# Patient Record
Sex: Female | Born: 1970 | Race: White | Hispanic: No | Smoking: Never smoker
Health system: Southern US, Community
[De-identification: ages and names within clinical notes are randomized; demographics above are authoritative.]

## PROBLEM LIST (undated history)

## (undated) DIAGNOSIS — G4733 Obstructive sleep apnea (adult) (pediatric): Principal | ICD-10-CM

## (undated) DIAGNOSIS — D219 Benign neoplasm of connective and other soft tissue, unspecified: Secondary | ICD-10-CM

## (undated) DIAGNOSIS — Z8489 Family history of other specified conditions: Secondary | ICD-10-CM

## (undated) DIAGNOSIS — Z87442 Personal history of urinary calculi: Secondary | ICD-10-CM

## (undated) DIAGNOSIS — J189 Pneumonia, unspecified organism: Secondary | ICD-10-CM

## (undated) DIAGNOSIS — K219 Gastro-esophageal reflux disease without esophagitis: Secondary | ICD-10-CM

## (undated) DIAGNOSIS — M069 Rheumatoid arthritis, unspecified: Secondary | ICD-10-CM

## (undated) DIAGNOSIS — I499 Cardiac arrhythmia, unspecified: Secondary | ICD-10-CM

## (undated) DIAGNOSIS — J309 Allergic rhinitis, unspecified: Secondary | ICD-10-CM

## (undated) DIAGNOSIS — R002 Palpitations: Secondary | ICD-10-CM

## (undated) DIAGNOSIS — E785 Hyperlipidemia, unspecified: Secondary | ICD-10-CM

## (undated) DIAGNOSIS — G43909 Migraine, unspecified, not intractable, without status migrainosus: Secondary | ICD-10-CM

## (undated) DIAGNOSIS — R55 Syncope and collapse: Secondary | ICD-10-CM

## (undated) DIAGNOSIS — J45909 Unspecified asthma, uncomplicated: Secondary | ICD-10-CM

## (undated) DIAGNOSIS — D649 Anemia, unspecified: Secondary | ICD-10-CM

## (undated) HISTORY — DX: Cardiac arrhythmia, unspecified: I49.9

## (undated) HISTORY — DX: Hyperlipidemia, unspecified: E78.5

## (undated) HISTORY — DX: Palpitations: R00.2

## (undated) HISTORY — DX: Obstructive sleep apnea (adult) (pediatric): G47.33

## (undated) HISTORY — DX: Allergic rhinitis, unspecified: J30.9

## (undated) HISTORY — DX: Gastro-esophageal reflux disease without esophagitis: K21.9

## (undated) HISTORY — DX: Syncope and collapse: R55

## (undated) HISTORY — DX: Unspecified asthma, uncomplicated: J45.909

## (undated) HISTORY — PX: WISDOM TOOTH EXTRACTION: SHX21

## (undated) HISTORY — PX: UTERINE FIBROID EMBOLIZATION: SHX825

## (undated) HISTORY — DX: Migraine, unspecified, not intractable, without status migrainosus: G43.909

---

## 1998-12-24 DIAGNOSIS — R519 Headache, unspecified: Secondary | ICD-10-CM | POA: Insufficient documentation

## 1999-01-18 DIAGNOSIS — G43019 Migraine without aura, intractable, without status migrainosus: Secondary | ICD-10-CM | POA: Insufficient documentation

## 2004-05-16 DIAGNOSIS — E119 Type 2 diabetes mellitus without complications: Secondary | ICD-10-CM | POA: Insufficient documentation

## 2012-08-19 ENCOUNTER — Encounter: Payer: Self-pay | Admitting: Internal Medicine

## 2012-08-19 ENCOUNTER — Ambulatory Visit (INDEPENDENT_AMBULATORY_CARE_PROVIDER_SITE_OTHER): Payer: BC Managed Care – PPO | Admitting: Internal Medicine

## 2012-08-19 VITALS — BP 120/72 | HR 68 | Temp 99.0°F | Ht 62.0 in | Wt 174.6 lb

## 2012-08-19 DIAGNOSIS — K219 Gastro-esophageal reflux disease without esophagitis: Secondary | ICD-10-CM | POA: Insufficient documentation

## 2012-08-19 DIAGNOSIS — E785 Hyperlipidemia, unspecified: Secondary | ICD-10-CM | POA: Insufficient documentation

## 2012-08-19 DIAGNOSIS — Z1239 Encounter for other screening for malignant neoplasm of breast: Secondary | ICD-10-CM

## 2012-08-19 DIAGNOSIS — D649 Anemia, unspecified: Secondary | ICD-10-CM

## 2012-08-19 DIAGNOSIS — Z0000 Encounter for general adult medical examination without abnormal findings: Secondary | ICD-10-CM

## 2012-08-19 DIAGNOSIS — J45909 Unspecified asthma, uncomplicated: Secondary | ICD-10-CM

## 2012-08-19 DIAGNOSIS — R002 Palpitations: Secondary | ICD-10-CM | POA: Insufficient documentation

## 2012-08-19 NOTE — Progress Notes (Signed)
  Subjective:    Patient ID: Katherine Rosario, female    DOB: December 24, 1970, 41 y.o.   MRN: [id number]  HPI  New pt to me and our practice, here to establish care - patient is here today for annual physical. Patient feels well overall.  Past Medical History  Diagnosis Date  . GERD (gastroesophageal reflux disease)   . Allergic rhinitis   . Hyperlipidemia   . Asthma   . Palpitations    Family History  Problem Relation Age of Onset  . Hyperlipidemia Mother   . Hypertension Mother   . Colon cancer Father 1  . Hyperlipidemia Father   . Hypertension Father   . Prostate cancer Father 85   History  Substance Use Topics  . Smoking status: Never Smoker   . Smokeless tobacco: Not on file     Comment: single - in long distance relationship (g-friend in [place]), employed for [employer]  . Alcohol Use: Yes    Review of Systems Constitutional: Negative for fever or weight change.  Respiratory: Negative for cough and shortness of breath.   Cardiovascular: Negative for chest pain or current palpitations.  Gastrointestinal: Negative for abdominal pain, no bowel changes.  Musculoskeletal: Negative for gait problem or joint swelling.  Skin: Negative for rash.  Neurological: Negative for dizziness or headache.  No other specific complaints in a complete review of systems (except as listed in HPI above).     Objective:   Physical Exam BP 120/72  Pulse 68  Temp 99 F (37.2 C) (Oral)  Ht 5\' 2"  (1.575 m)  Wt 174 lb 9.6 oz (79.198 kg)  BMI 31.93 kg/m2  SpO2 98% Wt Readings from Last 3 Encounters:  08/19/12 174 lb 9.6 oz (79.198 kg)   Constitutional: She is overweight, but appears well-developed and well-nourished. No distress.  HENT: Head: Normocephalic and atraumatic. Ears: B TMs ok, no erythema or effusion; Nose: Nose normal. Mouth/Throat: Oropharynx is clear and moist. No oropharyngeal exudate.  Eyes: Conjunctivae and EOM are normal. Pupils are equal, round, and reactive to light.  No scleral icterus.  Neck: Normal range of motion. Neck supple. No JVD present. No thyromegaly present.  Cardiovascular: Normal rate, regular rhythm and normal heart sounds.  No murmur heard. No BLE edema. Pulmonary/Chest: Effort normal and breath sounds normal. No respiratory distress. She has no wheezes.  Abdominal: Soft. Bowel sounds are normal. She exhibits no distension. There is no tenderness. no masses Musculoskeletal: Normal range of motion, no joint effusions. No gross deformities Neurological: She is alert and oriented to person, place, and time. No cranial nerve deficit. Coordination normal.  Skin: Skin is warm and dry. No rash noted. No erythema.  Psychiatric: She has a normal mood and affect. Her behavior is normal. Judgment and thought content normal.   No results found for this basename: WBC,  HGB,  HCT,  PLT,  GLUCOSE,  CHOL,  TRIG,  HDL,  LDLDIRECT,  LDLCALC,  ALT,  AST,  NA,  K,  CL,  CREATININE,  BUN,  CO2,  TSH,  PSA,  INR,  GLUF,  HGBA1C,  MICROALBUR       Assessment & Plan:   CPX/v70.0 - Patient has been counseled on age-appropriate routine health concerns for screening and prevention. These are reviewed and up-to-date. Immunizations are up-to-date or declined. Labs ordered and will be reviewed.

## 2012-08-19 NOTE — Patient Instructions (Addendum)
It was good to see you today. We have reviewed your prior medical history today we will send to your prior provider(s) for "release of records" as discussed today -  Health Maintenance reviewed - all recommended immunizations and age-appropriate screenings are up-to-date. we'll make referral for mammogram . Our office will contact you regarding appointment(s) once made. Medications reviewed, no changes at this time. Please schedule followup in 6 months, call sooner if problems. Work on lifestyle changes as discussed (low fat, low carb, increased protein diet; improved exercise efforts; weight loss) to control sugar, blood pressure and cholesterol levels and/or reduce risk of developing other medical problems. Look into [email or web address] or other type of food journal to assist you in this process.  Health Maintenance, Females A healthy lifestyle and preventative care can promote health and wellness.  Maintain regular health, dental, and eye exams.   Eat a healthy diet. Foods like vegetables, fruits, whole grains, low-fat dairy products, and lean protein foods contain the nutrients you need without too many calories. Decrease your intake of foods high in solid fats, added sugars, and salt. Get information about a proper diet from your caregiver, if necessary.   Regular physical exercise is one of the most important things you can do for your health. Most adults should get at least 150 minutes of moderate-intensity exercise (any activity that increases your heart rate and causes you to sweat) each week. In addition, most adults need muscle-strengthening exercises on 2 or more days a week.     Maintain a healthy weight. The body mass index (BMI) is a screening tool to identify possible weight problems. It provides an estimate of body fat based on height and weight. Your caregiver can help determine your BMI, and can help you achieve or maintain a healthy weight. For adults 20 years and older:   A BMI  below 18.5 is considered underweight.   A BMI of 18.5 to 24.9 is normal.   A BMI of 25 to 29.9 is considered overweight.   A BMI of 30 and above is considered obese.   Maintain normal blood lipids and cholesterol by exercising and minimizing your intake of saturated fat. Eat a balanced diet with plenty of fruits and vegetables. Blood tests for lipids and cholesterol should begin at age 39 and be repeated every 5 years. If your lipid or cholesterol levels are high, you are over 50, or you are a high risk for heart disease, you may need your cholesterol levels checked more frequently. Ongoing high lipid and cholesterol levels should be treated with medicines if diet and exercise are not effective.   If you smoke, find out from your caregiver how to quit. If you do not use tobacco, do not start.   If you are pregnant, do not drink alcohol. If you are breastfeeding, be very cautious about drinking alcohol. If you are not pregnant and choose to drink alcohol, do not exceed 1 drink per day. One drink is considered to be 12 ounces (355 mL) of beer, 5 ounces (148 mL) of wine, or 1.5 ounces (44 mL) of liquor.   Avoid use of street drugs. Do not share needles with anyone. Ask for help if you need support or instructions about stopping the use of drugs.   High blood pressure causes heart disease and increases the risk of stroke. Blood pressure should be checked at least every 1 to 2 years. Ongoing high blood pressure should be treated with medicines, if weight loss and exercise are  not effective.   If you are 66 to 43 years old, ask your caregiver if you should take aspirin to prevent strokes.   Diabetes screening involves taking a blood sample to check your fasting blood sugar level. This should be done once every 3 years, after age 52, if you are within normal weight and without risk factors for diabetes. Testing should be considered at a younger age or be carried out more frequently if you are overweight  and have at least 1 risk factor for diabetes.   Breast cancer screening is essential preventative care for women. You should practice "breast self-awareness." This means understanding the normal appearance and feel of your breasts and may include breast self-examination. Any changes detected, no matter how small, should be reported to a caregiver. Women in their 80s and 30s should have a clinical breast exam (CBE) by a caregiver as part of a regular health exam every 1 to 3 years. After age 28, women should have a CBE every year. Starting at age 57, women should consider having a mammogram (breast X-ray) every year. Women who have a family history of breast cancer should talk to their caregiver about genetic screening. Women at a high risk of breast cancer should talk to their caregiver about having an MRI and a mammogram every year.   The Pap test is a screening test for cervical cancer. Women should have a Pap test starting at age 11. Between ages 82 and 1, Pap tests should be repeated every 2 years. Beginning at age 81, you should have a Pap test every 3 years as long as the past 3 Pap tests have been normal. If you had a hysterectomy for a problem that was not cancer or a condition that could lead to cancer, then you no longer need Pap tests. If you are between ages 5 and 74, and you have had normal Pap tests going back 10 years, you no longer need Pap tests. If you have had past treatment for cervical cancer or a condition that could lead to cancer, you need Pap tests and screening for cancer for at least 20 years after your treatment. If Pap tests have been discontinued, risk factors (such as a new sexual partner) need to be reassessed to determine if screening should be resumed. Some women have medical problems that increase the chance of getting cervical cancer. In these cases, your caregiver may recommend more frequent screening and Pap tests.   The human papillomavirus (HPV) test is an additional  test that may be used for cervical cancer screening. The HPV test looks for the virus that can cause the cell changes on the cervix. The cells collected during the Pap test can be tested for HPV. The HPV test could be used to screen women aged 41 years and older, and should be used in women of any age who have unclear Pap test results. After the age of 52, women should have HPV testing at the same frequency as a Pap test.   Colorectal cancer can be detected and often prevented. Most routine colorectal cancer screening begins at the age of 21 and continues through age 37. However, your caregiver may recommend screening at an earlier age if you have risk factors for colon cancer. On a yearly basis, your caregiver may provide home test kits to check for hidden blood in the stool. Use of a small camera at the end of a tube, to directly examine the colon (sigmoidoscopy or colonoscopy), can detect  the earliest forms of colorectal cancer. Talk to your caregiver about this at age 65, when routine screening begins. Direct examination of the colon should be repeated every 5 to 10 years through age 56, unless early forms of pre-cancerous polyps or small growths are found.   Hepatitis C blood testing is recommended for all people born from [birth year] through 1965 and any individual with known risks for hepatitis C.   Practice safe sex. Use condoms and avoid high-risk sexual practices to reduce the spread of sexually transmitted infections (STIs). Sexually active women aged 40 and younger should be checked for Chlamydia, which is a common sexually transmitted infection. Older women with new or multiple partners should also be tested for Chlamydia. Testing for other STIs is recommended if you are sexually active and at increased risk.   Osteoporosis is a disease in which the bones lose minerals and strength with aging. This can result in serious bone fractures. The risk of osteoporosis can be identified using a bone density  scan. Women ages 57 and over and women at risk for fractures or osteoporosis should discuss screening with their caregivers. Ask your caregiver whether you should be taking a calcium supplement or vitamin D to reduce the rate of osteoporosis.   Menopause can be associated with physical symptoms and risks. Hormone replacement therapy is available to decrease symptoms and risks. You should talk to your caregiver about whether hormone replacement therapy is right for you.   Use sunscreen with a sun protection factor (SPF) of 30 or greater. Apply sunscreen liberally and repeatedly throughout the day. You should seek shade when your shadow is shorter than you. Protect yourself by wearing long sleeves, pants, a wide-brimmed hat, and sunglasses year round, whenever you are outdoors.   Notify your caregiver of new moles or changes in moles, especially if there is a change in shape or color. Also notify your caregiver if a mole is larger than the size of a pencil eraser.   Stay current with your immunizations.  Document Released: 03/06/2011 Document Revised: 11/13/2011 Document Reviewed: 03/06/2011 [facility] Patient Information 2013 [place], [place].   Exercise to Lose Weight Exercise and a healthy diet may help you lose weight. Your doctor may suggest specific exercises. EXERCISE IDEAS AND TIPS  Choose low-cost things you enjoy doing, such as walking, bicycling, or exercising to workout videos.   Take stairs instead of the elevator.   Walk during your lunch break.   Park your car further away from work or school.   Go to a gym or an exercise class.   Start with 5 to 10 minutes of exercise each day. Build up to 30 minutes of exercise 4 to 6 days a week.   Wear shoes with good support and comfortable clothes.   Stretch before and after working out.   Work out until you breathe harder and your heart beats faster.   Drink extra water when you exercise.   Do not do so much that you hurt yourself,  feel dizzy, or get very short of breath.  Exercises that burn about 150 calories:  Running 1  miles in 15 minutes.   Playing volleyball for 45 to 60 minutes.   Washing and waxing a car for 45 to 60 minutes.   Playing touch football for 45 minutes.   Walking 1  miles in 35 minutes.   Pushing a stroller 1  miles in 30 minutes.   Playing basketball for 30 minutes.   Raking leaves for  30 minutes.   Bicycling 5 miles in 30 minutes.   Walking 2 miles in 30 minutes.   Dancing for 30 minutes.   Shoveling snow for 15 minutes.   Swimming laps for 20 minutes.   Walking up stairs for 15 minutes.   Bicycling 4 miles in 15 minutes.   Gardening for 30 to 45 minutes.   Jumping rope for 15 minutes.   Washing windows or floors for 45 to 60 minutes.  Document Released: 09/23/2010 Document Revised: 11/13/2011 Document Reviewed: 09/23/2010 [facility] Patient Information 2013 [place], [place].

## 2012-08-19 NOTE — Assessment & Plan Note (Signed)
Intermittent symptoms >10 years - Reports several prior Holter monitors and echo done for same in [place], none in past 3 years, "nothing ever found" On beta-blocker for same - few episodes at this time Will send for records and consider additional cardiac eval depending on recurrence of symptoms and prior results

## 2012-08-19 NOTE — Assessment & Plan Note (Signed)
Never on statin but strong FH - both parents on meds Would consider statin if needed - check lipids now The patient is asked to make an attempt to improve diet and exercise patterns to aid in medical management of this problem.

## 2012-08-19 NOTE — Assessment & Plan Note (Signed)
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

## 2012-08-20 ENCOUNTER — Encounter: Payer: Self-pay | Admitting: Internal Medicine

## 2012-08-23 ENCOUNTER — Encounter: Payer: Self-pay | Admitting: Internal Medicine

## 2012-08-23 ENCOUNTER — Other Ambulatory Visit (INDEPENDENT_AMBULATORY_CARE_PROVIDER_SITE_OTHER): Payer: BC Managed Care – PPO

## 2012-08-23 DIAGNOSIS — D649 Anemia, unspecified: Secondary | ICD-10-CM | POA: Insufficient documentation

## 2012-08-23 DIAGNOSIS — Z0000 Encounter for general adult medical examination without abnormal findings: Secondary | ICD-10-CM

## 2012-08-23 LAB — CBC WITH DIFFERENTIAL/PLATELET
Basophils Relative: 1.6 % (ref 0.0–3.0)
Eosinophils Absolute: 0.2 10*3/uL (ref 0.0–0.7)
Lymphs Abs: 2 10*3/uL (ref 0.7–4.0)
MCHC: 32.4 g/dL (ref 30.0–36.0)
MCV: 75.1 fl — ABNORMAL LOW (ref 78.0–100.0)
Monocytes Absolute: 0.6 10*3/uL (ref 0.1–1.0)
Neutro Abs: 3.9 10*3/uL (ref 1.4–7.7)
Neutrophils Relative %: 57.1 % (ref 43.0–77.0)
RBC: 4.06 Mil/uL (ref 3.87–5.11)

## 2012-08-23 LAB — URINALYSIS, ROUTINE W REFLEX MICROSCOPIC
Bilirubin Urine: NEGATIVE
Ketones, ur: NEGATIVE
Urine Glucose: NEGATIVE
Urobilinogen, UA: 0.2 (ref 0.0–1.0)

## 2012-08-23 LAB — BASIC METABOLIC PANEL
CO2: 26 mEq/L (ref 19–32)
Chloride: 106 mEq/L (ref 96–112)
Creatinine, Ser: 0.9 mg/dL (ref 0.4–1.2)

## 2012-08-23 LAB — HEPATIC FUNCTION PANEL
Albumin: 3.8 g/dL (ref 3.5–5.2)
Alkaline Phosphatase: 59 U/L (ref 39–117)
Bilirubin, Direct: 0 mg/dL (ref 0.0–0.3)

## 2012-08-23 LAB — LIPID PANEL
LDL Cholesterol: 137 mg/dL — ABNORMAL HIGH (ref 0–99)
Total CHOL/HDL Ratio: 5
Triglycerides: 120 mg/dL (ref 0.0–149.0)

## 2012-08-23 MED ORDER — FERROUS SULFATE 325 (65 FE) MG PO TABS: 325.0000 mg | ORAL_TABLET | Freq: Two times a day (BID) | ORAL | Status: DC

## 2012-08-23 NOTE — Addendum Note (Signed)
Addended by: Rene Paci A on: 08/23/2012 12:23 PM   Modules accepted: Orders

## 2012-08-26 ENCOUNTER — Encounter: Payer: Self-pay | Admitting: Internal Medicine

## 2012-09-10 ENCOUNTER — Other Ambulatory Visit (INDEPENDENT_AMBULATORY_CARE_PROVIDER_SITE_OTHER): Payer: BC Managed Care – PPO

## 2012-09-10 DIAGNOSIS — D649 Anemia, unspecified: Secondary | ICD-10-CM

## 2012-09-10 LAB — CBC WITH DIFFERENTIAL/PLATELET
Basophils Absolute: 0.1 10*3/uL (ref 0.0–0.1)
Basophils Relative: 1.4 % (ref 0.0–3.0)
Eosinophils Absolute: 0.2 10*3/uL (ref 0.0–0.7)
Hemoglobin: 11.4 g/dL — ABNORMAL LOW (ref 12.0–15.0)
Lymphocytes Relative: 34.5 % (ref 12.0–46.0)
MCHC: 32.4 g/dL (ref 30.0–36.0)
Monocytes Relative: 8.8 % (ref 3.0–12.0)
Neutrophils Relative %: 52.3 % (ref 43.0–77.0)
RBC: 4.31 Mil/uL (ref 3.87–5.11)
WBC: 8.1 10*3/uL (ref 4.5–10.5)

## 2012-09-10 LAB — FERRITIN: Ferritin: 20 ng/mL (ref 10.0–291.0)

## 2012-10-07 ENCOUNTER — Ambulatory Visit
Admission: RE | Admit: 2012-10-07 | Discharge: 2012-10-07 | Disposition: A | Discharge status: Home / Self Care | Payer: BC Managed Care – PPO | Source: Ambulatory Visit | Attending: Internal Medicine | Admitting: Internal Medicine

## 2012-10-07 DIAGNOSIS — Z1239 Encounter for other screening for malignant neoplasm of breast: Secondary | ICD-10-CM

## 2012-11-18 ENCOUNTER — Other Ambulatory Visit: Payer: Self-pay | Admitting: Obstetrics and Gynecology

## 2012-11-18 DIAGNOSIS — N920 Excessive and frequent menstruation with regular cycle: Secondary | ICD-10-CM

## 2012-11-18 DIAGNOSIS — D259 Leiomyoma of uterus, unspecified: Secondary | ICD-10-CM

## 2012-11-20 ENCOUNTER — Ambulatory Visit
Admission: RE | Admit: 2012-11-20 | Discharge: 2012-11-20 | Disposition: A | Discharge status: Home / Self Care | Payer: BC Managed Care – PPO | Source: Ambulatory Visit | Attending: Obstetrics and Gynecology | Admitting: Obstetrics and Gynecology

## 2012-11-20 DIAGNOSIS — D259 Leiomyoma of uterus, unspecified: Secondary | ICD-10-CM

## 2012-11-20 DIAGNOSIS — N920 Excessive and frequent menstruation with regular cycle: Secondary | ICD-10-CM

## 2012-11-20 HISTORY — DX: Benign neoplasm of connective and other soft tissue, unspecified: D21.9

## 2012-11-20 HISTORY — DX: Anemia, unspecified: D64.9

## 2012-11-20 NOTE — Progress Notes (Signed)
Patient ID: Katherine Rosario, female   DOB: [birth year], 42 y.o.   MRN: [id number] Patient is a very pleasant 42 year old female with a long history of irregular menstrual cycles.  She says the irregular cycles have been lasting for approximately 8 years.  She has been experiencing heavy menstrual flow with clots for the past three years.  She also complains of abdominal pressure and pelvic pressure, particularly along the right side.  She says that during her heavy menstrual bleeding, she finds it difficult to even leave the house due to the discomfort and frequent bleeding.  She has tried oral contraceptive pills in the past which did not give her symptomatic relief and did not regulate her cycles.  She is on over-the-counter iron for anemia.  She has never had any gynecologic surgery.  The patient has also never been pregnant.  Past Medical History: Significant for asthma and heart palpitations/tachycardia.    Medications:  She takes atenolol 50 mg daily, Zyrtec 10 mg daily, one iron tablet two times a day, omeprazole 20 mg a day, montelukast 10 mg at bed time.    Allergies:  She has no known drug allergies.    Social History:  She lives in [place] and works for [employer].  She denies tobacco or alcohol abuse.  She consumes approximately 20 caffeinated beverages a week.  She does not use recreational drugs.  Family History:  Significant for colon and prostate cancer in her father.  Both her mother and father have high blood pressure.  Review of Systems: She complains of fatigue and hay fever.  Genitourinary system is significant only for the heavy menstrual periods.  Cardiovascular is significant for tachycardia which is now controlled with atenolol.  She denies musculoskeletal problems.  She has had some lightheadedness and dizziness in the past.  Pulmonary is significant for asthma.  Physical Examination:  Vital signs today:  Temperature 98.4, blood pressure 113/72, pulse 68,  respirations 14.  Oxygen saturation is 97% on room air.  She is 5'2" tall and weighs 170 Page 2 Re:  [name] (DOB: 2071/05/19)  pounds.  Calculated BMI is 31.2.  General:  Healthy-appearing but overweight female.  Heart:  Regular rate and rhythm.  Lungs:  Clear to auscultation.  Abdomen:  Soft but the patient is tender to deep palpation in the lower abdomen and upper pelvis.  The uterus is palpated below the umbilicus.  Lower extremities:  Palpable groin pulses bilaterally.  Unable to palpate pedal pulses. The posterior tibial artery is identified with Doppler but unable to be palpated.  She has had both toenails removed.  No discoloration or ulcerations in the feet.   Laboratory:  She had a Pap smear on 10/29/12 which was negative for intraepithelial lesion or malignancies.  Candida species were identified.  Labs from 09/10/12 demonstrated white count of 8.1, hemoglobin 11.4, platelets 346.  Labs from 08/23/13 demonstrated BUN of 12 and creatinine of 0.9.  Cholesterol 200, LDL 137, HDL 38.9.  Ultrasound exam with sonohysterography was performed on 11/13/12 and demonstrated an enlarged uterus with multiple fibroids, simple cysts in the right ovary.  Post saline injection images show no intracavitary mass but there is a posterior fibroid abutting and displacing the endometrium.   Assessment:  42 year old female with uterine fibroids by ultrasound and menorrhagia. The menstrual bleeding is irregular and the bleeding is so heavy at times that it is lifestyle-limiting.  We discussed treatment options for uterine fibroids, including hysterectomy, myomectomy, hormonal therapy and uterine artery embolization.  We discussed the uterine artery embolization procedure in depth.  We discussed the risks, benefits and expected recovery.  I believe the patient has a good understanding of the procedure and risks.  I believe the patient would be a good candidate for uterine artery embolization based on her symptoms and past  medical history.  She is very interested in pursuing this procedure.  We will plan to get an MRI of the pelvis to make sure that she is a candidate for the procedure.  Following the MRI of the pelvis, we will schedule the patient for uterine artery embolization based on the patient's convenience and schedule.

## 2012-11-28 ENCOUNTER — Other Ambulatory Visit: Payer: BC Managed Care – PPO

## 2012-11-29 ENCOUNTER — Ambulatory Visit
Admission: RE | Admit: 2012-11-29 | Discharge: 2012-11-29 | Disposition: A | Discharge status: Home / Self Care | Payer: BC Managed Care – PPO | Source: Ambulatory Visit | Attending: Obstetrics and Gynecology | Admitting: Obstetrics and Gynecology

## 2012-11-29 DIAGNOSIS — N920 Excessive and frequent menstruation with regular cycle: Secondary | ICD-10-CM

## 2012-11-29 DIAGNOSIS — D259 Leiomyoma of uterus, unspecified: Secondary | ICD-10-CM

## 2012-11-29 MED ORDER — GADOBENATE DIMEGLUMINE 529 MG/ML IV SOLN
15.0000 mL | Freq: Once | INTRAVENOUS | Status: AC | PRN
  Administered 2012-11-29: 15 mL via INTRAVENOUS

## 2012-12-02 ENCOUNTER — Telehealth: Payer: Self-pay | Admitting: Emergency Medicine

## 2012-12-03 ENCOUNTER — Telehealth: Payer: Self-pay | Admitting: Emergency Medicine

## 2012-12-03 NOTE — Telephone Encounter (Signed)
DONE

## 2012-12-03 NOTE — Telephone Encounter (Signed)
LM FOR PT TO CALL BACK- ALSO LM ON HOME#  [id number]- PT CALLED BACK AND I MADE HER AWARE THAT DR HENN REVIEWED THE MRI AND LOOKS GOOD FOR THE [place] PROCEDURE.   PT ALSO STATED THAT SHE IS SCHEDULED FOR AN EBX 12-04-12.  SHE IS GOOD TO GO AND I WILL GO AHEAD AND SUBMIT HER INFO TO INS. TODAY AND WILL HAVE TINA AT [facility] TO CALL HER TO SET UP THE DATE/TIME.

## 2012-12-04 ENCOUNTER — Other Ambulatory Visit (HOSPITAL_COMMUNITY): Payer: Self-pay | Admitting: Diagnostic Radiology

## 2012-12-04 DIAGNOSIS — D259 Leiomyoma of uterus, unspecified: Secondary | ICD-10-CM

## 2012-12-04 DIAGNOSIS — N920 Excessive and frequent menstruation with regular cycle: Secondary | ICD-10-CM

## 2012-12-10 ENCOUNTER — Telehealth: Payer: Self-pay | Admitting: Emergency Medicine

## 2012-12-10 NOTE — Telephone Encounter (Signed)
CALLED PT TO MAKE HER AWARE THAT THE EBX WAS GOOD FOR [place] PROCEDURE AND NO PRECERT WAS REQUIRED WITH HER INS.  PT STATED THAT SHE WANTED TO SCHEDULE THIS FOR 02-07-13-  TINA AT [facility] TO CONTACT PT.

## 2012-12-24 LAB — HM COLONOSCOPY: HM Colonoscopy: NORMAL

## 2013-01-17 ENCOUNTER — Encounter: Payer: Self-pay | Admitting: Internal Medicine

## 2013-01-20 ENCOUNTER — Telehealth: Payer: Self-pay | Admitting: Emergency Medicine

## 2013-01-20 NOTE — Telephone Encounter (Signed)
LMOVM FOR PT. TO MAKE HER AWARE THAT FMLA FORMS ARE COMPLETE AND I WILL MAIL HER A COPY AND FAX A COPY TO THE [employer] TODAY.   CALL IF ANY ?'S.

## 2013-01-30 ENCOUNTER — Encounter (HOSPITAL_COMMUNITY): Payer: Self-pay | Admitting: Pharmacy Technician

## 2013-02-03 ENCOUNTER — Other Ambulatory Visit: Payer: Self-pay | Admitting: Radiology

## 2013-02-06 ENCOUNTER — Other Ambulatory Visit (HOSPITAL_COMMUNITY): Payer: Self-pay | Admitting: Diagnostic Radiology

## 2013-02-06 DIAGNOSIS — N920 Excessive and frequent menstruation with regular cycle: Secondary | ICD-10-CM

## 2013-02-06 DIAGNOSIS — D259 Leiomyoma of uterus, unspecified: Secondary | ICD-10-CM

## 2013-02-07 ENCOUNTER — Encounter (HOSPITAL_COMMUNITY): Payer: Self-pay

## 2013-02-07 ENCOUNTER — Observation Stay (HOSPITAL_COMMUNITY)
Admission: RE | Admit: 2013-02-07 | Discharge: 2013-02-08 | Disposition: A | Discharge status: Home / Self Care | Payer: BC Managed Care – PPO | Source: Ambulatory Visit | Attending: Diagnostic Radiology | Admitting: Diagnostic Radiology

## 2013-02-07 ENCOUNTER — Ambulatory Visit (HOSPITAL_COMMUNITY)
Admission: RE | Admit: 2013-02-07 | Discharge: 2013-02-07 | Disposition: A | Discharge status: Home / Self Care | Payer: BC Managed Care – PPO | Source: Ambulatory Visit | Attending: Diagnostic Radiology | Admitting: Diagnostic Radiology

## 2013-02-07 VITALS — BP 114/60 | HR 69 | Temp 98.9°F | Resp 18 | Ht 62.0 in | Wt 170.0 lb

## 2013-02-07 DIAGNOSIS — K219 Gastro-esophageal reflux disease without esophagitis: Secondary | ICD-10-CM

## 2013-02-07 DIAGNOSIS — R002 Palpitations: Secondary | ICD-10-CM

## 2013-02-07 DIAGNOSIS — J45909 Unspecified asthma, uncomplicated: Secondary | ICD-10-CM | POA: Insufficient documentation

## 2013-02-07 DIAGNOSIS — R110 Nausea: Secondary | ICD-10-CM | POA: Insufficient documentation

## 2013-02-07 DIAGNOSIS — D649 Anemia, unspecified: Secondary | ICD-10-CM

## 2013-02-07 DIAGNOSIS — Z79899 Other long term (current) drug therapy: Secondary | ICD-10-CM | POA: Insufficient documentation

## 2013-02-07 DIAGNOSIS — D259 Leiomyoma of uterus, unspecified: Principal | ICD-10-CM

## 2013-02-07 DIAGNOSIS — N949 Unspecified condition associated with female genital organs and menstrual cycle: Secondary | ICD-10-CM | POA: Insufficient documentation

## 2013-02-07 DIAGNOSIS — E785 Hyperlipidemia, unspecified: Secondary | ICD-10-CM

## 2013-02-07 DIAGNOSIS — R109 Unspecified abdominal pain: Secondary | ICD-10-CM | POA: Insufficient documentation

## 2013-02-07 DIAGNOSIS — N920 Excessive and frequent menstruation with regular cycle: Secondary | ICD-10-CM

## 2013-02-07 LAB — BASIC METABOLIC PANEL
BUN: 12 mg/dL (ref 6–23)
CO2: 23 mEq/L (ref 19–32)
Calcium: 8.8 mg/dL (ref 8.4–10.5)
Creatinine, Ser: 0.77 mg/dL (ref 0.50–1.10)
GFR calc non Af Amer: >90 mL/min (ref >90)
Glucose, Bld: 96 mg/dL (ref 70–99)

## 2013-02-07 LAB — CBC
MCH: 31.8 pg (ref 26.0–34.0)
MCHC: 34 g/dL (ref 30.0–36.0)
MCV: 93.3 fL (ref 78.0–100.0)
Platelets: 342 10*3/uL (ref 150–400)

## 2013-02-07 LAB — HCG, SERUM, QUALITATIVE: Preg, Serum: NEGATIVE

## 2013-02-07 MED ORDER — DIPHENHYDRAMINE HCL 12.5 MG/5ML PO ELIX
12.5000 mg | ORAL_SOLUTION | Freq: Four times a day (QID) | ORAL | Status: DC | PRN
  Filled 2013-02-07: qty 5

## 2013-02-07 MED ORDER — SODIUM CHLORIDE 0.9 % IV SOLN
INTRAVENOUS | Status: DC
  Administered 2013-02-07: 08:00:00 via INTRAVENOUS

## 2013-02-07 MED ORDER — SODIUM CHLORIDE 0.9 % IJ SOLN: 3.0000 mL | INTRAMUSCULAR | Status: DC | PRN

## 2013-02-07 MED ORDER — ONDANSETRON HCL 4 MG/2ML IJ SOLN
4.0000 mg | Freq: Four times a day (QID) | INTRAMUSCULAR | Status: DC | PRN
  Administered 2013-02-07: 4 mg via INTRAVENOUS
  Filled 2013-02-07: qty 2

## 2013-02-07 MED ORDER — HYDROMORPHONE 0.3 MG/ML IV SOLN
INTRAVENOUS | Status: DC
  Administered 2013-02-07: 1.8 mg via INTRAVENOUS
  Administered 2013-02-07: 0.9 mg via INTRAVENOUS
  Administered 2013-02-07: 0.6 mg via INTRAVENOUS
  Administered 2013-02-07: 10:00:00 via INTRAVENOUS
  Administered 2013-02-08 (×2): 0.6 mg via INTRAVENOUS
  Filled 2013-02-07: qty 25

## 2013-02-07 MED ORDER — KETOROLAC TROMETHAMINE 30 MG/ML IJ SOLN
30.0000 mg | INTRAMUSCULAR | Status: AC
  Administered 2013-02-07: 30 mg via INTRAVENOUS
  Filled 2013-02-07: qty 1

## 2013-02-07 MED ORDER — KETOROLAC TROMETHAMINE 30 MG/ML IJ SOLN
30.0000 mg | Freq: Four times a day (QID) | INTRAMUSCULAR | Status: DC
  Administered 2013-02-07 – 2013-02-08 (×4): 30 mg via INTRAVENOUS
  Filled 2013-02-07 (×8): qty 1

## 2013-02-07 MED ORDER — LIDOCAINE HCL 1 % IJ SOLN
INTRAMUSCULAR | Status: AC
  Filled 2013-02-07: qty 20

## 2013-02-07 MED ORDER — DIPHENHYDRAMINE HCL 50 MG/ML IJ SOLN: 12.5000 mg | Freq: Four times a day (QID) | INTRAMUSCULAR | Status: DC | PRN

## 2013-02-07 MED ORDER — FENTANYL CITRATE 0.05 MG/ML IJ SOLN
INTRAMUSCULAR | Status: AC | PRN
  Administered 2013-02-07 (×2): 25 ug via INTRAVENOUS
  Administered 2013-02-07 (×2): 50 ug via INTRAVENOUS
  Administered 2013-02-07 (×2): 25 ug via INTRAVENOUS

## 2013-02-07 MED ORDER — CEFAZOLIN SODIUM-DEXTROSE 2-3 GM-% IV SOLR
2.0000 g | INTRAVENOUS | Status: AC
  Administered 2013-02-07: 2 g via INTRAVENOUS
  Filled 2013-02-07: qty 50

## 2013-02-07 MED ORDER — HYDROMORPHONE HCL PF 2 MG/ML IJ SOLN
INTRAMUSCULAR | Status: AC
  Filled 2013-02-07: qty 1

## 2013-02-07 MED ORDER — ATENOLOL 50 MG PO TABS
50.0000 mg | ORAL_TABLET | Freq: Every morning | ORAL | Status: DC
  Filled 2013-02-07 (×2): qty 1

## 2013-02-07 MED ORDER — PANTOPRAZOLE SODIUM 40 MG PO TBEC
40.0000 mg | DELAYED_RELEASE_TABLET | Freq: Every day | ORAL | Status: DC
  Administered 2013-02-07: 40 mg via ORAL
  Filled 2013-02-07 (×2): qty 1

## 2013-02-07 MED ORDER — PROMETHAZINE HCL 25 MG RE SUPP: 25.0000 mg | Freq: Three times a day (TID) | RECTAL | Status: DC | PRN

## 2013-02-07 MED ORDER — SODIUM CHLORIDE 0.9 % IV SOLN: 250.0000 mL | INTRAVENOUS | Status: DC | PRN

## 2013-02-07 MED ORDER — FENTANYL CITRATE 0.05 MG/ML IJ SOLN
INTRAMUSCULAR | Status: AC
  Filled 2013-02-07: qty 6

## 2013-02-07 MED ORDER — DOCUSATE SODIUM 100 MG PO CAPS
100.0000 mg | ORAL_CAPSULE | Freq: Two times a day (BID) | ORAL | Status: DC
  Administered 2013-02-07: 100 mg via ORAL
  Filled 2013-02-07 (×4): qty 1

## 2013-02-07 MED ORDER — ALBUTEROL SULFATE HFA 108 (90 BASE) MCG/ACT IN AERS: 2.0000 | INHALATION_SPRAY | RESPIRATORY_TRACT | Status: DC | PRN

## 2013-02-07 MED ORDER — SODIUM CHLORIDE 0.9 % IJ SOLN: 3.0000 mL | Freq: Two times a day (BID) | INTRAMUSCULAR | Status: DC

## 2013-02-07 MED ORDER — MONTELUKAST SODIUM 10 MG PO TABS
10.0000 mg | ORAL_TABLET | Freq: Every morning | ORAL | Status: DC
  Administered 2013-02-07: 10 mg via ORAL
  Filled 2013-02-07 (×2): qty 1

## 2013-02-07 MED ORDER — IOHEXOL 300 MG/ML  SOLN
125.0000 mL | Freq: Once | INTRAMUSCULAR | Status: AC | PRN
  Administered 2013-02-07: 1 mL via INTRA_ARTERIAL

## 2013-02-07 MED ORDER — SODIUM CHLORIDE 0.9 % IJ SOLN: 9.0000 mL | INTRAMUSCULAR | Status: DC | PRN

## 2013-02-07 MED ORDER — MIDAZOLAM HCL 2 MG/2ML IJ SOLN
INTRAMUSCULAR | Status: AC
  Filled 2013-02-07: qty 6

## 2013-02-07 MED ORDER — PROMETHAZINE HCL 25 MG PO TABS: 25.0000 mg | ORAL_TABLET | Freq: Three times a day (TID) | ORAL | Status: DC | PRN

## 2013-02-07 MED ORDER — MIDAZOLAM HCL 2 MG/2ML IJ SOLN
INTRAMUSCULAR | Status: AC | PRN
  Administered 2013-02-07 (×3): 0.5 mg via INTRAVENOUS
  Administered 2013-02-07: 1 mg via INTRAVENOUS
  Administered 2013-02-07: 0.5 mg via INTRAVENOUS
  Administered 2013-02-07: 1 mg via INTRAVENOUS

## 2013-02-07 MED ORDER — NALOXONE HCL 0.4 MG/ML IJ SOLN: 0.4000 mg | INTRAMUSCULAR | Status: DC | PRN

## 2013-02-07 NOTE — ED Notes (Signed)
Gause/Tegaderm bandage applied to R Fem Artery puncture CDI.

## 2013-02-07 NOTE — Progress Notes (Signed)
  Subjective: Pt drowsy; c/o intermittent pelvic cramping/nausea  Objective: Vital signs in last 24 hours: Temp:  [97 F (36.1 C)-97.9 F (36.6 C)] 97.9 F (36.6 C) (06/06 1404) Pulse Rate:  [45-67] 45 (06/06 1404) Resp:  [8-16] 13 (06/06 1404) BP: (101-169)/(60-95) 142/79 mmHg (06/06 1404) SpO2:  [96 %-100 %] 100 % (06/06 1404) FiO2 (%):  [100 %] 100 % (06/06 1300) Weight:  [170 lb (77.111 kg)] 170 lb (77.111 kg) (06/06 1300)    Intake/Output from previous day:   Intake/Output this shift:    Pt awake but drowsy ; chest- CTA bilat; heart- brady but regular rhythm, abd- soft,+BS, tender pelvic region, fibroid uterus; ext- no edema; puncture site rt groin clean, soft,NT, no hematoma; palpable distal pulses  Lab Results:   Recent Labs  02/07/13 0800  WBC 7.1  HGB 12.8  HCT 37.6  PLT 342   BMET  Recent Labs  02/07/13 0800  NA 136  K 3.8  CL 104  CO2 23  GLUCOSE 96  BUN 12  CREATININE 0.77  CALCIUM 8.8   PT/INR  Recent Labs  02/07/13 0800  LABPROT 12.8  INR 0.97   ABG No results found for this basename: PHART, PCO2, PO2, HCO3,  in the last 72 hours  Studies/Results: No results found.  Anti-infectives: Anti-infectives   Start     Dose/Rate Route Frequency Ordered Stop   02/07/13 0747  ceFAZolin (ANCEF) IVPB 2 g/50 mL premix     2 g 100 mL/hr over 30 Minutes Intravenous On call 02/07/13 0747 02/07/13 1055      Assessment/Plan: s/p bilateral uterine artery embolization 6/6 for symptomatic uterine fibroids: for overnight obs for pain control; zofran for nausea; IV hydration; advance diet as tolerated; d/c foley cath; f/u with Dr. Lowella Dandy in 2 weeks in IR clinic  LOS: 0 days    ALLRED,D [facility] 02/07/2013

## 2013-02-07 NOTE — H&P (Signed)
Katherine Rosario is an 42 y.o. female.   Chief Complaint: fibroids, heavy menstrual bleeding HPI: Patient with history of symptomatic uterine fibroids presents today for bilateral uterine artery embolization.  Past Medical History  Diagnosis Date  . GERD (gastroesophageal reflux disease)   . Allergic rhinitis   . Hyperlipidemia   . Asthma   . Palpitations   . Heart palpitations   . Fibroids   . Anemia     Heavy menstrual flow/OTC FE BID:      Past Surgical History  Procedure Laterality Date  . No past surgeries      Family History  Problem Relation Age of Onset  . Hyperlipidemia Mother   . Hypertension Mother   . Colon cancer Father 75  . Hyperlipidemia Father   . Hypertension Father   . Prostate cancer Father 85   Social History:  reports that she has never smoked. She does not have any smokeless tobacco history on file. She reports that  drinks alcohol. She reports that she does not use illicit drugs.  Allergies: No Known Allergies  Current outpatient prescriptions:atenolol (TENORMIN) 100 MG tablet, Take 50 mg by mouth every morning. , Disp: , Rfl: ;  CALCIUM CITRATE PO, Take 1 tablet by mouth every morning., Disp: , Rfl: ;  cetirizine (ZYRTEC) 10 MG tablet, Take 10 mg by mouth every morning. , Disp: , Rfl: ;  ferrous sulfate 325 (65 FE) MG tablet, Take 1 tablet (325 mg total) by mouth 2 (two) times daily with a meal., Disp: , Rfl: 3 naproxen sodium (ALEVE) 220 MG tablet, Take 440 mg by mouth every 8 (eight) hours as needed (For headache.)., Disp: , Rfl: ;  omeprazole (PRILOSEC) 20 MG capsule, Take 20 mg by mouth every morning. , Disp: , Rfl: ;  albuterol (PROVENTIL HFA;VENTOLIN HFA) 108 (90 BASE) MCG/ACT inhaler, Inhale 2 puffs into the lungs every 4 (four) hours as needed for shortness of breath., Disp: , Rfl:  montelukast (SINGULAIR) 10 MG tablet, Take 10 mg by mouth every morning. , Disp: , Rfl:  Current facility-administered medications:0.9 %  sodium chloride infusion, ,  Intravenous, Continuous, D Jeananne Rama, PA-C, Last Rate: 75 mL/hr at 02/07/13 0802;  ceFAZolin (ANCEF) IVPB 2 g/50 mL premix, 2 g, Intravenous, On Call, D Kevin Allred, PA-C;  ketorolac (TORADOL) 30 MG/ML injection 30 mg, 30 mg, Intravenous, On Call, D Jeananne Rama, PA-C  Results for orders placed during the hospital encounter of 02/07/13  APTT      Result Value Range   aPTT 28  24 - 37 seconds  CBC      Result Value Range   WBC 7.1  4.0 - 10.5 K/uL   RBC 4.03  3.87 - 5.11 MIL/uL   Hemoglobin 12.8  12.0 - 15.0 g/dL   HCT 16.1  09.6 - 04.5 %   MCV 93.3  78.0 - 100.0 fL   MCH 31.8  26.0 - 34.0 pg   MCHC 34.0  30.0 - 36.0 g/dL   RDW 40.9  81.1 - 91.4 %   Platelets 342  150 - 400 K/uL  PROTIME-INR      Result Value Range   Prothrombin Time 12.8  11.6 - 15.2 seconds   INR 0.97  0.00 - 1.49   02/07/13 BMP pending Review of Systems  Constitutional: Negative for fever and chills.  Respiratory: Negative for shortness of breath.        Occ cough  Cardiovascular: Negative for chest pain.  Gastrointestinal: Negative for  nausea and vomiting.       No upper abd pain reported  Genitourinary: Negative for dysuria and hematuria.       Menorrhagia with occ pelvic pressure/pain  Musculoskeletal: Negative for back pain.  Neurological: Negative for headaches.    Blood pressure 121/84, pulse 67, temperature 97 F (36.1 C), temperature source Oral, resp. rate 16, height 5\' 2"  (1.575 m), weight 170 lb (77.111 kg), last menstrual period 01/21/2013, SpO2 97.00%. Physical Exam  Constitutional: She is oriented to person, place, and time. She appears well-developed and well-nourished.  Cardiovascular: Normal rate and regular rhythm.   Respiratory: Effort normal and breath sounds normal.  GI: Soft. Bowel sounds are normal.  Palpable fibroid uterus with mild tenderness to palpation  Musculoskeletal: Normal range of motion. She exhibits no edema.  Neurological: She is alert and oriented to person, place,  and time.     Assessment/Plan Pt with symptomatic uterine fibroids. Plan is for bilateral uterine artery embolization today followed by overnight observation for hemodynamic monitoring/pain control. Details/risks of procedure d/w pt with her understanding and consent.  ALLRED,D KEVIN 02/07/2013, 8:10 AM

## 2013-02-07 NOTE — ED Notes (Signed)
5Fr sheath removed from R Fem Artery by Dr. Lowella Dandy.  Hemostasis achieved using ExoSeal device.  R groin level 0, 3+RDP. Manual pressure held X 5 mins.

## 2013-02-07 NOTE — Procedures (Signed)
Successful bilateral uterine artery embolization.  No immediate complication. Will keep overnight for symptom management.

## 2013-02-08 ENCOUNTER — Other Ambulatory Visit: Payer: Self-pay | Admitting: Radiology

## 2013-02-08 DIAGNOSIS — D259 Leiomyoma of uterus, unspecified: Secondary | ICD-10-CM

## 2013-02-08 MED ORDER — HYDROCODONE-ACETAMINOPHEN 5-325 MG PO TABS: 1.0000 | ORAL_TABLET | ORAL | Status: DC | PRN

## 2013-02-08 NOTE — Progress Notes (Signed)
Assessment unchanged. Pt verbalized understanding of dc instructions through teach back. Already has My Chart account. Handwritten scripts x 4 meds given as provided by Jeananne Rama, PA. Pt discharged via wc to front entrance to meet awaiting vehicle to carry home. Accompanied by NT and friend.

## 2013-02-08 NOTE — Discharge Summary (Signed)
Physician Discharge Summary  Patient ID: Katherine Rosario MRN: [id number] DOB/AGE: 1971-06-20 42 y.o.  Admit date: 02/07/2013 Discharge date: 02/08/2013  Admission Diagnoses: Symptomatic uterine fibroids with menorrhagia and pelvic pain  Discharge Diagnoses: Symptomatic uterine fibroids, status post successful bilateral uterine artery embolization on 02/07/2013 Past Medical History  Diagnosis Date  . GERD (gastroesophageal reflux disease)   . Allergic rhinitis   . Hyperlipidemia   . Asthma   . Palpitations   . Heart palpitations   . Fibroids   . Anemia     Heavy menstrual flow/OTC FE BID:       Discharged Condition: good  Hospital Course: Katherine Rosario is a 42 year old white female, patient of Dr. Arelia Sneddon, who was referred to the interventional radiology service for further evaluation and treatment of symptomatic uterine fibroids. On 02/07/2013 the patient underwent successful bilateral uterine artery embolization by Dr. Richarda Overlie. The patient tolerated the procedure well and was admitted to the hospital for overnight observation and pain management. She was placed on a Dilaudid PCA pump. Overnight the patient did well with exception of some mild intermittent pelvic cramping and nausea. She was given Zofran for nausea which she tolerated well. She was able to void, ambulate and tolerate her diet without difficulty. On the morning of discharge the patient's dilaudid PCA pump was discontinued and she was transitioned to oral Vicodin .She continued to complain of intermittent mild pelvic cramping. She was seen and evaluated by Dr. Lowella Dandy and was deemed stable for discharge. She was given prescriptions for ibuprofen 600 mg( #20)-  1 tablet every 6 hours for the next 5 days. Colace 100 mg (#20)-one tablet twice daily  as needed for constipation. Vicodin 5/325( #30) 1-2 tablets every 4-6 hours as needed for pain. Phenergan 25 mg (#10)- 1 tablet every 6 hours as needed for nausea . The patient will be scheduled for  follow up visit in the interventional radiology clinic with Dr. Lowella Dandy in approximately 2 weeks. She was told to contact our service with any further questions or concerns. She will continue current gynecologic care with Dr. Arelia Sneddon.  Consults: none  Significant Diagnostic Studies:  Results for orders placed during the hospital encounter of 02/07/13  APTT      Result Value Range   aPTT 28  24 - 37 seconds  BASIC METABOLIC PANEL      Result Value Range   Sodium 136  135 - 145 mEq/L   Potassium 3.8  3.5 - 5.1 mEq/L   Chloride 104  96 - 112 mEq/L   CO2 23  19 - 32 mEq/L   Glucose, Bld 96  70 - 99 mg/dL   BUN 12  6 - 23 mg/dL   Creatinine, Ser 4.09  0.50 - 1.10 mg/dL   Calcium 8.8  8.4 - 81.1 mg/dL   GFR calc non Af Amer >90  >90 mL/min   GFR calc Af Amer >90  >90 mL/min  CBC      Result Value Range   WBC 7.1  4.0 - 10.5 K/uL   RBC 4.03  3.87 - 5.11 MIL/uL   Hemoglobin 12.8  12.0 - 15.0 g/dL   HCT 91.4  78.2 - 95.6 %   MCV 93.3  78.0 - 100.0 fL   MCH 31.8  26.0 - 34.0 pg   MCHC 34.0  30.0 - 36.0 g/dL   RDW 21.3  08.6 - 57.8 %   Platelets 342  150 - 400 K/uL  HCG, SERUM, QUALITATIVE  Result Value Range   Preg, Serum NEGATIVE  NEGATIVE  PROTIME-INR      Result Value Range   Prothrombin Time 12.8  11.6 - 15.2 seconds   INR 0.97  0.00 - 1.49     Treatments: Ir Angiogram Pelvis Selective Or Supraselective  02/07/2013   *RADIOLOGY REPORT*  Indication: 42 year old with uterine fibroids and menorrhagia.  PROCEDURE(S):  BILATERAL UTERINE ARTERY EMBOLIZATION; ULTRASOUND GUIDANCE FOR VASCULAR ACCESS; PELVIC ANGIOGRAPHY  Physician:  Rachelle Hora. Henn, MD  Medications: Versed 4 mg, Fentanyl 200 mcg.  Toradol 30 mg, Ancef 2 gm.A radiology nurse monitored the patient for moderate sedation.  Moderate sedation time: 105 minutes  Fluoroscopy time: 28 minutes and 24-seconds  Procedure:Informed consent was obtained for the uterine artery embolization procedure.  The patient was placed supine on the  interventional table.  The patient had palpable pedal pulses.  The right groin was prepped and draped in a sterile fashion.  Maximal barrier sterile technique was utilized including caps, mask, sterile gowns, sterile gloves, sterile drape, hand hygiene and skin antiseptic.  The skin was anesthetized 1% lidocaine.  Using ultrasound guidance, 21 gauge needle was directed in the right common femoral artery and a micropuncture dilator set was placed. The vascular access was upsized to a 5-French vascular sheath.  A Cobra catheter was used to cannulate the left common iliac artery and the left internal iliac artery.  A series of arteriograms were performed to identify the left uterine artery orfice.  A Progreat microcatheter was advanced into the left uterine artery.  Two vials of Embospheres (500 - 700 micron) were injected through the microcatheter with fluoroscopic guidance.  There was near complete stasis of the left uterine artery following administration of the particles.  A Waltman's loop was formed using the Cobra catheter and the right internal iliac artery was cannulated.  The right uterine artery was identified with contrast angiograms.  The microcatheter was advanced into the right uterine artery and a series of angiograms were performed. Two vials of Embospheres (500- 700 micron) were injected through the microcatheter with fluoroscopic guidance.  There was near complete stasis of the right uterine artery at the end of the procedure.  The microcatheter was removed.  The Cobra catheter was straightened out over the aortic bifurcation and removed over a wire.  Angiogram was performed through the groin sheath.  The vascular sheath was removed with an Exoseal closure device.  Findings:Large uterine arteries bilaterally.  Near stasis of the uterine arteries at the end of the procedure.  Fluoroscopic images were obtained for documentation.  Complicatons: None  Impression:Successful uterine artery embolization  procedure.   Original Report Authenticated By: Richarda Overlie, M.D.   Ir Angiogram Pelvis Selective Or Supraselective  02/07/2013   *RADIOLOGY REPORT*  Indication: 42 year old with uterine fibroids and menorrhagia.  PROCEDURE(S):  BILATERAL UTERINE ARTERY EMBOLIZATION; ULTRASOUND GUIDANCE FOR VASCULAR ACCESS; PELVIC ANGIOGRAPHY  Physician:  Rachelle Hora. Henn, MD  Medications: Versed 4 mg, Fentanyl 200 mcg.  Toradol 30 mg, Ancef 2 gm.A radiology nurse monitored the patient for moderate sedation.  Moderate sedation time: 105 minutes  Fluoroscopy time: 28 minutes and 24-seconds  Procedure:Informed consent was obtained for the uterine artery embolization procedure.  The patient was placed supine on the interventional table.  The patient had palpable pedal pulses.  The right groin was prepped and draped in a sterile fashion.  Maximal barrier sterile technique was utilized including caps, mask, sterile gowns, sterile gloves, sterile drape, hand hygiene and skin antiseptic.  The skin was anesthetized 1% lidocaine.  Using ultrasound guidance, 21 gauge needle was directed in the right common femoral artery and a micropuncture dilator set was placed. The vascular access was upsized to a 5-French vascular sheath.  A Cobra catheter was used to cannulate the left common iliac artery and the left internal iliac artery.  A series of arteriograms were performed to identify the left uterine artery orfice.  A Progreat microcatheter was advanced into the left uterine artery.  Two vials of Embospheres (500 - 700 micron) were injected through the microcatheter with fluoroscopic guidance.  There was near complete stasis of the left uterine artery following administration of the particles.  A Waltman's loop was formed using the Cobra catheter and the right internal iliac artery was cannulated.  The right uterine artery was identified with contrast angiograms.  The microcatheter was advanced into the right uterine artery and a series of angiograms  were performed. Two vials of Embospheres (500- 700 micron) were injected through the microcatheter with fluoroscopic guidance.  There was near complete stasis of the right uterine artery at the end of the procedure.  The microcatheter was removed.  The Cobra catheter was straightened out over the aortic bifurcation and removed over a wire.  Angiogram was performed through the groin sheath.  The vascular sheath was removed with an Exoseal closure device.  Findings:Large uterine arteries bilaterally.  Near stasis of the uterine arteries at the end of the procedure.  Fluoroscopic images were obtained for documentation.  Complicatons: None  Impression:Successful uterine artery embolization procedure.   Original Report Authenticated By: Richarda Overlie, M.D.   Ir US Guide Vasc Access Right  02/07/2013   *RADIOLOGY REPORT*  Indication: 42 year old with uterine fibroids and menorrhagia.  PROCEDURE(S):  BILATERAL UTERINE ARTERY EMBOLIZATION; ULTRASOUND GUIDANCE FOR VASCULAR ACCESS; PELVIC ANGIOGRAPHY  Physician:  Rachelle Hora. Henn, MD  Medications: Versed 4 mg, Fentanyl 200 mcg.  Toradol 30 mg, Ancef 2 gm.A radiology nurse monitored the patient for moderate sedation.  Moderate sedation time: 105 minutes  Fluoroscopy time: 28 minutes and 24-seconds  Procedure:Informed consent was obtained for the uterine artery embolization procedure.  The patient was placed supine on the interventional table.  The patient had palpable pedal pulses.  The right groin was prepped and draped in a sterile fashion.  Maximal barrier sterile technique was utilized including caps, mask, sterile gowns, sterile gloves, sterile drape, hand hygiene and skin antiseptic.  The skin was anesthetized 1% lidocaine.  Using ultrasound guidance, 21 gauge needle was directed in the right common femoral artery and a micropuncture dilator set was placed. The vascular access was upsized to a 5-French vascular sheath.  A Cobra catheter was used to cannulate the left common  iliac artery and the left internal iliac artery.  A series of arteriograms were performed to identify the left uterine artery orfice.  A Progreat microcatheter was advanced into the left uterine artery.  Two vials of Embospheres (500 - 700 micron) were injected through the microcatheter with fluoroscopic guidance.  There was near complete stasis of the left uterine artery following administration of the particles.  A Waltman's loop was formed using the Cobra catheter and the right internal iliac artery was cannulated.  The right uterine artery was identified with contrast angiograms.  The microcatheter was advanced into the right uterine artery and a series of angiograms were performed. Two vials of Embospheres (500- 700 micron) were injected through the microcatheter with fluoroscopic guidance.  There was near complete stasis of  the right uterine artery at the end of the procedure.  The microcatheter was removed.  The Cobra catheter was straightened out over the aortic bifurcation and removed over a wire.  Angiogram was performed through the groin sheath.  The vascular sheath was removed with an Exoseal closure device.  Findings:Large uterine arteries bilaterally.  Near stasis of the uterine arteries at the end of the procedure.  Fluoroscopic images were obtained for documentation.  Complicatons: None  Impression:Successful uterine artery embolization procedure.   Original Report Authenticated By: Richarda Overlie, M.D.   Ir Embo Tumor Organ Ischemia Infarct Inc Guide Roadmapping  02/07/2013   *RADIOLOGY REPORT*  Indication: 42 year old with uterine fibroids and menorrhagia.  PROCEDURE(S):  BILATERAL UTERINE ARTERY EMBOLIZATION; ULTRASOUND GUIDANCE FOR VASCULAR ACCESS; PELVIC ANGIOGRAPHY  Physician:  Rachelle Hora. Henn, MD  Medications: Versed 4 mg, Fentanyl 200 mcg.  Toradol 30 mg, Ancef 2 gm.A radiology nurse monitored the patient for moderate sedation.  Moderate sedation time: 105 minutes  Fluoroscopy time: 28 minutes and  24-seconds  Procedure:Informed consent was obtained for the uterine artery embolization procedure.  The patient was placed supine on the interventional table.  The patient had palpable pedal pulses.  The right groin was prepped and draped in a sterile fashion.  Maximal barrier sterile technique was utilized including caps, mask, sterile gowns, sterile gloves, sterile drape, hand hygiene and skin antiseptic.  The skin was anesthetized 1% lidocaine.  Using ultrasound guidance, 21 gauge needle was directed in the right common femoral artery and a micropuncture dilator set was placed. The vascular access was upsized to a 5-French vascular sheath.  A Cobra catheter was used to cannulate the left common iliac artery and the left internal iliac artery.  A series of arteriograms were performed to identify the left uterine artery orfice.  A Progreat microcatheter was advanced into the left uterine artery.  Two vials of Embospheres (500 - 700 micron) were injected through the microcatheter with fluoroscopic guidance.  There was near complete stasis of the left uterine artery following administration of the particles.  A Waltman's loop was formed using the Cobra catheter and the right internal iliac artery was cannulated.  The right uterine artery was identified with contrast angiograms.  The microcatheter was advanced into the right uterine artery and a series of angiograms were performed. Two vials of Embospheres (500- 700 micron) were injected through the microcatheter with fluoroscopic guidance.  There was near complete stasis of the right uterine artery at the end of the procedure.  The microcatheter was removed.  The Cobra catheter was straightened out over the aortic bifurcation and removed over a wire.  Angiogram was performed through the groin sheath.  The vascular sheath was removed with an Exoseal closure device.  Findings:Large uterine arteries bilaterally.  Near stasis of the uterine arteries at the end of the  procedure.  Fluoroscopic images were obtained for documentation.  Complicatons: None  Impression:Successful uterine artery embolization procedure.   Original Report Authenticated By: Richarda Overlie, M.D.    Discharge Exam: Blood pressure 114/60, pulse 69, temperature 98.9 F (37.2 C), temperature source Oral, resp. rate 13, height 5\' 2"  (1.575 m), weight 170 lb (77.111 kg), last menstrual period 01/21/2013, SpO2 97.00%. The patient is awake, alert and oriented x3. Chest is clear to auscultation bilaterally. Heart with regular rate and rhythm. Abdomen soft, positive bowel sounds, mild pelvic tenderness to palpation with fibroid uterus. Extremities with full range of motion and no significant edema. Puncture site right common femoral artery  clean, dry, soft, nontender, no hematoma. Intact distal pulses.  Disposition: Home  Discharge Orders   Future Appointments Provider Department Dept Phone   02/24/2013 3:45 PM Newt Lukes, MD [facility] [phone]   Future Orders Complete By Expires     Call MD for:  difficulty breathing, headache or visual disturbances  As directed     Call MD for:  persistant dizziness or light-headedness  As directed     Call MD for:  persistant nausea and vomiting  As directed     Call MD for:  redness, tenderness, or signs of infection (pain, swelling, redness, odor or green/yellow discharge around incision site)  As directed     Call MD for:  severe uncontrolled pain  As directed     Call MD for:  temperature >100.4  As directed     Change dressing (specify)  As directed     Comments:      May keep bandaid over right groin puncture site for next 2-3 days and change daily; may wash site with soap and water    Diet - low sodium heart healthy  As directed     Discharge instructions  As directed     Comments:      Radiology dept will call you with follow up appt with Dr. Lowella Dandy in 2 weeks; call [phone] or [phone] with any questions     Discharge patient  As directed     Driving Restrictions  As directed     Comments:      No driving for 24 hours    Increase activity slowly  As directed     Lifting restrictions  As directed     Comments:      No heavy lifting for next 3-4 days    May shower / Bathe  As directed     May walk up steps  As directed     Sexual Activity Restrictions  As directed     Comments:      No sexual intercourse for 1 week        Medication List    STOP taking these medications       ALEVE 220 MG tablet  Generic drug:  naproxen sodium      TAKE these medications       albuterol 108 (90 BASE) MCG/ACT inhaler  Commonly known as:  PROVENTIL HFA;VENTOLIN HFA  Inhale 2 puffs into the lungs every 4 (four) hours as needed for shortness of breath.     atenolol 100 MG tablet  Commonly known as:  TENORMIN  Take 50 mg by mouth every morning.     CALCIUM CITRATE PO  Take 1 tablet by mouth every morning.     cetirizine 10 MG tablet  Commonly known as:  ZYRTEC  Take 10 mg by mouth every morning.     ferrous sulfate 325 (65 FE) MG tablet  Take 1 tablet (325 mg total) by mouth 2 (two) times daily with a meal.     montelukast 10 MG tablet  Commonly known as:  SINGULAIR  Take 10 mg by mouth every morning.     omeprazole 20 MG capsule  Commonly known as:  PRILOSEC  Take 20 mg by mouth every morning.           Follow-up Information   Follow up with Abundio Miu, MD. (radiology dept will call you with follow up appt with Dr. Lowella Dandy in 2 weeks; call [phone]  or [phone] with any questions)    Contact information:   [facility] [address] [place] [address] [phone]       Signed: Chinita Pester 02/08/2013, 8:23 AM

## 2013-02-10 ENCOUNTER — Other Ambulatory Visit (HOSPITAL_COMMUNITY): Payer: Self-pay | Admitting: Diagnostic Radiology

## 2013-02-10 DIAGNOSIS — N920 Excessive and frequent menstruation with regular cycle: Secondary | ICD-10-CM

## 2013-02-10 DIAGNOSIS — D259 Leiomyoma of uterus, unspecified: Secondary | ICD-10-CM

## 2013-02-10 NOTE — Discharge Summary (Signed)
Agree 

## 2013-02-20 ENCOUNTER — Ambulatory Visit
Admission: RE | Admit: 2013-02-20 | Discharge: 2013-02-20 | Disposition: A | Discharge status: Home / Self Care | Payer: BC Managed Care – PPO | Source: Ambulatory Visit | Attending: Radiology | Admitting: Radiology

## 2013-02-20 DIAGNOSIS — D259 Leiomyoma of uterus, unspecified: Secondary | ICD-10-CM

## 2013-02-20 NOTE — Progress Notes (Signed)
2 wk follow up [place] for symptomatic fibroids:  Minimal-moderate cramping for first week post procedure.  Resolved now.  Continues to have minimal spotting.  No foul odor.  Has not had menstrual cycle post [place] (was due last week).  Afebrile.  To return to work on Monday, February 24, 2013.  Melisia Leming Carmell Austria, RN 02/20/2013 10:26 AM Amado Nash, RN

## 2013-02-24 ENCOUNTER — Encounter: Payer: Self-pay | Admitting: Internal Medicine

## 2013-02-24 ENCOUNTER — Ambulatory Visit (INDEPENDENT_AMBULATORY_CARE_PROVIDER_SITE_OTHER): Payer: BC Managed Care – PPO | Admitting: Internal Medicine

## 2013-02-24 VITALS — BP 110/70 | HR 64 | Temp 98.4°F | Wt 169.6 lb

## 2013-02-24 DIAGNOSIS — J45909 Unspecified asthma, uncomplicated: Secondary | ICD-10-CM

## 2013-02-24 DIAGNOSIS — J4530 Mild persistent asthma, uncomplicated: Secondary | ICD-10-CM

## 2013-02-24 DIAGNOSIS — D649 Anemia, unspecified: Secondary | ICD-10-CM

## 2013-02-24 DIAGNOSIS — R002 Palpitations: Secondary | ICD-10-CM

## 2013-02-24 DIAGNOSIS — E785 Hyperlipidemia, unspecified: Secondary | ICD-10-CM

## 2013-02-24 MED ORDER — ATENOLOL 50 MG PO TABS: 50.0000 mg | ORAL_TABLET | Freq: Every day | ORAL | Status: DC

## 2013-02-24 MED ORDER — MONTELUKAST SODIUM 10 MG PO TABS: 10.0000 mg | ORAL_TABLET | Freq: Every morning | ORAL | Status: DC

## 2013-02-24 MED ORDER — OMEPRAZOLE 20 MG PO CPDR: 20.0000 mg | DELAYED_RELEASE_CAPSULE | Freq: Every morning | ORAL | Status: DC

## 2013-02-24 NOTE — Assessment & Plan Note (Signed)
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

## 2013-02-24 NOTE — Progress Notes (Signed)
  Subjective:    Patient ID: Katherine Rosario, female    DOB: 1971/04/02, 42 y.o.   MRN: [id number]  HPI  Here for follow up - reviewed chronic medical issues and interval medical events  Past Medical History  Diagnosis Date  . GERD (gastroesophageal reflux disease)   . Allergic rhinitis   . Hyperlipidemia   . Asthma   . Palpitations   . Heart palpitations   . Fibroids   . Anemia     Heavy menstrual flow/OTC FE BID:      Review of Systems  Constitutional: Negative for fever or weight change.  Respiratory: Negative for cough and shortness of breath.   Cardiovascular: Negative for chest pain or current palpitations.      Objective:   Physical Exam  BP 110/70  Pulse 64  Temp(Src) 98.4 F (36.9 C) (Oral)  Wt 169 lb 9.6 oz (76.93 kg)  BMI 31.01 kg/m2  SpO2 98%  LMP 01/21/2013 Wt Readings from Last 3 Encounters:  02/24/13 169 lb 9.6 oz (76.93 kg)  02/07/13 170 lb (77.111 kg)  11/20/12 170 lb (77.111 kg)   Constitutional: She is overweight, but appears well-developed and well-nourished. No distress.  Neck: Normal range of motion. Neck supple. No JVD present. No thyromegaly present.  Cardiovascular: Normal rate, regular rhythm and normal heart sounds.  No murmur heard. No BLE edema. Pulmonary/Chest: Effort normal and breath sounds normal. No respiratory distress. She has no wheezes.  Psychiatric: She has a normal mood and affect. Her behavior is normal. Judgment and thought content normal.   Lab Results  Component Value Date   WBC 7.1 02/07/2013   HGB 12.8 02/07/2013   HCT 37.6 02/07/2013   PLT 342 02/07/2013   GLUCOSE 96 02/07/2013   CHOL 200 08/23/2012   TRIG 120.0 08/23/2012   HDL 38.90* 08/23/2012   LDLCALC 137* 08/23/2012   ALT 14 08/23/2012   AST 17 08/23/2012   NA 136 02/07/2013   K 3.8 02/07/2013   CL 104 02/07/2013   CREATININE 0.77 02/07/2013   BUN 12 02/07/2013   CO2 23 02/07/2013   TSH 2.53 08/23/2012   INR 0.97 02/07/2013       Assessment & Plan:   See problem  list. Medications and labs reviewed today.  Time spent with patient today 25 minutes, greater than 50% time spent counseling patient on recent fibroid ablation, weight loss efforts in setting of hyperlipidemia and medication review. Also review of interval medical records

## 2013-02-24 NOTE — Assessment & Plan Note (Signed)
Related to heavy menses in setting of uterine fibroid Interventional radiology ablation of fibroid June 2014 reviewed Patient feels improved during the interval, continue to work with gynecology Dr. Arelia Sneddon on same

## 2013-02-24 NOTE — Patient Instructions (Signed)
It was good to see you today. We have reviewed your prior records including labs and tests today Medications reviewed and updated, no changes recommended at this time. Refill on medication(s) as discussed today. Please schedule followup in 6 months for annual and labs, call sooner if problems.

## 2013-02-24 NOTE — Assessment & Plan Note (Signed)
Never on statin but strong FH - both parents on meds Would consider statin if needed - check lipids annually The patient is asked to make an attempt to improve diet and exercise patterns to aid in medical management of this problem.

## 2013-02-24 NOTE — Assessment & Plan Note (Signed)
Intermittent symptoms >10 years - Reports several prior Holter monitors and echo done for same in [place], none in past 3 years, "nothing ever found" On beta-blocker for same - few episodes at this time The current medical regimen is effective;  continue present plan and medications.

## 2013-02-25 ENCOUNTER — Other Ambulatory Visit: Payer: Self-pay | Admitting: *Deleted

## 2013-02-25 MED ORDER — OMEPRAZOLE 20 MG PO CPDR: 20.0000 mg | DELAYED_RELEASE_CAPSULE | Freq: Every morning | ORAL | Status: DC

## 2013-02-25 MED ORDER — ATENOLOL 50 MG PO TABS: 50.0000 mg | ORAL_TABLET | Freq: Every day | ORAL | Status: DC

## 2013-05-20 ENCOUNTER — Telehealth: Payer: Self-pay | Admitting: Radiology

## 2013-05-20 ENCOUNTER — Telehealth: Payer: Self-pay | Admitting: Emergency Medicine

## 2013-05-20 NOTE — Telephone Encounter (Signed)
Pt returned call for 104mo f/u [place] by Dr Lowella Dandy.  Flow is lighter, no other symptoms.  She has noticed more" dryness" which was not normal for her prior to the procedure.   We will call her for her 67mo f/u visit and MRI.

## 2013-05-20 NOTE — Telephone Encounter (Signed)
3 mo f/u call for status update post [place].  Left message requesting return call for patient's status update.  Rosalie Buenaventura Carmell [place], [place] 05/20/2013 10:25 AM

## 2013-07-10 ENCOUNTER — Other Ambulatory Visit: Payer: Self-pay | Admitting: Obstetrics and Gynecology

## 2013-07-10 ENCOUNTER — Other Ambulatory Visit (HOSPITAL_COMMUNITY): Payer: Self-pay | Admitting: Diagnostic Radiology

## 2013-07-10 ENCOUNTER — Other Ambulatory Visit: Payer: Self-pay

## 2013-07-10 DIAGNOSIS — D219 Benign neoplasm of connective and other soft tissue, unspecified: Secondary | ICD-10-CM

## 2013-07-10 LAB — LIPID PANEL
Cholesterol: 240 mg/dL — AB (ref 0–200)
LDL Cholesterol: 159 mg/dL
Triglycerides: 155 mg/dL (ref 40–160)

## 2013-08-13 ENCOUNTER — Ambulatory Visit
Admission: RE | Admit: 2013-08-13 | Discharge: 2013-08-13 | Disposition: A | Discharge status: Home / Self Care | Payer: BC Managed Care – PPO | Source: Ambulatory Visit | Attending: Obstetrics and Gynecology | Admitting: Obstetrics and Gynecology

## 2013-08-13 ENCOUNTER — Ambulatory Visit
Admission: RE | Admit: 2013-08-13 | Discharge: 2013-08-13 | Disposition: A | Discharge status: Home / Self Care | Payer: BC Managed Care – PPO | Source: Ambulatory Visit | Attending: Diagnostic Radiology | Admitting: Diagnostic Radiology

## 2013-08-13 DIAGNOSIS — D219 Benign neoplasm of connective and other soft tissue, unspecified: Secondary | ICD-10-CM

## 2013-08-13 MED ORDER — GADOBENATE DIMEGLUMINE 529 MG/ML IV SOLN
18.0000 mL | Freq: Once | INTRAVENOUS | Status: AC | PRN
  Administered 2013-08-13: 18 mL via INTRAVENOUS

## 2013-08-13 NOTE — Progress Notes (Signed)
LMP:  07/27/2013  Frequency:  Usually monthly.  However, did not have cycle August or October 2014.  Duration:  3-5 days.  Less menstrual flow, minimal clots.  Overall improvement with decreased menstrual cramping and decreased abdominal pressure.    Duanna Runk Carmell [place], RN 08/13/2013 11:55 AM

## 2013-08-25 ENCOUNTER — Ambulatory Visit (INDEPENDENT_AMBULATORY_CARE_PROVIDER_SITE_OTHER): Payer: BC Managed Care – PPO

## 2013-08-25 ENCOUNTER — Ambulatory Visit (INDEPENDENT_AMBULATORY_CARE_PROVIDER_SITE_OTHER): Payer: BC Managed Care – PPO | Admitting: Internal Medicine

## 2013-08-25 ENCOUNTER — Encounter: Payer: Self-pay | Admitting: Internal Medicine

## 2013-08-25 VITALS — BP 112/82 | HR 67 | Temp 99.4°F | Wt 189.8 lb

## 2013-08-25 DIAGNOSIS — Z0000 Encounter for general adult medical examination without abnormal findings: Secondary | ICD-10-CM

## 2013-08-25 DIAGNOSIS — E785 Hyperlipidemia, unspecified: Secondary | ICD-10-CM

## 2013-08-25 DIAGNOSIS — J309 Allergic rhinitis, unspecified: Secondary | ICD-10-CM

## 2013-08-25 DIAGNOSIS — E669 Obesity, unspecified: Secondary | ICD-10-CM

## 2013-08-25 LAB — CBC WITH DIFFERENTIAL/PLATELET
Eosinophils Relative: 2.7 % (ref 0.0–5.0)
Lymphocytes Relative: 27.7 % (ref 12.0–46.0)
MCV: 92.5 fl (ref 78.0–100.0)
Monocytes Absolute: 0.7 10*3/uL (ref 0.1–1.0)
Monocytes Relative: 6.9 % (ref 3.0–12.0)
Neutrophils Relative %: 61.9 % (ref 43.0–77.0)
Platelets: 314 10*3/uL (ref 150.0–400.0)
WBC: 10.7 10*3/uL — ABNORMAL HIGH (ref 4.5–10.5)

## 2013-08-25 LAB — HEPATIC FUNCTION PANEL
ALT: 33 U/L (ref 0–35)
AST: 23 U/L (ref 0–37)
Albumin: 4.2 g/dL (ref 3.5–5.2)
Alkaline Phosphatase: 55 U/L (ref 39–117)
Bilirubin, Direct: 0.1 mg/dL (ref 0.0–0.3)
Total Protein: 7.3 g/dL (ref 6.0–8.3)

## 2013-08-25 LAB — BASIC METABOLIC PANEL
BUN: 11 mg/dL (ref 6–23)
Calcium: 9.1 mg/dL (ref 8.4–10.5)
Creatinine, Ser: 0.8 mg/dL (ref 0.4–1.2)
GFR: 82.47 mL/min (ref >60.00)

## 2013-08-25 LAB — LIPID PANEL: Cholesterol: 211 mg/dL — ABNORMAL HIGH (ref 0–200)

## 2013-08-25 LAB — TSH: TSH: 3.24 u[IU]/mL (ref 0.35–5.50)

## 2013-08-25 MED ORDER — FLUTICASONE PROPIONATE 50 MCG/ACT NA SUSP: 1.0000 | Freq: Every day | NASAL | Status: DC

## 2013-08-25 NOTE — Assessment & Plan Note (Signed)
Never on statin but strong FH - both parents on meds Would consider statin if needed, reviewed LDL 150 at recent employer wellness screen check lipids annually The patient is asked to make an attempt to improve diet and exercise patterns to aid in medical management of this problem.

## 2013-08-25 NOTE — Patient Instructions (Addendum)
It was good to see you today.  We have reviewed your prior records including labs and tests today  Health Maintenance reviewed - all recommended immunizations and age-appropriate screenings are up-to-date.  Test(s) ordered today. Your results will be released to MyChart (or called to you) after review, usually within 72hours after test completion. If any changes need to be made, you will be notified at that same time.  Medications reviewed and updated,  For your allergies, nose and ears use Flonase spray once daily during winter season, then as needed No other changes recommended at this time.  Your prescription(s) have been submitted to your pharmacy. Please take as directed and contact our office if you believe you are having problem(s) with the medication(s).  Work on lifestyle changes as discussed (low fat, low carb, increased protein diet; improved exercise efforts; weight loss) to control sugar, blood pressure and cholesterol levels and/or reduce risk of developing other medical problems. Look into [email or web address] or other type of food journal to assist you in this process.  Please schedule followup in 6 months to monitor cholesterol, call sooner if problems.  Health Maintenance, Female A healthy lifestyle and preventative care can promote health and wellness.  Maintain regular health, dental, and eye exams.  Eat a healthy diet. Foods like vegetables, fruits, whole grains, low-fat dairy products, and lean protein foods contain the nutrients you need without too many calories. Decrease your intake of foods high in solid fats, added sugars, and salt. Get information about a proper diet from your caregiver, if necessary.  Regular physical exercise is one of the most important things you can do for your health. Most adults should get at least 150 minutes of moderate-intensity exercise (any activity that increases your heart rate and causes you to sweat) each week. In addition, most adults  need muscle-strengthening exercises on 2 or more days a week.   Maintain a healthy weight. The body mass index (BMI) is a screening tool to identify possible weight problems. It provides an estimate of body fat based on height and weight. Your caregiver can help determine your BMI, and can help you achieve or maintain a healthy weight. For adults 20 years and older:  A BMI below 18.5 is considered underweight.  A BMI of 18.5 to 24.9 is normal.  A BMI of 25 to 29.9 is considered overweight.  A BMI of 30 and above is considered obese.  Maintain normal blood lipids and cholesterol by exercising and minimizing your intake of saturated fat. Eat a balanced diet with plenty of fruits and vegetables. Blood tests for lipids and cholesterol should begin at age 65 and be repeated every 5 years. If your lipid or cholesterol levels are high, you are over 50, or you are a high risk for heart disease, you may need your cholesterol levels checked more frequently.Ongoing high lipid and cholesterol levels should be treated with medicines if diet and exercise are not effective.  If you smoke, find out from your caregiver how to quit. If you do not use tobacco, do not start.  Lung cancer screening is recommended for adults aged 14 80 years who are at high risk for developing lung cancer because of a history of smoking. Yearly low-dose computed tomography (CT) is recommended for people who have at least a 30-pack-year history of smoking and are a current smoker or have quit within the past 15 years. A pack year of smoking is smoking an average of 1 pack of cigarettes a day for  1 year (for example: 1 pack a day for 30 years or 2 packs a day for 15 years). Yearly screening should continue until the smoker has stopped smoking for at least 15 years. Yearly screening should also be stopped for people who develop a health problem that would prevent them from having lung cancer treatment.  If you are pregnant, do not drink  alcohol. If you are breastfeeding, be very cautious about drinking alcohol. If you are not pregnant and choose to drink alcohol, do not exceed 1 drink per day. One drink is considered to be 12 ounces (355 mL) of beer, 5 ounces (148 mL) of wine, or 1.5 ounces (44 mL) of liquor.  Avoid use of street drugs. Do not share needles with anyone. Ask for help if you need support or instructions about stopping the use of drugs.  High blood pressure causes heart disease and increases the risk of stroke. Blood pressure should be checked at least every 1 to 2 years. Ongoing high blood pressure should be treated with medicines, if weight loss and exercise are not effective.  If you are 53 to 44 years old, ask your caregiver if you should take aspirin to prevent strokes.  Diabetes screening involves taking a blood sample to check your fasting blood sugar level. This should be done once every 3 years, after age 61, if you are within normal weight and without risk factors for diabetes. Testing should be considered at a younger age or be carried out more frequently if you are overweight and have at least 1 risk factor for diabetes.  Breast cancer screening is essential preventative care for women. You should practice "breast self-awareness." This means understanding the normal appearance and feel of your breasts and may include breast self-examination. Any changes detected, no matter how small, should be reported to a caregiver. Women in their 89s and 30s should have a clinical breast exam (CBE) by a caregiver as part of a regular health exam every 1 to 3 years. After age 73, women should have a CBE every year. Starting at age 30, women should consider having a mammogram (breast X-ray) every year. Women who have a family history of breast cancer should talk to their caregiver about genetic screening. Women at a high risk of breast cancer should talk to their caregiver about having an MRI and a mammogram every  year.  Breast cancer gene (BRCA)-related cancer risk assessment is recommended for women who have family members with BRCA-related cancers. BRCA-related cancers include breast, ovarian, tubal, and peritoneal cancers. Having family members with these cancers may be associated with an increased risk for harmful changes (mutations) in the breast cancer genes BRCA1 and BRCA2. Results of the assessment will determine the need for genetic counseling and BRCA1 and BRCA2 testing.  The Pap test is a screening test for cervical cancer. Women should have a Pap test starting at age 33. Between ages 92 and 70, Pap tests should be repeated every 2 years. Beginning at age 34, you should have a Pap test every 3 years as long as the past 3 Pap tests have been normal. If you had a hysterectomy for a problem that was not cancer or a condition that could lead to cancer, then you no longer need Pap tests. If you are between ages 33 and 22, and you have had normal Pap tests going back 10 years, you no longer need Pap tests. If you have had past treatment for cervical cancer or a condition that  could lead to cancer, you need Pap tests and screening for cancer for at least 20 years after your treatment. If Pap tests have been discontinued, risk factors (such as a new sexual partner) need to be reassessed to determine if screening should be resumed. Some women have medical problems that increase the chance of getting cervical cancer. In these cases, your caregiver may recommend more frequent screening and Pap tests.  The human papillomavirus (HPV) test is an additional test that may be used for cervical cancer screening. The HPV test looks for the virus that can cause the cell changes on the cervix. The cells collected during the Pap test can be tested for HPV. The HPV test could be used to screen women aged 39 years and older, and should be used in women of any age who have unclear Pap test results. After the age of 63, women should  have HPV testing at the same frequency as a Pap test.  Colorectal cancer can be detected and often prevented. Most routine colorectal cancer screening begins at the age of 5 and continues through age 71. However, your caregiver may recommend screening at an earlier age if you have risk factors for colon cancer. On a yearly basis, your caregiver may provide home test kits to check for hidden blood in the stool. Use of a small camera at the end of a tube, to directly examine the colon (sigmoidoscopy or colonoscopy), can detect the earliest forms of colorectal cancer. Talk to your caregiver about this at age 22, when routine screening begins. Direct examination of the colon should be repeated every 5 to 10 years through age 47, unless early forms of pre-cancerous polyps or small growths are found.  Hepatitis C blood testing is recommended for all people born from [birth year] through 1965 and any individual with known risks for hepatitis C.  Practice safe sex. Use condoms and avoid high-risk sexual practices to reduce the spread of sexually transmitted infections (STIs). Sexually active women aged 58 and younger should be checked for Chlamydia, which is a common sexually transmitted infection. Older women with new or multiple partners should also be tested for Chlamydia. Testing for other STIs is recommended if you are sexually active and at increased risk.  Osteoporosis is a disease in which the bones lose minerals and strength with aging. This can result in serious bone fractures. The risk of osteoporosis can be identified using a bone density scan. Women ages 67 and over and women at risk for fractures or osteoporosis should discuss screening with their caregivers. Ask your caregiver whether you should be taking a calcium supplement or vitamin D to reduce the rate of osteoporosis.  Menopause can be associated with physical symptoms and risks. Hormone replacement therapy is available to decrease symptoms and  risks. You should talk to your caregiver about whether hormone replacement therapy is right for you.  Use sunscreen. Apply sunscreen liberally and repeatedly throughout the day. You should seek shade when your shadow is shorter than you. Protect yourself by wearing long sleeves, pants, a wide-brimmed hat, and sunglasses year round, whenever you are outdoors.  Notify your caregiver of new moles or changes in moles, especially if there is a change in shape or color. Also notify your caregiver if a mole is larger than the size of a pencil eraser.  Stay current with your immunizations. Document Released: 03/06/2011 Document Revised: 12/16/2012 Document Reviewed: 03/06/2011 [facility] Patient Information 2014 [place], [place]. Exercise to Lose Weight Exercise and a healthy  diet may help you lose weight. Your doctor may suggest specific exercises. EXERCISE IDEAS AND TIPS  Choose low-cost things you enjoy doing, such as walking, bicycling, or exercising to workout videos.  Take stairs instead of the elevator.  Walk during your lunch break.  Park your car further away from work or school.  Go to a gym or an exercise class.  Start with 5 to 10 minutes of exercise each day. Build up to 30 minutes of exercise 4 to 6 days a week.  Wear shoes with good support and comfortable clothes.  Stretch before and after working out.  Work out until you breathe harder and your heart beats faster.  Drink extra water when you exercise.  Do not do so much that you hurt yourself, feel dizzy, or get very short of breath. Exercises that burn about 150 calories:  Running 1  miles in 15 minutes.  Playing volleyball for 45 to 60 minutes.  Washing and waxing a car for 45 to 60 minutes.  Playing touch football for 45 minutes.  Walking 1  miles in 35 minutes.  Pushing a stroller 1  miles in 30 minutes.  Playing basketball for 30 minutes.  Raking leaves for 30 minutes.  Bicycling 5 miles in 30  minutes.  Walking 2 miles in 30 minutes.  Dancing for 30 minutes.  Shoveling snow for 15 minutes.  Swimming laps for 20 minutes.  Walking up stairs for 15 minutes.  Bicycling 4 miles in 15 minutes.  Gardening for 30 to 45 minutes.  Jumping rope for 15 minutes.  Washing windows or floors for 45 to 60 minutes. Document Released: 09/23/2010 Document Revised: 11/13/2011 Document Reviewed: 09/23/2010 [facility] Patient Information 2014 [place], [place].

## 2013-08-25 NOTE — Progress Notes (Signed)
Pre-visit discussion using our clinic review tool. No additional management support is needed unless otherwise documented below in the visit note.  

## 2013-08-25 NOTE — Assessment & Plan Note (Signed)
Wt Readings from Last 3 Encounters:  08/25/13 189 lb 12.8 oz (86.093 kg)  02/24/13 169 lb 9.6 oz (76.93 kg)  02/07/13 170 lb (77.111 kg)   Recent trends reviewed The patient is asked to make an attempt to improve diet and exercise patterns to aid in medical management of this problem.

## 2013-08-25 NOTE — Assessment & Plan Note (Signed)
Add nasal steroid in addition to ongoing over-the-counter oral antihistamine as needed

## 2013-08-25 NOTE — Progress Notes (Signed)
Subjective:    Patient ID: Katherine Rosario, female    DOB: 10-18-1970, 42 y.o.   MRN: [id number]  HPI  patient is here today for annual physical. Patient feels well overall  Reviewed chronic medical issues and interval medical events  Past Medical History  Diagnosis Date  . GERD (gastroesophageal reflux disease)   . Allergic rhinitis   . Hyperlipidemia   . Asthma   . Palpitations   . Heart palpitations   . Fibroids   . Anemia     Heavy menstrual flow/OTC FE BID:     Family History  Problem Relation Age of Onset  . Hyperlipidemia Mother   . Hypertension Mother   . Colon cancer Father 70  . Hyperlipidemia Father   . Hypertension Father   . Prostate cancer Father 26   History  Substance Use Topics  . Smoking status: Never Smoker   . Smokeless tobacco: Current User    Types: Snuff     Comment: married to female partner Fleet Contras 12/2012, employed for [employer]  . Alcohol Use: Yes    Review of Systems  Constitutional: Negative for fatigue and unexpected weight change.  Respiratory: Negative for cough, shortness of breath and wheezing.   Cardiovascular: Negative for chest pain, palpitations and leg swelling.  Gastrointestinal: Negative for nausea, abdominal pain and diarrhea.  Neurological: Negative for dizziness, weakness, light-headedness and headaches.  Psychiatric/Behavioral: Negative for dysphoric mood. The patient is not nervous/anxious.   All other systems reviewed and are negative.       Objective:   Physical Exam BP 112/82  Pulse 67  Temp(Src) 99.4 F (37.4 C) (Oral)  Wt 189 lb 12.8 oz (86.093 kg)  SpO2 97%  LMP 07/27/2013 Wt Readings from Last 3 Encounters:  08/25/13 189 lb 12.8 oz (86.093 kg)  02/24/13 169 lb 9.6 oz (76.93 kg)  02/07/13 170 lb (77.111 kg)   Constitutional: She is overweight, but appears well-developed and well-nourished. No distress.  HENT: Head: Normocephalic and atraumatic. Ears: B TMs ok, no erythema or effusion; Nose:  Nose normal. Mouth/Throat: Oropharynx is clear and moist. No oropharyngeal exudate.  Eyes: Conjunctivae and EOM are normal. Pupils are equal, round, and reactive to light. No scleral icterus.  Neck: Normal range of motion. Neck supple. No JVD present. No thyromegaly present.  Cardiovascular: Normal rate, regular rhythm and normal heart sounds.  No murmur heard. No BLE edema. Pulmonary/Chest: Effort normal and breath sounds normal. No respiratory distress. She has no wheezes.  Abdominal: Soft. Bowel sounds are normal. She exhibits no distension. There is no tenderness. no masses Musculoskeletal: Normal range of motion, no joint effusions. No gross deformities Neurological: She is alert and oriented to person, place, and time. No cranial nerve deficit. Coordination, balance, strength, speech and gait are normal.  Skin: Skin is warm and dry. No rash noted. No erythema.  Psychiatric: She has a normal mood and affect. Her behavior is normal. Judgment and thought content normal.   Lab Results  Component Value Date   WBC 7.1 02/07/2013   HGB 12.8 02/07/2013   HCT 37.6 02/07/2013   PLT 342 02/07/2013   GLUCOSE 96 02/07/2013   CHOL 240* 07/10/2013   TRIG 155 07/10/2013   HDL 50 07/10/2013   LDLCALC 159 07/10/2013   ALT 14 08/23/2012   AST 17 08/23/2012   NA 136 02/07/2013   K 3.8 02/07/2013   CL 104 02/07/2013   CREATININE 0.77 02/07/2013   BUN 12 02/07/2013   CO2 23  02/07/2013   TSH 2.53 08/23/2012   INR 0.97 02/07/2013        Assessment & Plan:   CPX/v70.0 - Patient has been counseled on age-appropriate routine health concerns for screening and prevention. These are reviewed and up-to-date. Immunizations are up-to-date or declined. Labs ordered and reviewed.  Also See problem list. Medications and labs reviewed today.

## 2013-08-26 ENCOUNTER — Encounter: Payer: Self-pay | Admitting: Internal Medicine

## 2013-08-26 LAB — LDL CHOLESTEROL, DIRECT: Direct LDL: 150.8 mg/dL

## 2013-10-24 ENCOUNTER — Encounter: Payer: Self-pay | Admitting: Internal Medicine

## 2013-10-24 ENCOUNTER — Ambulatory Visit (INDEPENDENT_AMBULATORY_CARE_PROVIDER_SITE_OTHER): Payer: BC Managed Care – PPO | Admitting: Internal Medicine

## 2013-10-24 VITALS — BP 118/74 | HR 64 | Temp 99.2°F

## 2013-10-24 DIAGNOSIS — J309 Allergic rhinitis, unspecified: Secondary | ICD-10-CM

## 2013-10-24 DIAGNOSIS — J0110 Acute frontal sinusitis, unspecified: Secondary | ICD-10-CM

## 2013-10-24 MED ORDER — PROMETHAZINE-CODEINE 6.25-10 MG/5ML PO SYRP: 5.0000 mL | ORAL_SOLUTION | ORAL | Status: DC | PRN

## 2013-10-24 MED ORDER — LEVOFLOXACIN 500 MG PO TABS: 500.0000 mg | ORAL_TABLET | Freq: Every day | ORAL | Status: DC

## 2013-10-24 NOTE — Progress Notes (Signed)
Pre-visit discussion using our clinic review tool. No additional management support is needed unless otherwise documented below in the visit note.  

## 2013-10-24 NOTE — Patient Instructions (Addendum)
It was good to see you today.  Levaquin antibiotics and prescription headache and nausea syrup - Your prescription(s) have been submitted to your pharmacy. (syrup given to you to take your pharmacy) Please take as directed and contact our office if you believe you are having problem(s) with the medication(s).  Alternate between ibuprofen and tylenol for aches, pain and fever symptoms as discussed -or continue Advil cold and sinus as per box instructions  Hydrate, rest and call if worse or unimproved  Work excuse note for today and tomorrow as discussed  Sinus Headache A sinus headache happens when your sinuses become clogged or puffy (swollen). Sinus headaches can be mild or severe. HOME CARE  Take your medicines (antibiotics) as told. Finish them even if you start to feel better.  Only take medicine as told by your doctor.  Use a nose spray if you feel stuffed up (congested). GET HELP RIGHT AWAY IF:  You have a fever.  You have trouble seeing.  You suddenly have pain in your face or head.  You start to twitch or shake (seizure).  You are confused.  You get headaches more than once a week.  Light or sound bothers you.  You feel sick to your stomach (nauseous) or throw up (vomit).  Your headaches do not get better with treatment. MAKE SURE YOU:  Understand these instructions.  Will watch your condition.  Will get help right away if you are not doing well or get worse. Document Released: 12/21/2010 Document Revised: 11/13/2011 Document Reviewed: 12/21/2010 [facility] Patient Information 2014 [place], [place].

## 2013-10-24 NOTE — Addendum Note (Signed)
Addended by: Earnstine Regal on: 10/24/2013 04:55 PM   Modules accepted: Orders

## 2013-10-24 NOTE — Assessment & Plan Note (Signed)
Continue nasal steroid in addition to ongoing over-the-counter oral antihistamine as needed

## 2013-10-24 NOTE — Progress Notes (Signed)
Subjective:    Patient ID: Katherine Rosario, female    DOB: 04-05-1971, 43 y.o.   MRN: [id number]  Headache  This is a new problem. The current episode started in the past 7 days. The problem occurs constantly. The problem has been waxing and waning. The pain is located in the right unilateral and frontal region. The pain does not radiate. The pain quality is similar to prior headaches. The quality of the pain is described as aching and stabbing. The pain is moderate. Associated symptoms include eye watering (right), nausea, sinus pressure, a sore throat and vomiting. Pertinent negatives include no abdominal pain, abnormal behavior, back pain, ear pain, eye pain, eye redness, facial sweating, hearing loss, insomnia, muscle aches, numbness, phonophobia, photophobia, rhinorrhea, scalp tenderness, seizures, tinnitus or weakness. The symptoms are aggravated by unknown. Treatments tried: advil cold and sinus. The treatment provided mild relief. Her past medical history is significant for obesity. There is no history of hypertension, migraine headaches or recent head traumas.    Also reviewed chronic medical issues and interval medical events  Past Medical History  Diagnosis Date  . GERD (gastroesophageal reflux disease)   . Allergic rhinitis   . Hyperlipidemia   . Asthma   . Palpitations   . Heart palpitations   . Fibroids   . Anemia     Heavy menstrual flow/OTC FE BID:      Review of Systems  HENT: Positive for sinus pressure and sore throat. Negative for ear pain, hearing loss, rhinorrhea and tinnitus.   Eyes: Negative for photophobia, pain and redness.  Gastrointestinal: Positive for nausea and vomiting. Negative for abdominal pain.  Musculoskeletal: Negative for back pain.  Neurological: Positive for headaches. Negative for seizures, weakness and numbness.  Psychiatric/Behavioral: The patient does not have insomnia.        Objective:   Physical Exam  BP 118/74  Pulse 64  Temp(Src)  99.2 F (37.3 C) (Oral)  SpO2 96% Wt Readings from Last 3 Encounters:  08/25/13 189 lb 12.8 oz (86.093 kg)  02/24/13 169 lb 9.6 oz (76.93 kg)  02/07/13 170 lb (77.111 kg)    Constitutional: She is obese, but appears well-developed and well-nourished. No distress.  HENT: NCAT, mild sinus pressure to palp over frontal sinus R>L, Nares clear without swelling, OP clear Neck: Normal range of motion. Neck supple. No LAD or JVD present. No thyromegaly present.  Cardiovascular: Normal rate, regular rhythm and normal heart sounds.  No murmur heard. No BLE edema. Pulmonary/Chest: Effort normal and breath sounds normal. No respiratory distress. She has no wheezes.  Neurologic: CN2-12 symmetrically intact - speech/recall fluent, balance/gait normal Psychiatric: She has a normal mood and affect. Her behavior is normal. Judgment and thought content normal.   Lab Results  Component Value Date   WBC 10.7* 08/25/2013   HGB 13.6 08/25/2013   HCT 40.8 08/25/2013   PLT 314.0 08/25/2013   GLUCOSE 81 08/25/2013   CHOL 211* 08/25/2013   TRIG 219.0* 08/25/2013   HDL 44.70 08/25/2013   LDLDIRECT 150.8 08/25/2013   LDLCALC 159 07/10/2013   ALT 33 08/25/2013   AST 23 08/25/2013   NA 137 08/25/2013   K 4.3 08/25/2013   CL 101 08/25/2013   CREATININE 0.8 08/25/2013   BUN 11 08/25/2013   CO2 28 08/25/2013   TSH 3.24 08/25/2013   INR 0.97 02/07/2013    Mr Pelvis W Wo Contrast  08/13/2013   CLINICAL DATA:  Status post uterine artery embolization.  EXAM: MRI  PELVIS WITHOUT AND WITH CONTRAST  TECHNIQUE: Multiplanar multisequence MR imaging of the pelvis was performed both before and after administration of intravenous contrast.  CONTRAST:  32mL MULTIHANCE GADOBENATE DIMEGLUMINE 529 MG/ML IV SOLN  COMPARISON:  Pelvic MRI 11/29/2012.  FINDINGS: Interval decrease in size of fibroid uterus measuring 12.8 x 8.8 x 8.2 cm, previously 15.0 x 9.3 x 10.0 cm. The majority of the fibroids have decreased in size when  compared to prior examination. A representative intramural fibroid along the posterior right aspect of the midbody of the uterus measures 5.2 x 4.4 cm, previously 6.1 x 5.5 cm at the same location. Previously described submucosal fibroid has decreased in size measuring 2.3 cm, previously 2.9 cm. There is still residual distortion of the endometrium near this fibroid and a small intracavitary component cannot be entirely excluded (image 14; series 4).  Post contrast images demonstrate no enhancement of the fibroids. The remainder the uterus enhances homogeneously. There is no thickening of the junctional zone on the sagittal T2 image. Cervix is grossly unremarkable. Interval decrease in size of previously described cyst within the right ovary with the largest now measuring 2.2 cm and the smaller cyst measuring 1.4 cm. The left ovary is grossly unremarkable.  The visualized aspect of the kidneys and bladder are grossly unremarkable. No evidence for bowel obstruction. Normal osseous marrow signal.  IMPRESSION: Interval decrease in size of the uterus with smaller uterine fibroids which no longer demonstrate contrast enhancement status post uterine artery embolization.  There is still residual endometrial distortion adjacent to a smaller submucosal fibroid. A small intracavitary component of this fibroid cannot be entirely excluded.   Electronically Signed   By: Lovey Newcomer M.D.   On: 08/13/2013 11:46   [facility] Radiologist Eval & Mgmt  08/13/2013   EXAM: ESTABLISHED PATIENT OFFICE VISIT -LEVEL II [id number])  HISTORY OF PRESENT ILLNESS: 43 year old female with history of uterine fibroids and menorrhagia. She underwent uterine artery embolization on 02/07/2013. Overall, she has been very happy with the results of the procedure. Her menstrual periods now last 3-5 days and the flow is markedly less than it used to be. She also has decreased cramping and less pressure during her menstrual periods. She has no complaints and no  other health concerns at this time.  CHIEF COMPLAINT: 6 month follow-up after uterine artery embolization.  PHYSICAL EXAMINATION: Vitals: Temperature 98.2 degrees F, blood pressure 102/59 mmHg, pulse 67, O2 saturations on room air = 97%  Imaging: MRI from 08/13/2013 was reviewed: Impression: Interval decrease in the size of the uterus with smaller uterine fibroids which no longer demonstrate contrast enhancement after uterine artery embolization.  There is still residual endometrial distortion adjacent to a smaller submucosal fibroid. A small intracavitary component of this fibroid cannot be entirely excluded.  ASSESSMENT AND PLAN: Ms. Zemp has done very well following uterine artery embolization. Her menstrual bleeding has markedly decreased. She is very happy with the procedure results. MRI confirms that all of the uterine fibroids have been treated and there is no longer vascular flow within these fibroids. She does have pedunculated submucosal fibroids that extend into the endometrial cavity. Fortunately, she has not had any symptoms from these submucosal fibroids. We discussed possible symptoms of the submucosal fibroids. Patient will continue to follow-up with her gynecologist on a regular basis. She will contact us in the future if needed.   Electronically Signed   By: Markus Daft M.D.   On: 08/13/2013 13:06       Assessment &  Plan:   Acute frontal sinusitis -associated with headache, low-grade fever, sinus pressure and nausea  Neuro exam today benign -no evidence for meningismus  Treat with Levaquin daily for 7 days, promethazine with codeine as needed and continue over-the-counter Advil cold and sinus  Excuse note for yesterday and today provided  She'll call if symptoms unimproved in next 5-7 days, ER evaluation if worse over weekend

## 2013-11-10 ENCOUNTER — Other Ambulatory Visit: Payer: Self-pay

## 2013-11-10 DIAGNOSIS — Z1231 Encounter for screening mammogram for malignant neoplasm of breast: Secondary | ICD-10-CM

## 2013-11-11 ENCOUNTER — Other Ambulatory Visit: Payer: Self-pay | Admitting: Internal Medicine

## 2013-11-19 ENCOUNTER — Ambulatory Visit (INDEPENDENT_AMBULATORY_CARE_PROVIDER_SITE_OTHER): Payer: BC Managed Care – PPO | Admitting: Internal Medicine

## 2013-11-19 ENCOUNTER — Encounter: Payer: Self-pay | Admitting: Internal Medicine

## 2013-11-19 VITALS — BP 106/84 | HR 87 | Temp 101.2°F | Wt 192.2 lb

## 2013-11-19 DIAGNOSIS — J45909 Unspecified asthma, uncomplicated: Secondary | ICD-10-CM

## 2013-11-19 DIAGNOSIS — K219 Gastro-esophageal reflux disease without esophagitis: Secondary | ICD-10-CM

## 2013-11-19 DIAGNOSIS — J069 Acute upper respiratory infection, unspecified: Secondary | ICD-10-CM | POA: Insufficient documentation

## 2013-11-19 MED ORDER — OSELTAMIVIR PHOSPHATE 75 MG PO CAPS: 75.0000 mg | ORAL_CAPSULE | Freq: Two times a day (BID) | ORAL | Status: DC

## 2013-11-19 MED ORDER — AZITHROMYCIN 250 MG PO TABS
ORAL_TABLET | ORAL | Status: DC
Start: 2013-11-19 — End: 2014-02-09

## 2013-11-19 NOTE — Assessment & Plan Note (Signed)
prob influenza, cant r/o other bact, Mild to mod, for antibx course including tamiflu/zpack,  to f/u any worsening symptoms or concerns

## 2013-11-19 NOTE — Assessment & Plan Note (Signed)
stable overall by history and exam, recent data reviewed with pt, and pt to continue medical treatment as before,  to f/u any worsening symptoms or concerns SpO2 Readings from Last 3 Encounters:  11/19/13 97%  10/24/13 96%  08/25/13 97%

## 2013-11-19 NOTE — Assessment & Plan Note (Signed)
stable overall by history and exam, , and pt to continue medical treatment as before,  to f/u any worsening symptoms or concerns  

## 2013-11-19 NOTE — Progress Notes (Signed)
Pre visit review using our clinic review tool, if applicable. No additional management support is needed unless otherwise documented below in the visit note. 

## 2013-11-19 NOTE — Patient Instructions (Signed)
Please take all new medication as prescribed Please continue all other medications as before, and refills have been done if requested. Please have the pharmacy call with any other refills you may need. You can also take Delsym OTC for cough, and/or Mucinex (or it's generic off brand) for congestion, and tylenol as needed for pain.  Please keep your appointments with your specialists as you have planned  You are given the work note

## 2013-11-19 NOTE — Progress Notes (Signed)
Subjective:    Patient ID: Katherine Rosario, female    DOB: Aug 27, 1971, 43 y.o.   MRN: [id number]  HPI Here with 2-3 days acute onset fever, general weakness and malaise, diffuse myalgias, head congestion, mild prod cough yellowish green, n/v, crampy abd pains and several episodes watery stools at the beginning now improved. Pt denies chest pain, increased sob or doe, wheezing, orthopnea, PND, increased LE swelling, palpitations, dizziness or syncope. Pt denies new neurological symptoms such as new headache, or facial or extremity weakness or numbness   Pt denies polydipsia, polyuria, Denies worsening reflux, abd pain, dysphagia, other bowel change or blood. Past Medical History  Diagnosis Date  . GERD (gastroesophageal reflux disease)   . Allergic rhinitis   . Hyperlipidemia   . Asthma   . Palpitations   . Heart palpitations   . Fibroids   . Anemia     Heavy menstrual flow/OTC FE BID:     Past Surgical History  Procedure Laterality Date  . No past surgeries      reports that she has never smoked. Her smokeless tobacco use includes Snuff. She reports that she drinks alcohol. She reports that she does not use illicit drugs. family history includes Colon cancer (age of onset: 62) in her father; Hyperlipidemia in her father and mother; Hypertension in her father and mother; Prostate cancer (age of onset: 19) in her father. No Known Allergies Current Outpatient Prescriptions on File Prior to Visit  Medication Sig Dispense Refill  . albuterol (PROVENTIL HFA;VENTOLIN HFA) 108 (90 BASE) MCG/ACT inhaler Inhale 2 puffs into the lungs every 4 (four) hours as needed for shortness of breath.      Marland Kitchen atenolol (TENORMIN) 50 MG tablet Take 1 tablet (50 mg total) by mouth daily.  90 tablet  2  . CALCIUM-PHOSPHORUS PO Take by mouth daily.      . cetirizine (ZYRTEC) 10 MG tablet Take 10 mg by mouth every morning.       . ferrous sulfate 325 (65 FE) MG tablet Take 1 tablet (325 mg total) by mouth 2 (two)  times daily with a meal.    3  . fluticasone (FLONASE) 50 MCG/ACT nasal spray Place 1 spray into both nostrils daily.  16 g  2  . levofloxacin (LEVAQUIN) 500 MG tablet Take 1 tablet (500 mg total) by mouth daily.  7 tablet  0  . montelukast (SINGULAIR) 10 MG tablet TAKE 1 TABLET EVERY MORNING  90 tablet  2  . omeprazole (PRILOSEC) 20 MG capsule TAKE 1 CAPSULE EVERY       MORNING  90 capsule  2  . promethazine-codeine (PHENERGAN WITH CODEINE) 6.25-10 MG/5ML syrup Take 5 mLs by mouth every 4 (four) hours as needed (nausea/headache).  180 mL  0   No current facility-administered medications on file prior to visit.   Review of Systems  Constitutional: Negative for unexpected weight change, or unusual diaphoresis  HENT: Negative for tinnitus.   Eyes: Negative for photophobia and visual disturbance.  Respiratory: Negative for choking and stridor.   Gastrointestinal: Negative for vomiting and blood in stool.  Genitourinary: Negative for hematuria and decreased urine volume.  Musculoskeletal: Negative for acute joint swelling Skin: Negative for color change and wound.  Neurological: Negative for tremors and numbness other than noted  Psychiatric/Behavioral: Negative for decreased concentration or  hyperactivity.       Objective:   Physical Exam BP 106/84  Pulse 87  Temp(Src) 101.2 F (38.4 C) (Oral)  Wt 192  lb 4 oz (87.204 kg)  SpO2 97% VS noted, mild ill Constitutional: Pt appears well-developed and well-nourished./obese  HENT: Head: NCAT.  Right Ear: External ear normal.  Left Ear: External ear normal.  Eyes: Conjunctivae and EOM are normal. Pupils are equal, round, and reactive to light.  Neck: Normal range of motion. Neck supple.  Cardiovascular: Normal rate and regular rhythm.   Pulmonary/Chest: Effort normal and breath sounds normal.  - no rales or wheezing Abd:  Soft, NT, non-distended, + BS Neurological: Pt is alert. Not confused  Skin: Skin is warm. No erythema.    Psychiatric: Pt behavior is normal. Thought content normal.     Assessment & Plan:

## 2013-11-20 ENCOUNTER — Telehealth: Payer: Self-pay | Admitting: Internal Medicine

## 2013-11-20 NOTE — Telephone Encounter (Signed)
Relevant patient education assigned to patient using Emmi. ° °

## 2013-11-28 ENCOUNTER — Ambulatory Visit
Admission: RE | Admit: 2013-11-28 | Discharge: 2013-11-28 | Disposition: A | Discharge status: Home / Self Care | Payer: BC Managed Care – PPO | Source: Ambulatory Visit

## 2013-11-28 DIAGNOSIS — Z1231 Encounter for screening mammogram for malignant neoplasm of breast: Secondary | ICD-10-CM

## 2014-02-09 ENCOUNTER — Encounter (HOSPITAL_COMMUNITY): Payer: Self-pay | Admitting: Emergency Medicine

## 2014-02-09 ENCOUNTER — Emergency Department (HOSPITAL_COMMUNITY): Payer: BC Managed Care – PPO

## 2014-02-09 ENCOUNTER — Emergency Department (HOSPITAL_COMMUNITY)
Admission: EM | Admit: 2014-02-09 | Discharge: 2014-02-09 | Disposition: A | Discharge status: Home / Self Care | Payer: BC Managed Care – PPO | Attending: Emergency Medicine | Admitting: Emergency Medicine

## 2014-02-09 DIAGNOSIS — J45909 Unspecified asthma, uncomplicated: Secondary | ICD-10-CM | POA: Insufficient documentation

## 2014-02-09 DIAGNOSIS — Z8742 Personal history of other diseases of the female genital tract: Secondary | ICD-10-CM | POA: Insufficient documentation

## 2014-02-09 DIAGNOSIS — IMO0002 Reserved for concepts with insufficient information to code with codable children: Secondary | ICD-10-CM | POA: Insufficient documentation

## 2014-02-09 DIAGNOSIS — Z862 Personal history of diseases of the blood and blood-forming organs and certain disorders involving the immune mechanism: Secondary | ICD-10-CM | POA: Insufficient documentation

## 2014-02-09 DIAGNOSIS — K219 Gastro-esophageal reflux disease without esophagitis: Secondary | ICD-10-CM | POA: Insufficient documentation

## 2014-02-09 DIAGNOSIS — R0789 Other chest pain: Secondary | ICD-10-CM

## 2014-02-09 DIAGNOSIS — Z8639 Personal history of other endocrine, nutritional and metabolic disease: Secondary | ICD-10-CM | POA: Insufficient documentation

## 2014-02-09 DIAGNOSIS — Z79899 Other long term (current) drug therapy: Secondary | ICD-10-CM | POA: Insufficient documentation

## 2014-02-09 DIAGNOSIS — E663 Overweight: Secondary | ICD-10-CM | POA: Insufficient documentation

## 2014-02-09 LAB — D-DIMER, QUANTITATIVE: D-Dimer, Quant: 0.27 ug/mL-FEU (ref 0.00–0.48)

## 2014-02-09 MED ORDER — IBUPROFEN 600 MG PO TABS: 600.0000 mg | ORAL_TABLET | Freq: Four times a day (QID) | ORAL | Status: DC | PRN

## 2014-02-09 MED ORDER — KETOROLAC TROMETHAMINE 30 MG/ML IJ SOLN
30.0000 mg | Freq: Once | INTRAMUSCULAR | Status: AC
  Administered 2014-02-09: 30 mg via INTRAMUSCULAR
  Filled 2014-02-09: qty 1

## 2014-02-09 MED ORDER — HYDROCODONE-ACETAMINOPHEN 5-325 MG PO TABS: 1.0000 | ORAL_TABLET | Freq: Four times a day (QID) | ORAL | Status: DC | PRN

## 2014-02-09 NOTE — Discharge Instructions (Signed)

## 2014-02-09 NOTE — ED Provider Notes (Signed)
CSN: [id number]     Arrival date & time 02/09/14  0945 History   First MD Initiated Contact with Patient 02/09/14 1006     Chief Complaint  Patient presents with  . Chest Pain     (Consider location/radiation/quality/duration/timing/severity/associated sxs/prior Treatment) HPI  This is a 43 year old female with a history of palpitations, anemia, and hyperlipidemia who presents with chest pain. Patient reports waxing and waning chest pain under her left breast for one week. She reports the sharp. It is worse with movement and worse with deep inspiration. She denies any shortness of breath or leg swelling. It is not worse with exertion. Currently patient rates her pain a 3/10 when "I'm not moving." She took ibuprofen this morning that did not help. She denies any injury that she knows of. No recent fevers or cough.  Past Medical History  Diagnosis Date  . GERD (gastroesophageal reflux disease)   . Allergic rhinitis   . Hyperlipidemia   . Asthma   . Palpitations   . Heart palpitations   . Fibroids   . Anemia     Heavy menstrual flow/OTC FE BID:     Past Surgical History  Procedure Laterality Date  . No past surgeries     Family History  Problem Relation Age of Onset  . Hyperlipidemia Mother   . Hypertension Mother   . Colon cancer Father 65  . Hyperlipidemia Father   . Hypertension Father   . Prostate cancer Father 69   History  Substance Use Topics  . Smoking status: Never Smoker   . Smokeless tobacco: Current User    Types: Snuff     Comment: married to female partner [name] 12/2012, employed for [employer]  . Alcohol Use: Yes   OB History   Grav Para Term Preterm Abortions TAB SAB Ect Mult Living                 Review of Systems  Constitutional: Negative for fever.  Respiratory: Positive for chest tightness. Negative for cough and shortness of breath.   Cardiovascular: Positive for chest pain. Negative for leg swelling.  Gastrointestinal: Negative for  nausea, vomiting and abdominal pain.  Genitourinary: Negative for dysuria.  Skin: Negative for wound.  Neurological: Negative for headaches.  All other systems reviewed and are negative.     Allergies  Review of patient's allergies indicates no known allergies.  Home Medications   Prior to Admission medications   Medication Sig Start Date End Date Taking? Authorizing Provider  albuterol (PROVENTIL HFA;VENTOLIN HFA) 108 (90 BASE) MCG/ACT inhaler Inhale 2 puffs into the lungs every 4 (four) hours as needed for shortness of breath.   Yes Historical Provider, MD  atenolol (TENORMIN) 50 MG tablet Take 50 mg by mouth every morning.   Yes Historical Provider, MD  calcium-vitamin D (OSCAL WITH D) 500-200 MG-UNIT per tablet Take 1 tablet by mouth daily with breakfast.   Yes Historical Provider, MD  fluticasone (FLONASE) 50 MCG/ACT nasal spray Place 1 spray into both nostrils daily. 08/25/13  Yes Rowe Clack, MD  ibuprofen (ADVIL,MOTRIN) 200 MG tablet Take 400 mg by mouth every 6 (six) hours as needed for moderate pain.   Yes Historical Provider, MD  loratadine (CLARITIN) 10 MG tablet Take 10 mg by mouth every morning.   Yes Historical Provider, MD  montelukast (SINGULAIR) 10 MG tablet Take 10 mg by mouth every morning.   Yes Historical Provider, MD  omeprazole (PRILOSEC) 20 MG capsule Take 20 mg by  mouth every morning.   Yes Historical Provider, MD  HYDROcodone-acetaminophen (NORCO/VICODIN) 5-325 MG per tablet Take 1 tablet by mouth every 6 (six) hours as needed for moderate pain or severe pain. 02/09/14   Merryl Hacker, MD  ibuprofen (ADVIL,MOTRIN) 600 MG tablet Take 1 tablet (600 mg total) by mouth every 6 (six) hours as needed. 02/09/14   Merryl Hacker, MD   BP 131/81  Pulse 60  Temp(Src) 98.6 F (37 C) (Oral)  Resp 20  SpO2 99%  LMP 01/28/2014 Physical Exam  Nursing note and vitals reviewed. Constitutional: She is oriented to person, place, and time. She appears  well-developed and well-nourished. No distress.  Overweight  HENT:  Head: Normocephalic and atraumatic.  Cardiovascular: Normal rate, regular rhythm and normal heart sounds.   No murmur heard. Pulmonary/Chest: Effort normal and breath sounds normal. No respiratory distress. She has no wheezes. She exhibits tenderness.  Tenderness to palpation under the left breast, no obvious injury or ecchymosis  Abdominal: Soft. There is no tenderness.  Musculoskeletal: She exhibits no edema.  Neurological: She is alert and oriented to person, place, and time.  Skin: Skin is warm and dry.  Psychiatric: She has a normal mood and affect.    ED Course  Procedures (including critical care time) Labs Review Labs Reviewed  D-DIMER, QUANTITATIVE    Imaging Review Dg Chest 2 View  02/09/2014   CLINICAL DATA:  Left-sided chest pain.  Asthma.  EXAM: CHEST  2 VIEW  COMPARISON:  None.  FINDINGS: The heart size and mediastinal contours are within normal limits. Both lungs are clear. The visualized skeletal structures are unremarkable.  IMPRESSION: No active cardiopulmonary disease.   Electronically Signed   By: Earle Gell M.D.   On: 02/09/2014 10:35     EKG Interpretation   Date/Time:  Monday February 09 2014 10:01:31 EDT Ventricular Rate:  57 PR Interval:  158 QRS Duration: 84 QT Interval:  407 QTC Calculation: 396 R Axis:   61 Text Interpretation:  Sinus rhythm Low voltage, precordial leads No prior  Confirmed by HORTON  MD, COURTNEY ([id number]) on 02/09/2014 10:07:47 AM      MDM   Final diagnoses:  Musculoskeletal chest pain    Patient presents with chest pain under her left breast. It is reproducible on exam. Also there is a pleuritic component. She is nontoxic-appearing. EKG is nonischemic. Story is not consistent with ACS. Chest x-ray shows no evidence of pneumothorax. Screening d-dimer was obtained and is negative. Patient was given Toradol. She will be discharged home with ibuprofen and Norco for  musculoskeletal chest pain.  After history, exam, and medical workup I feel the patient has been appropriately medically screened and is safe for discharge home. Pertinent diagnoses were discussed with the patient. Patient was given return precautions.     Merryl Hacker, MD 02/09/14 [phone]

## 2014-02-09 NOTE — ED Notes (Addendum)
Pt c/o pain under left breast for about 1 week now. Denies headache , n/v/d, or SOB. 5/10 pain

## 2014-02-11 ENCOUNTER — Other Ambulatory Visit: Payer: Self-pay | Admitting: Internal Medicine

## 2014-02-13 ENCOUNTER — Encounter: Payer: Self-pay | Admitting: Internal Medicine

## 2014-02-13 ENCOUNTER — Encounter: Payer: Self-pay | Admitting: *Deleted

## 2014-02-13 ENCOUNTER — Other Ambulatory Visit (INDEPENDENT_AMBULATORY_CARE_PROVIDER_SITE_OTHER): Payer: BC Managed Care – PPO

## 2014-02-13 ENCOUNTER — Ambulatory Visit (INDEPENDENT_AMBULATORY_CARE_PROVIDER_SITE_OTHER): Payer: BC Managed Care – PPO | Admitting: Internal Medicine

## 2014-02-13 VITALS — BP 112/84 | HR 73 | Temp 98.7°F | Wt 198.1 lb

## 2014-02-13 DIAGNOSIS — R1012 Left upper quadrant pain: Secondary | ICD-10-CM

## 2014-02-13 LAB — CBC WITH DIFFERENTIAL/PLATELET
Basophils Absolute: 0 10*3/uL (ref 0.0–0.1)
Basophils Relative: 0.6 % (ref 0.0–3.0)
Eosinophils Absolute: 0.3 10*3/uL (ref 0.0–0.7)
Eosinophils Relative: 4.3 % (ref 0.0–5.0)
HEMATOCRIT: 40.4 % (ref 36.0–46.0)
Hemoglobin: 13.6 g/dL (ref 12.0–15.0)
Lymphocytes Relative: 27.5 % (ref 12.0–46.0)
Lymphs Abs: 2.1 10*3/uL (ref 0.7–4.0)
MCHC: 33.5 g/dL (ref 30.0–36.0)
MCV: 93 fl (ref 78.0–100.0)
MONO ABS: 0.5 10*3/uL (ref 0.1–1.0)
Monocytes Relative: 7 % (ref 3.0–12.0)
NEUTROS PCT: 60.6 % (ref 43.0–77.0)
Neutro Abs: 4.6 10*3/uL (ref 1.4–7.7)
Platelets: 300 10*3/uL (ref 150.0–400.0)
RBC: 4.34 Mil/uL (ref 3.87–5.11)
RDW: 12.9 % (ref 11.5–15.5)
WBC: 7.6 10*3/uL (ref 4.0–10.5)

## 2014-02-13 LAB — URINALYSIS, ROUTINE W REFLEX MICROSCOPIC
Bilirubin Urine: NEGATIVE
HGB URINE DIPSTICK: NEGATIVE
KETONES UR: NEGATIVE
Leukocytes, UA: NEGATIVE
Nitrite: NEGATIVE
Specific Gravity, Urine: 1.02 (ref 1.000–1.030)
Total Protein, Urine: NEGATIVE
UROBILINOGEN UA: 0.2 (ref 0.0–1.0)
Urine Glucose: NEGATIVE
pH: 8.5 — AB (ref 5.0–8.0)

## 2014-02-13 LAB — HEPATIC FUNCTION PANEL
ALT: 64 U/L — AB (ref 0–35)
AST: 35 U/L (ref 0–37)
Albumin: 3.6 g/dL (ref 3.5–5.2)
Alkaline Phosphatase: 51 U/L (ref 39–117)
BILIRUBIN DIRECT: 0.1 mg/dL (ref 0.0–0.3)
Total Bilirubin: 0.2 mg/dL (ref 0.2–1.2)
Total Protein: 6.7 g/dL (ref 6.0–8.3)

## 2014-02-13 LAB — BASIC METABOLIC PANEL
BUN: 10 mg/dL (ref 6–23)
CHLORIDE: 108 meq/L (ref 96–112)
CO2: 26 mEq/L (ref 19–32)
CREATININE: 0.7 mg/dL (ref 0.4–1.2)
Calcium: 9.1 mg/dL (ref 8.4–10.5)
GFR: 99.27 mL/min (ref >60.00)
Glucose, Bld: 102 mg/dL — ABNORMAL HIGH (ref 70–99)
POTASSIUM: 3.9 meq/L (ref 3.5–5.1)
SODIUM: 141 meq/L (ref 135–145)

## 2014-02-13 LAB — LIPASE: LIPASE: 17 U/L (ref 11.0–59.0)

## 2014-02-13 NOTE — Progress Notes (Signed)
Subjective:    Patient ID: Katherine Rosario, female    DOB: 1971-04-09, 43 y.o.   MRN: [id number]  Abdominal Pain Episode onset: 3 weeks ago, ED eval for same 02/09/14. The onset quality is gradual. The problem occurs constantly. The problem has been gradually worsening. The pain is located in the LUQ. The pain is moderate. The quality of the pain is dull and aching. The abdominal pain does not radiate. Associated symptoms include myalgias and nausea (intermittent). Pertinent negatives include no belching, constipation, diarrhea, dysuria, fever, headaches, hematochezia, melena or vomiting. The pain is aggravated by certain positions (direct pressure to site at lower ribs). The pain is relieved by certain positions. She has tried oral narcotic analgesics (ibuprofen 600 prn) for the symptoms. The treatment provided no relief. There is no history of abdominal surgery, gallstones, GERD, irritable bowel syndrome, pancreatitis or PUD. no trauma or MVA    Also reviewed chronic medical issues and interval medical events  Past Medical History  Diagnosis Date  . GERD (gastroesophageal reflux disease)   . Allergic rhinitis   . Hyperlipidemia   . Asthma   . Palpitations   . Heart palpitations   . Fibroids   . Anemia     Heavy menstrual flow/OTC FE BID:      Review of Systems  Constitutional: Positive for fatigue (overwhelming). Negative for fever.  Gastrointestinal: Positive for nausea (intermittent) and abdominal pain. Negative for vomiting, diarrhea, constipation, melena and hematochezia.  Genitourinary: Negative for dysuria.  Musculoskeletal: Positive for myalgias.  Neurological: Negative for headaches.       Objective:   Physical Exam  BP 112/84  Pulse 73  Temp(Src) 98.7 F (37.1 C) (Oral)  Wt 198 lb 1.9 oz (89.867 kg)  SpO2 96%  LMP 01/28/2014 Wt Readings from Last 3 Encounters:  02/13/14 198 lb 1.9 oz (89.867 kg)  11/19/13 192 lb 4 oz (87.204 kg)  08/25/13 189 lb 12.8 oz (86.093  kg)   Constitutional: She is obese, but appears well-developed and well-nourished. No distress. spouse at side Neck: Normal range of motion. Neck supple. No JVD present. No thyromegaly present.  Cardiovascular: Normal rate, regular rhythm and normal heart sounds.  No murmur heard. No BLE edema. Pulmonary/Chest: Effort normal and breath sounds normal. No respiratory distress. She has no wheezes.  Abdomen: obese, soft, tender to palpation over left lower costal margin, reproducible - no mass, +BS Psychiatric: She has a normal mood and affect. Her behavior is normal. Judgment and thought content normal.   Lab Results  Component Value Date   WBC 10.7* 08/25/2013   HGB 13.6 08/25/2013   HCT 40.8 08/25/2013   PLT 314.0 08/25/2013   GLUCOSE 81 08/25/2013   CHOL 211* 08/25/2013   TRIG 219.0* 08/25/2013   HDL 44.70 08/25/2013   LDLDIRECT 150.8 08/25/2013   LDLCALC 159 07/10/2013   ALT 33 08/25/2013   AST 23 08/25/2013   NA 137 08/25/2013   K 4.3 08/25/2013   CL 101 08/25/2013   CREATININE 0.8 08/25/2013   BUN 11 08/25/2013   CO2 28 08/25/2013   TSH 3.24 08/25/2013   INR 0.97 02/07/2013    Dg Chest 2 View  02/09/2014   CLINICAL DATA:  Left-sided chest pain.  Asthma.  EXAM: CHEST  2 VIEW  COMPARISON:  None.  FINDINGS: The heart size and mediastinal contours are within normal limits. Both lungs are clear. The visualized skeletal structures are unremarkable.  IMPRESSION: No active cardiopulmonary disease.   Electronically Signed  By: Earle Gell M.D.   On: 02/09/2014 10:35       Assessment & Plan:   Left upper quadrant pain x3 weeks Associated with overwhelming fatigue, reproducible exam  Emergency room evaluation June 8 reviewed, negative d-dimer, negative ECG and chest x-ray  Check labs now  Plan CT abdomen if unremarkable cause

## 2014-02-13 NOTE — Patient Instructions (Signed)
It was good to see you today.  We have reviewed your prior records including labs and tests today  Test(s) ordered today. Your results will be released to [facility] (or called to you) after review, usually within 72hours after test completion. If any changes need to be made, you will be notified at that same time.  Medications reviewed and updated, no changes recommended at this time. continue ibuprofen 600mg  3 times daily and hydrocodone if needed for severe pain  If labs unremarkable and continued pain, we'll plan for CT scan as discussed   Please schedule followup in 3-4 months, call sooner if problems.

## 2014-02-13 NOTE — Progress Notes (Signed)
Pre visit review using our clinic review tool, if applicable. No additional management support is needed unless otherwise documented below in the visit note. 

## 2014-02-16 ENCOUNTER — Encounter: Payer: Self-pay | Admitting: Internal Medicine

## 2014-02-16 DIAGNOSIS — M25552 Pain in left hip: Secondary | ICD-10-CM

## 2014-02-16 DIAGNOSIS — R1032 Left lower quadrant pain: Secondary | ICD-10-CM

## 2014-02-20 ENCOUNTER — Ambulatory Visit (INDEPENDENT_AMBULATORY_CARE_PROVIDER_SITE_OTHER)
Admission: RE | Admit: 2014-02-20 | Discharge: 2014-02-20 | Disposition: A | Discharge status: Home / Self Care | Payer: BC Managed Care – PPO | Source: Ambulatory Visit | Attending: Internal Medicine | Admitting: Internal Medicine

## 2014-02-20 DIAGNOSIS — M25559 Pain in unspecified hip: Secondary | ICD-10-CM

## 2014-02-20 DIAGNOSIS — Z0279 Encounter for issue of other medical certificate: Secondary | ICD-10-CM

## 2014-02-20 DIAGNOSIS — R1032 Left lower quadrant pain: Secondary | ICD-10-CM

## 2014-02-20 DIAGNOSIS — M25552 Pain in left hip: Secondary | ICD-10-CM

## 2014-02-20 MED ORDER — IOHEXOL 300 MG/ML  SOLN
100.0000 mL | Freq: Once | INTRAMUSCULAR | Status: AC | PRN
  Administered 2014-02-20: 100 mL via INTRAVENOUS

## 2014-02-23 ENCOUNTER — Ambulatory Visit: Payer: BC Managed Care – PPO | Admitting: Internal Medicine

## 2014-03-12 ENCOUNTER — Encounter: Payer: Self-pay | Admitting: Internal Medicine

## 2014-03-18 ENCOUNTER — Other Ambulatory Visit: Payer: Self-pay | Admitting: Internal Medicine

## 2014-03-19 ENCOUNTER — Encounter: Payer: Self-pay | Admitting: Internal Medicine

## 2014-05-01 ENCOUNTER — Encounter: Payer: Self-pay | Admitting: Internal Medicine

## 2014-05-01 ENCOUNTER — Other Ambulatory Visit (INDEPENDENT_AMBULATORY_CARE_PROVIDER_SITE_OTHER): Payer: BC Managed Care – PPO

## 2014-05-01 ENCOUNTER — Ambulatory Visit (INDEPENDENT_AMBULATORY_CARE_PROVIDER_SITE_OTHER): Payer: BC Managed Care – PPO | Admitting: Internal Medicine

## 2014-05-01 VITALS — BP 110/80 | HR 70 | Temp 98.9°F | Wt 204.8 lb

## 2014-05-01 DIAGNOSIS — M2550 Pain in unspecified joint: Secondary | ICD-10-CM

## 2014-05-01 DIAGNOSIS — R509 Fever, unspecified: Secondary | ICD-10-CM

## 2014-05-01 DIAGNOSIS — R5381 Other malaise: Secondary | ICD-10-CM

## 2014-05-01 DIAGNOSIS — M545 Low back pain, unspecified: Secondary | ICD-10-CM

## 2014-05-01 DIAGNOSIS — Z9189 Other specified personal risk factors, not elsewhere classified: Secondary | ICD-10-CM

## 2014-05-01 DIAGNOSIS — Z87898 Personal history of other specified conditions: Secondary | ICD-10-CM

## 2014-05-01 DIAGNOSIS — R5383 Other fatigue: Secondary | ICD-10-CM

## 2014-05-01 LAB — T4, FREE: Free T4: 0.67 ng/dL (ref 0.60–1.60)

## 2014-05-01 LAB — CBC WITH DIFFERENTIAL/PLATELET
BASOS PCT: 1.5 % (ref 0.0–3.0)
Basophils Absolute: 0.1 10*3/uL (ref 0.0–0.1)
EOS PCT: 3.8 % (ref 0.0–5.0)
Eosinophils Absolute: 0.3 10*3/uL (ref 0.0–0.7)
HEMATOCRIT: 42.3 % (ref 36.0–46.0)
Hemoglobin: 14.1 g/dL (ref 12.0–15.0)
LYMPHS ABS: 2.1 10*3/uL (ref 0.7–4.0)
Lymphocytes Relative: 26 % (ref 12.0–46.0)
MCHC: 33.3 g/dL (ref 30.0–36.0)
MCV: 93.3 fl (ref 78.0–100.0)
Monocytes Absolute: 0.6 10*3/uL (ref 0.1–1.0)
Monocytes Relative: 7.3 % (ref 3.0–12.0)
Neutro Abs: 5 10*3/uL (ref 1.4–7.7)
Neutrophils Relative %: 61.4 % (ref 43.0–77.0)
Platelets: 372 10*3/uL (ref 150.0–400.0)
RBC: 4.54 Mil/uL (ref 3.87–5.11)
RDW: 13.2 % (ref 11.5–15.5)
WBC: 8.1 10*3/uL (ref 4.0–10.5)

## 2014-05-01 LAB — URINALYSIS, ROUTINE W REFLEX MICROSCOPIC
Bilirubin Urine: NEGATIVE
Ketones, ur: NEGATIVE
Nitrite: NEGATIVE
PH: 6 (ref 5.0–8.0)
Specific Gravity, Urine: 1.02 (ref 1.000–1.030)
TOTAL PROTEIN, URINE-UPE24: NEGATIVE
URINE GLUCOSE: NEGATIVE
Urobilinogen, UA: 0.2 (ref 0.0–1.0)

## 2014-05-01 LAB — SEDIMENTATION RATE: SED RATE: 15 mm/h (ref 0–22)

## 2014-05-01 LAB — RHEUMATOID FACTOR

## 2014-05-01 LAB — HEPATIC FUNCTION PANEL
ALT: 69 U/L — AB (ref 0–35)
AST: 40 U/L — AB (ref 0–37)
Albumin: 3.9 g/dL (ref 3.5–5.2)
Alkaline Phosphatase: 68 U/L (ref 39–117)
BILIRUBIN DIRECT: 0.1 mg/dL (ref 0.0–0.3)
TOTAL PROTEIN: 7.4 g/dL (ref 6.0–8.3)
Total Bilirubin: 0.4 mg/dL (ref 0.2–1.2)

## 2014-05-01 LAB — BASIC METABOLIC PANEL
BUN: 12 mg/dL (ref 6–23)
CHLORIDE: 107 meq/L (ref 96–112)
CO2: 25 mEq/L (ref 19–32)
Calcium: 9.8 mg/dL (ref 8.4–10.5)
Creatinine, Ser: 0.9 mg/dL (ref 0.4–1.2)
GFR: 70.86 mL/min (ref >60.00)
Glucose, Bld: 90 mg/dL (ref 70–99)
POTASSIUM: 3.8 meq/L (ref 3.5–5.1)
SODIUM: 139 meq/L (ref 135–145)

## 2014-05-01 LAB — TSH: TSH: 1.22 u[IU]/mL (ref 0.35–4.50)

## 2014-05-01 NOTE — Progress Notes (Signed)
Pre visit review using our clinic review tool, if applicable. No additional management support is needed unless otherwise documented below in the visit note. 

## 2014-05-01 NOTE — Patient Instructions (Signed)
Your next office appointment will be determined based upon review of your pending labs . Those instructions will be transmitted to you through My Chart   Followup as needed for your acute issue. Please report any significant change in your symptoms.  NAIDS ( Aleve, Advil, Naproxen) or Tylenol every 4 hrs as needed for fever as discussed based on label recommendations  If the extensive studies to assess cause of fatigue are negative; sleep apnea evaluation should be pursued.

## 2014-05-01 NOTE — Progress Notes (Signed)
   Subjective:    Patient ID: Katherine Rosario, female    DOB: [birth year], 43 y.o.   MRN: [id number]  HPI  She's had fatigue for several months without specific trigger. This is described as loss of energy which is constant ,even at rest. Over the last 3 months she's experienced a 20 pound weight gain. Over the last few weeks he's had intermittent low-grade fevers. Last week she had some ankle edema which has resolved. She has pain and swelling in her knees and elbows.She also has had some LS area pain w/o sciatica. There is no associated rash She has weakness in the left arm. Her hands feel tight. She also has some discomfort in the left forearm. She's been told she has excessive snoring without apnea  Her mother has lupus.      Review of Systems  She denies blurred vision, double vision, loss of vision She has no significant extrinsic symptoms of itchy, watery eyes, sneezing, or angioedema. She has no hoarseness or difficulty swallowing Although she has a history of asthma this is not an active issue at this time There is no sputum production or hemoptysis She denies chest pain, palpitations, or paroxysmal nocturnal dyspnea She has no constipation, diarrhea, melena, rectal bleeding She's had no exposure to ticks .No new rashes. There are no new changes in her hair, skin, or nails She has no abnormal bruising or bleeding.       Objective:   Physical Exam   Pertinent or positive findings include:  As per CDC Guidelines ,Epic documents obesity as being present . There is slight increase to the second heart sound. Skin slightly damp. Dorsalis pedis pulses are slightly decreased. Abdomen is protuberant. She has dullness in both upper quadrants without definite organomegaly The great toenails are absent.  General appearance :adequately nourished; in no distress. Eyes: No conjunctival inflammation or scleral icterus is present. No lid lag or proptosis. Oral exam: Dental hygiene  is good. Lips and gums are healthy appearing.There is no oropharyngeal erythema or exudate noted.  Heart:  Normal rate and regular rhythm. S1 normal without gallop, murmur, click, rub or other extra sounds   Lungs:Chest clear to auscultation; no wheezes, rhonchi,rales ,or rubs present.No increased work of breathing.  Abdomen: bowel sounds normal, soft and non-tender without masses, organomegaly or hernias noted.  No guarding or rebound.  Skin:Warm & dry.  Intact without suspicious lesions or rashes ; no jaundice or tenting Lymphatic: No lymphadenopathy is noted about the head, neck, axilla Deep tendon reflexes are equal and normal. There is no meningismus. Straight leg raising is negative.          Assessment & Plan:#1 fatigue  #2 low-grade fever  #3 arthralgias, multiple joints  Plan: See orders and recommendations   #1 fatigue #2 arthralgias #3 fever #4 hx of snoring #5 LBP ee orders & AVS

## 2014-05-02 ENCOUNTER — Other Ambulatory Visit: Payer: Self-pay | Admitting: Internal Medicine

## 2014-05-02 DIAGNOSIS — R7401 Elevation of levels of liver transaminase levels: Secondary | ICD-10-CM | POA: Insufficient documentation

## 2014-05-02 DIAGNOSIS — R509 Fever, unspecified: Secondary | ICD-10-CM

## 2014-05-02 DIAGNOSIS — R740 Nonspecific elevation of levels of transaminase and lactic acid dehydrogenase [LDH]: Secondary | ICD-10-CM

## 2014-05-02 DIAGNOSIS — R5383 Other fatigue: Secondary | ICD-10-CM | POA: Insufficient documentation

## 2014-05-02 DIAGNOSIS — R5381 Other malaise: Secondary | ICD-10-CM

## 2014-05-02 DIAGNOSIS — R7402 Elevation of levels of lactic acid dehydrogenase (LDH): Secondary | ICD-10-CM | POA: Insufficient documentation

## 2014-05-04 ENCOUNTER — Telehealth: Payer: Self-pay

## 2014-05-04 ENCOUNTER — Other Ambulatory Visit: Payer: BC Managed Care – PPO

## 2014-05-04 ENCOUNTER — Other Ambulatory Visit: Payer: Self-pay | Admitting: Internal Medicine

## 2014-05-04 DIAGNOSIS — R8290 Unspecified abnormal findings in urine: Secondary | ICD-10-CM

## 2014-05-04 DIAGNOSIS — R509 Fever, unspecified: Secondary | ICD-10-CM

## 2014-05-04 DIAGNOSIS — R5383 Other fatigue: Secondary | ICD-10-CM

## 2014-05-04 DIAGNOSIS — R5381 Other malaise: Secondary | ICD-10-CM

## 2014-05-04 DIAGNOSIS — R7402 Elevation of levels of lactic acid dehydrogenase (LDH): Secondary | ICD-10-CM

## 2014-05-04 DIAGNOSIS — R740 Nonspecific elevation of levels of transaminase and lactic acid dehydrogenase [LDH]: Secondary | ICD-10-CM

## 2014-05-04 NOTE — Telephone Encounter (Signed)
Request for add on has been faxed to lab 

## 2014-05-04 NOTE — Telephone Encounter (Signed)
Message copied by Shelly Coss on Mon May 04, 2014  8:42 AM ------      Message from: Hendricks Limes      Created: Sat May 02, 2014  7:00 AM       Please order urine culture; thanks ------

## 2014-05-04 NOTE — Telephone Encounter (Signed)
Call from [facility] lab stating urine culture can not be added on since it's been over 24 hours.

## 2014-05-05 LAB — HEPATITIS PANEL, ACUTE
HCV Ab: NEGATIVE
Hep A IgM: NONREACTIVE
Hep B C IgM: NONREACTIVE
Hepatitis B Surface Ag: NEGATIVE

## 2014-05-05 LAB — URINE CULTURE
Colony Count: NO GROWTH
Organism ID, Bacteria: NO GROWTH

## 2014-05-06 ENCOUNTER — Encounter: Payer: Self-pay | Admitting: Internal Medicine

## 2014-05-06 ENCOUNTER — Other Ambulatory Visit: Payer: Self-pay | Admitting: Internal Medicine

## 2014-05-06 DIAGNOSIS — G4733 Obstructive sleep apnea (adult) (pediatric): Secondary | ICD-10-CM

## 2014-05-06 DIAGNOSIS — R5383 Other fatigue: Secondary | ICD-10-CM

## 2014-05-06 DIAGNOSIS — R5381 Other malaise: Secondary | ICD-10-CM

## 2014-05-06 DIAGNOSIS — Z87898 Personal history of other specified conditions: Secondary | ICD-10-CM

## 2014-05-06 HISTORY — DX: Obstructive sleep apnea (adult) (pediatric): G47.33

## 2014-05-18 ENCOUNTER — Telehealth: Payer: Self-pay | Admitting: Internal Medicine

## 2014-05-18 ENCOUNTER — Other Ambulatory Visit (INDEPENDENT_AMBULATORY_CARE_PROVIDER_SITE_OTHER): Payer: BC Managed Care – PPO

## 2014-05-18 ENCOUNTER — Ambulatory Visit (INDEPENDENT_AMBULATORY_CARE_PROVIDER_SITE_OTHER): Payer: BC Managed Care – PPO | Admitting: Internal Medicine

## 2014-05-18 ENCOUNTER — Encounter: Payer: Self-pay | Admitting: Internal Medicine

## 2014-05-18 VITALS — BP 112/82 | HR 66 | Temp 98.4°F | Wt 203.1 lb

## 2014-05-18 DIAGNOSIS — R42 Dizziness and giddiness: Secondary | ICD-10-CM

## 2014-05-18 DIAGNOSIS — R0789 Other chest pain: Secondary | ICD-10-CM

## 2014-05-18 DIAGNOSIS — R002 Palpitations: Secondary | ICD-10-CM

## 2014-05-18 DIAGNOSIS — R5381 Other malaise: Secondary | ICD-10-CM

## 2014-05-18 DIAGNOSIS — R5383 Other fatigue: Secondary | ICD-10-CM

## 2014-05-18 LAB — MAGNESIUM: Magnesium: 2.1 mg/dL (ref 1.5–2.5)

## 2014-05-18 NOTE — Telephone Encounter (Signed)
Message per Driscilla Grammes, RN on 05/18/14 at 1123  Patient Information: Caller Name: Hope Phone: [phone] Patient: Katherine Rosario Gender: Female DOB: 01-Mar-1971 Age: 43 Years PCP: Gwendolyn Grant (Adults only) Pregnant: No  Office Follow Up: Does the office need to follow up with this patient?: No Instructions For The Office: N/A  RN Note: Pt has appt at 1500 in office, and will contact office if symptoms change or worsen, or if new symptoms, such as breathing difficulty occur.  Symptoms Reason For Call & Symptoms: Pt tranferred from office for triage, after scheduling an appt for heart palpitations.  Pt states that she does have a history of these, and that this is not a new symptom.  Pt is on Atenolol 50mg  qd for this.  Pt states no new symptoms today, as the intermittent chest pain, palpitations and generalized weakness have been going on for quite some time.  Pt states HR has been between 66-76 today with no episodes of tachycardia.  Pt states that her chest pain seems to be located on the outside of her chest, and her chest is tender to touch.  Pt denies SOB, and does state that she was in a car for 5 hours almost 2 weeks ago, but no travel since that time. No chest pain noted at time of triage. Reviewed Health History In EMR: Yes Reviewed Medications In EMR: Yes Reviewed Allergies In EMR: Yes Reviewed Surgeries / Procedures: Yes Date of Onset of Symptoms: 05/15/2014 OB / GYN: LMP: 12/03/2013  Guideline(s) Used: Chest Pain  Disposition Per Guideline:   Go to ED Now (or to Office with PCP Approval)  Reason For Disposition Reached:   Intermittent chest pain and pain has been increasing in severity or frequency  Advice Given: Call Back If: Severe chest pain Difficulty breathing You become worse.  Patient Will Follow Care Advice: YES  Appointment Scheduled: 05/18/2014 15:00:00 Appointment Scheduled Provider: Unice Cobble

## 2014-05-18 NOTE — Progress Notes (Signed)
Pre visit review using our clinic review tool, if applicable. No additional management support is needed unless otherwise documented below in the visit note. 

## 2014-05-18 NOTE — Progress Notes (Signed)
   Subjective:    Patient ID: Katherine Rosario, female    DOB: 1971-06-28, 43 y.o.   MRN: [id number]  HPI  She presents with 3 concerns.  She has had near-syncope in the context of dizziness beginning 9/11. Initial episode after she stood up to walk after being in bed. She states that she felt dizzy and near syncopal.  She had another episode today after getting out of the shower.  Additionally she's had intermittent palpitations described as intermittent fluttering lasting seconds and which  have occurred everyday since 9/11.  She also describes aching localized chest pain which last seconds and is nonexertional.Last episode this morning.  She has a past history of Holter monitoring in 2000 ; she was placed on atenolol 50 mg daily at that time. She's unsure of the diagnosis.  No cardiac or neuro prodrome prior to these symptoms.  She has decreased her caffeine intake to one cup a day. She denies any other stimulants.    She denies any associated GI symptoms.  She has had no constitutional symptoms.      Review of Systems    Denied were any change in heart rhythm or rate prior to the event. There was no associated chest pain or shortness of breath .  Also specifically denied prior to the episode were headache, limb weakness, tingling, or numbness. No seizure activity noted.   Unexplained weight loss, abdominal pain, significant dyspepsia, dysphagia, melena, rectal bleeding, or persistently small caliber stools are denied.     Objective:   Physical Exam   Significant or distinguishing  findings on physical exam include:  As per CDC Guidelines ,Epic documents obesity as being present . Varus knee changes.  Appears healthy and well-nourished & in no acute distress  No carotid bruits are present.No neck pain distention present at 10 - 15 degrees. Thyroid normal to palpation  Heart rhythm and rate are normal with no gallop or murmur  Chest is clear with no increased work of  breathing  There is no evidence of aortic aneurysm or renal artery bruits  Abdomen soft with no organomegaly or masses. No HJR  No clubbing, cyanosis or edema present.  Pedal pulses are intact   No ischemic skin changes are present . Fingernails/ toenails healthy   Alert and oriented. Strength, tone, DTRs reflexes normal     Supine BP: 108/70 Sitting BP: 98/64 Standing BP: 92/62 Symptoms with BP drop but no change in pulse                    Assessment & Plan:  #1 fatigue; sleep apnea study is pending in 2 weeks.  #2 palpitations since 9/11  #3 atypical chest pain. EKG reveals poor R-wave progression across precordium without ischemic changes. Since 6/ 15 there has been some change in the T voltage in lead 3. It is now biphasic.  #4 paroxysmal dizziness and near syncope  #5 postural hypotension  Plan: Decrease atenolol to one half daily to prevent excessive slowing and possible escape beats.  Cardiology referral for possible Holter or event monitor &  possible stress test.  Troponin & Mg++ levels

## 2014-05-18 NOTE — Patient Instructions (Signed)
To prevent palpitations or premature beats, avoid stimulants such as decongestants, diet pills, nicotine, or caffeine (coffee, tea, cola, or chocolate) to excess. Perform isometric exercise of calves  ( while seated go up on toes to count of 5 & then onto heels for 5 count). Repeat  4- 5 times prior to standing if you've been seated or supine for any significant period of time as BP drops with such positions.

## 2014-05-19 LAB — TROPONIN I

## 2014-05-22 ENCOUNTER — Ambulatory Visit: Payer: BC Managed Care – PPO | Admitting: Internal Medicine

## 2014-06-09 ENCOUNTER — Ambulatory Visit (INDEPENDENT_AMBULATORY_CARE_PROVIDER_SITE_OTHER): Payer: BC Managed Care – PPO | Admitting: Internal Medicine

## 2014-06-09 ENCOUNTER — Encounter: Payer: Self-pay | Admitting: Internal Medicine

## 2014-06-09 VITALS — BP 92/70 | HR 89 | Temp 99.2°F | Resp 13 | Wt 205.5 lb

## 2014-06-09 DIAGNOSIS — H60392 Other infective otitis externa, left ear: Secondary | ICD-10-CM

## 2014-06-09 DIAGNOSIS — I952 Hypotension due to drugs: Secondary | ICD-10-CM

## 2014-06-09 DIAGNOSIS — J209 Acute bronchitis, unspecified: Secondary | ICD-10-CM

## 2014-06-09 MED ORDER — AMOXICILLIN 500 MG PO CAPS: 500.0000 mg | ORAL_CAPSULE | Freq: Three times a day (TID) | ORAL | Status: DC

## 2014-06-09 MED ORDER — NEOMYCIN-POLYMYXIN-HC 1 % OT SOLN: 3.0000 [drp] | Freq: Four times a day (QID) | OTIC | Status: DC

## 2014-06-09 MED ORDER — HYDROCODONE-HOMATROPINE 5-1.5 MG/5ML PO SYRP: 5.0000 mL | ORAL_SOLUTION | Freq: Four times a day (QID) | ORAL | Status: DC | PRN

## 2014-06-09 NOTE — Progress Notes (Signed)
Pre visit review using our clinic review tool, if applicable. No additional management support is needed unless otherwise documented below in the visit note. 

## 2014-06-09 NOTE — Progress Notes (Signed)
   Subjective:    Patient ID: Katherine Rosario, female    DOB: 04-28-1971, 44 y.o.   MRN: [id number]  HPI   Symptoms began 06/05/14 as head and chest congestion with nonproductive cough. She also had sore throat and itchy, watery eyes and sneezing.  She took Advil cold and sinus as well as Robitussin with minimal response  Now she is unable to sleep because of cough and pain in the left ear.  Her blood pressure remains low despite decreasing atenolol from 50 mg to one half pill daily. This was prescribed following an ablation procedure remotely   Review of Systems   She denies frontal headache, facial pain, nasal purulence, sputum production, or shortness of breath  She also has no fever, chills, or sweats.        Objective:   Physical Exam  Positive or pertinent findings include: There is marked erythema and suggestion of slight hemorrhage of the left tympanic membrane. There is erythema and oropharynx without exudate She has a brassy nonproductive cough.  General appearance:well nourished; no acute distress or increased work of breathing is present.  No  lymphadenopathy about the head, neck, or axilla noted.  Eyes: No conjunctival inflammation or lid edema is present. There is no scleral icterus. Ears:  External ear exam shows no significant lesions or deformities.  Otoscopic examination reveals clear canals. R TM normal. Nose:  External nasal examination shows no deformity or inflammation. Nasal mucosa are pink and moist without lesions or exudates. No septal dislocation or deviation.No obstruction to airflow.  Oral exam: Dental hygiene is good; lips and gums are healthy appearing. Neck:  No deformities, thyromegaly, masses, or tenderness noted.   Supple with full range of motion without pain. Heart:  Normal rate and regular rhythm. S1 and S2 normal without gallop, murmur, click, rub or other extra sounds.  Lungs:Chest clear to auscultation; no wheezes, rhonchi,rales ,or rubs  present.No increased work of breathing.   Extremities:  No cyanosis, edema, or clubbing  noted  Skin: Warm & dry w/o jaundice or tenting.           Assessment & Plan:  #1 otitis externa  #2 acute bronchitis  #3 relative hypotension  Plan: See orders  & recommendations

## 2014-06-09 NOTE — Patient Instructions (Signed)
Plain Mucinex (NOT D) for thick secretions ;force NON dairy fluids .   Nasal cleansing in the shower as discussed with lather of mild shampoo.After 10 seconds wash off lather while  exhaling through nostrils. Make sure that all residual soap is removed to prevent irritation.  Flonase OR Nasacort AQ 1 spray in each nostril twice a day as needed. Use the "crossover" technique into opposite nostril spraying toward opposite ear @ 45 degree angle, not straight up into nostril.  Use a Neti pot daily only  as needed for significant sinus congestion; going from open side to congested side . Plain Allegra (NOT D )  160 daily , Loratidine 10 mg , OR Zyrtec 10 mg @ bedtime  as needed for itchy eyes & sneezing.  Decrease Atenolol to 1/4 pill (12.5 mg) daily

## 2014-06-15 ENCOUNTER — Ambulatory Visit (INDEPENDENT_AMBULATORY_CARE_PROVIDER_SITE_OTHER): Payer: BC Managed Care – PPO | Admitting: Pulmonary Disease

## 2014-06-15 ENCOUNTER — Encounter: Payer: Self-pay | Admitting: Pulmonary Disease

## 2014-06-15 VITALS — BP 110/82 | HR 71 | Temp 98.3°F | Ht 62.0 in | Wt 209.4 lb

## 2014-06-15 DIAGNOSIS — Z8489 Family history of other specified conditions: Secondary | ICD-10-CM

## 2014-06-15 DIAGNOSIS — Z87898 Personal history of other specified conditions: Secondary | ICD-10-CM

## 2014-06-15 DIAGNOSIS — R0683 Snoring: Secondary | ICD-10-CM

## 2014-06-15 NOTE — Progress Notes (Signed)
Chief Complaint  Patient presents with  . SLEEP CONSULT    Referred by Dr Linna Darner. Epworth Score: 17    History of Present Illness: Katherine Rosario is a 43 y.o. female for evaluation of sleep problems.  She gained about 50 lbs over the past year.  With this she has noticed more trouble with snoring.  She will wake up feeling like she can't catch her breath.  Her partner has told her that she  Stops breathing while asleep.  She will also mumble in her sleep.  She is feeling tired throughout the day, and can fall asleep when sitting quiet.  She goes to sleep between 9 and 10 pm.  She falls asleep after 30 minutes.  She wakes up several times to use the bathroom.  She gets out of bed at 5 am.  She will sleep in until 9 am on weekends, but still feels tired during the day.  She feels tired in the morning.  She denies morning headache.  She does not use anything to help her fall sleep or stay awake.  She denies sleep walking, sleep talking, bruxism, or nightmares.  There is no history of restless legs.  She denies sleep hallucinations, sleep paralysis, or cataplexy.  The Epworth score is 17 out of 24.  Katherine Rosario  has a past medical history of GERD (gastroesophageal reflux disease); Allergic rhinitis; Hyperlipidemia; Asthma; Palpitations; Heart palpitations; Fibroids; and Anemia.  Katherine Rosario  has past surgical history that includes No past surgeries.  Prior to Admission medications   Medication Sig Start Date End Date Taking? Authorizing Provider  albuterol (PROVENTIL HFA;VENTOLIN HFA) 108 (90 BASE) MCG/ACT inhaler Inhale 2 puffs into the lungs every 4 (four) hours as needed for shortness of breath.   Yes Historical Provider, MD  amoxicillin (AMOXIL) 500 MG capsule Take 1 capsule (500 mg total) by mouth 3 (three) times daily. 06/09/14  Yes Hendricks Limes, MD  atenolol (TENORMIN) 50 MG tablet Take 12.5 mg by mouth daily.  05/18/14  Yes Hendricks Limes, MD  calcium-vitamin D (OSCAL WITH D)  500-200 MG-UNIT per tablet Take 1 tablet by mouth daily with breakfast.   Yes Historical Provider, MD  fluticasone (FLONASE) 50 MCG/ACT nasal spray USE 1 SPRAY IN EACH NOSTRILDAILY 02/11/14  Yes Rowe Clack, MD  HYDROcodone-homatropine (HYDROMET) 5-1.5 MG/5ML syrup Take 5 mLs by mouth every 6 (six) hours as needed for cough. 06/09/14  Yes Hendricks Limes, MD  loratadine (CLARITIN) 10 MG tablet Take 10 mg by mouth every morning.   Yes Historical Provider, MD  montelukast (SINGULAIR) 10 MG tablet Take 10 mg by mouth every morning.   Yes Historical Provider, MD  NEOMYCIN-POLYMYXIN-HYDROCORTISONE (CORTISPORIN) 1 % SOLN otic solution Place 3 drops into the left ear 4 (four) times daily. 06/09/14  Yes Hendricks Limes, MD  omeprazole (PRILOSEC) 20 MG capsule Take 20 mg by mouth every morning.   Yes Historical Provider, MD    No Known Allergies  Her family history includes Colon cancer (age of onset: 103) in her father; Hyperlipidemia in her father and mother; Hypertension in her father and mother; Prostate cancer (age of onset: 30) in her father. There is no history of Heart attack or Stroke.  She  reports that she has never smoked. Her smokeless tobacco use includes Snuff. She reports that she drinks alcohol. She reports that she does not use illicit drugs.   Physical Exam:  General - No distress ENT - No sinus tenderness,  no oral exudate, no LAN, no thyromegaly, TM clear, pupils equal/reactive, MP 4, scalloped tongue, narrow nasal angles Cardiac - s1s2 regular, no murmur, pulses symmetric Chest - No wheeze/rales/dullness, good air entry, normal respiratory excursion Back - No focal tenderness Abd - Soft, non-tender, no organomegaly, + bowel sounds Ext - No edema Neuro - Normal strength, cranial nerves intact Skin - No rashes Psych - Normal mood, and behavior  Assessment/plan:  Katherine Rosario, M.D. Pager [phone]

## 2014-06-15 NOTE — Patient Instructions (Signed)
Will arrange for home sleep study Will call to arrange for follow up after sleep study reviewed  

## 2014-06-15 NOTE — Progress Notes (Signed)
   Subjective:    Patient ID: Katherine Rosario, female    DOB: 1971-03-14, 43 y.o.   MRN: [id number]  HPI    Review of Systems  Constitutional: Negative for fever and unexpected weight change.  HENT: Positive for ear pain and sneezing. Negative for congestion, dental problem, nosebleeds, postnasal drip, rhinorrhea, sinus pressure, sore throat and trouble swallowing.   Eyes: Negative for redness and itching.  Respiratory: Positive for cough and shortness of breath. Negative for chest tightness and wheezing.   Cardiovascular: Positive for palpitations and leg swelling.  Gastrointestinal: Negative for nausea and vomiting.  Genitourinary: Negative for dysuria.  Musculoskeletal: Negative for joint swelling.  Skin: Negative for rash.  Neurological: Negative for headaches.  Hematological: Does not bruise/bleed easily.  Psychiatric/Behavioral: Negative for dysphoric mood. The patient is not nervous/anxious.        Objective:   Physical Exam        Assessment & Plan:

## 2014-06-15 NOTE — Assessment & Plan Note (Addendum)
She has snoring, sleep disruption, witnessed apnea, and daytime sleepiness.  Her BMI is > 35.  I am concerned she could have sleep apnea.  We discussed how sleep apnea can affect various health problems including risks for hypertension, cardiovascular disease, and diabetes.  We also discussed how sleep disruption can increase risks for accident, such as while driving.  Weight loss as a means of improving sleep apnea was also reviewed.  Additional treatment options discussed were CPAP therapy, oral appliance, and surgical intervention.  To further assess will arrange for home sleep study pending insurance approval.

## 2014-06-22 ENCOUNTER — Encounter: Payer: Self-pay | Admitting: *Deleted

## 2014-06-23 ENCOUNTER — Encounter: Payer: Self-pay | Admitting: Cardiology

## 2014-06-23 ENCOUNTER — Ambulatory Visit (INDEPENDENT_AMBULATORY_CARE_PROVIDER_SITE_OTHER): Payer: BC Managed Care – PPO | Admitting: Cardiology

## 2014-06-23 VITALS — BP 132/86 | HR 75 | Ht 62.0 in | Wt 203.0 lb

## 2014-06-23 DIAGNOSIS — R55 Syncope and collapse: Secondary | ICD-10-CM

## 2014-06-23 DIAGNOSIS — I951 Orthostatic hypotension: Secondary | ICD-10-CM

## 2014-06-23 DIAGNOSIS — R002 Palpitations: Secondary | ICD-10-CM

## 2014-06-23 DIAGNOSIS — R9431 Abnormal electrocardiogram [ECG] [EKG]: Secondary | ICD-10-CM

## 2014-06-23 NOTE — Patient Instructions (Signed)
Your physician has recommended you make the following change in your medication:   STOP TAKING ATENOLOL NOW   Your physician has recommended that you wear a 48 HOUR holter monitor. Holter monitors are medical devices that record the heart's electrical activity. Doctors most often use these monitors to diagnose arrhythmias. Arrhythmias are problems with the speed or rhythm of the heartbeat. The monitor is a small, portable device. You can wear one while you do your normal daily activities. This is usually used to diagnose what is causing palpitations/syncope (passing out). PER DR NELSON THIS NEEDS TO BE SCHEDULED FOR NEXT WEEK   Your physician recommends that you schedule a follow-up appointment in: [place]

## 2014-06-23 NOTE — Progress Notes (Signed)
Patient ID: Katherine Rosario, female   DOB: Dec 31, 1970, 43 y.o.   MRN: [id number]    Patient Name: Katherine Rosario Date of Encounter: 06/23/2014  Primary Care Provider:  Gwendolyn Grant, MD Primary Cardiologist:  Dorothy Spark  Problem List   Past Medical History  Diagnosis Date  . GERD (gastroesophageal reflux disease)   . Allergic rhinitis   . Hyperlipidemia   . Asthma   . Palpitations   . Heart palpitations   . Fibroids   . Anemia     Heavy menstrual flow/OTC FE BID:     Past Surgical History  Procedure Laterality Date  . No past surgeries     Allergies  No Known Allergies  HPI  43 year old female with h/o palpitations and skipped beats since her 89' when she was started on atenolol. At the times she was bradycardiac and the dose has been decreased from 50 to 12.5 mg po daily. She has no h/o HTN, or DM. She has gained 50 lbs in the last year.  She has seen Dr Linna Darner for 1 episode of near syncope and 1 syncopal episode, both in the morning when getting out of the shower. There was no associated palpitations. On the second occasion she hit her eye.   She has never smoked, no family h/o premature CAD. NO h/o a-fib. Dr Linna Darner was concerned of poor R wave progression in the precordial leads.  She used to be very active, now she doesn't exercise and has sedentary job and is most probably going through menopause.  Home Medications  Prior to Admission medications   Medication Sig Start Date End Date Taking? Authorizing Provider  albuterol (PROVENTIL HFA;VENTOLIN HFA) 108 (90 BASE) MCG/ACT inhaler Inhale 2 puffs into the lungs every 4 (four) hours as needed for shortness of breath.    Historical Provider, MD  amoxicillin (AMOXIL) 500 MG capsule Take 1 capsule (500 mg total) by mouth 3 (three) times daily. 06/09/14   Hendricks Limes, MD  atenolol (TENORMIN) 50 MG tablet Take 12.5 mg by mouth daily.  05/18/14   Hendricks Limes, MD  calcium-vitamin D (OSCAL WITH D) 500-200  MG-UNIT per tablet Take 1 tablet by mouth daily with breakfast.    Historical Provider, MD  fluticasone (FLONASE) 50 MCG/ACT nasal spray USE 1 SPRAY IN EACH NOSTRILDAILY 02/11/14   Rowe Clack, MD  HYDROcodone-homatropine (HYDROMET) 5-1.5 MG/5ML syrup Take 5 mLs by mouth every 6 (six) hours as needed for cough. 06/09/14   Hendricks Limes, MD  loratadine (CLARITIN) 10 MG tablet Take 10 mg by mouth every morning.    Historical Provider, MD  montelukast (SINGULAIR) 10 MG tablet Take 10 mg by mouth every morning.    Historical Provider, MD  NEOMYCIN-POLYMYXIN-HYDROCORTISONE (CORTISPORIN) 1 % SOLN otic solution Place 3 drops into the left ear 4 (four) times daily. 06/09/14   Hendricks Limes, MD  omeprazole (PRILOSEC) 20 MG capsule Take 20 mg by mouth every morning.    Historical Provider, MD    Family History  Family History  Problem Relation Age of Onset  . Hyperlipidemia Mother   . Hypertension Mother   . Colon cancer Father 8  . Hyperlipidemia Father   . Hypertension Father   . Prostate cancer Father 83  . Heart attack Neg Hx   . Stroke Neg Hx     Social History  History   Social History  . Marital Status: Single    Spouse Name: N/A  Number of Children: N/A  . 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 Smoking status: Never Smoker   . Smokeless tobacco: Current User    Types: Snuff  . Alcohol Use: Yes  . Drug Use: No  . Sexual Activity: No   Other Topics Concern  . 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  Cardiovascular:  No chest pain, dyspnea on exertion, edema, orthopnea, palpitations, paroxysmal nocturnal dyspnea. Dermatological: No rash, lesions/masses Respiratory: No cough,  dyspnea Urologic: No hematuria, dysuria Abdominal:   No nausea, vomiting, diarrhea, bright red blood per rectum, melena, or hematemesis Neurologic:  No visual changes, wkns, changes in mental status. All other systems reviewed and are otherwise negative except as noted above.  Physical Exam  There were no vitals taken for this visit.  General: Pleasant, NAD Psych: Normal affect. Neuro: Alert and oriented X 3. Moves all extremities spontaneously. HEENT: Normal  Neck: Supple without bruits or JVD. Lungs:  Resp regular and unlabored, CTA. Heart: RRR no s3, s4, or murmurs. Abdomen: Soft, non-tender, non-distended, BS + x 4.  Extremities: No clubbing, cyanosis, trace lower extremity edema . DP/PT/Radials 2+ and equal bilaterally.  Labs:  No results found for this basename: CKTOTAL, CKMB, TROPONINI,  in the last 72 hours Lab Results  Component Value Date   WBC 8.1 05/01/2014   HGB 14.1 05/01/2014   HCT 42.3 05/01/2014   MCV 93.3 05/01/2014   PLT 372.0 05/01/2014    Lab Results  Component Value Date   DDIMER 0.27 02/09/2014   No components found with this basename: POCBNP,     Component Value Date/Time   NA 139 05/01/2014 1436   K 3.8 05/01/2014 1436   CL 107 05/01/2014 1436   CO2 25 05/01/2014 1436   GLUCOSE 90 05/01/2014 1436   BUN 12 05/01/2014 1436   CREATININE 0.9 05/01/2014 1436   CALCIUM 9.8 05/01/2014 1436   PROT 7.4 05/01/2014 1436   ALBUMIN 3.9 05/01/2014 1436   AST 40* 05/01/2014 1436   ALT 69* 05/01/2014 1436   ALKPHOS 68 05/01/2014 1436   BILITOT 0.4 05/01/2014 1436   GFRNONAA >90 02/07/2013 0800   GFRAA >90 02/07/2013 0800   Lab Results  Component Value Date   CHOL 211* 08/25/2013   HDL 44.70 08/25/2013   LDLCALC 159 07/10/2013   TRIG 219.0* 08/25/2013    Accessory Clinical Findings  echocardiogram  ECG - SR, 76 BPM, low voltage in the precordial leads    Assessment & Plan  43 year old female  1. Recurrent syncope - most probably orthostatic hypotension, we will  stop atenolol  2. Palpitations - stop atenolol, now they are rare, order 48 hour Holter monitor to evaluate for HR during the day and possible arrhythmias  3. Poor R wave progression, lower extremity edema  echocardiogram   4. Hyperlipidemia - LDL 139, TAG 144, HDL 35 - for now just diet and exercise  Follow up in 1 month  Dorothy Spark, MD, [facility] 06/23/2014, 8:22 AM

## 2014-06-30 ENCOUNTER — Telehealth: Payer: Self-pay | Admitting: Pulmonary Disease

## 2014-06-30 NOTE — Telephone Encounter (Signed)
Spoke with pt and advised per [facility] that someone will be contacting her next week to get her scheduled for home sleep study.

## 2014-06-30 NOTE — Telephone Encounter (Signed)
Called patient got VM, left msg to call in ref; home sleep test appt, .Verdie Mosher

## 2014-07-01 ENCOUNTER — Encounter (INDEPENDENT_AMBULATORY_CARE_PROVIDER_SITE_OTHER): Payer: BC Managed Care – PPO

## 2014-07-01 ENCOUNTER — Encounter: Payer: Self-pay | Admitting: *Deleted

## 2014-07-01 DIAGNOSIS — R002 Palpitations: Secondary | ICD-10-CM

## 2014-07-01 DIAGNOSIS — R55 Syncope and collapse: Secondary | ICD-10-CM

## 2014-07-01 DIAGNOSIS — I951 Orthostatic hypotension: Secondary | ICD-10-CM

## 2014-07-01 DIAGNOSIS — R9431 Abnormal electrocardiogram [ECG] [EKG]: Secondary | ICD-10-CM

## 2014-07-01 NOTE — Progress Notes (Signed)
Patient ID: Katherine Rosario, female   DOB: March 31, 1971, 43 y.o.   MRN: [id number] [employer] 48 hour holter monitor applied to patient.

## 2014-07-06 DIAGNOSIS — G4730 Sleep apnea, unspecified: Secondary | ICD-10-CM

## 2014-07-08 ENCOUNTER — Encounter: Payer: Self-pay | Admitting: Pulmonary Disease

## 2014-07-08 ENCOUNTER — Telehealth: Payer: Self-pay | Admitting: Pulmonary Disease

## 2014-07-08 DIAGNOSIS — G4733 Obstructive sleep apnea (adult) (pediatric): Secondary | ICD-10-CM

## 2014-07-08 NOTE — Telephone Encounter (Signed)
HST 07/06/14 >> AHI 11.9, SaO2 low 65%.  Will have my nurse inform pt that she has mild sleep apnea.  Options at this time are 1) arrange for CPAP now and ROV in 2 months, or 2) ROV now.  If pt is agreeable to CPAP set up, then please send order for auto CPAP with pressure range 5 to 15 cm H2O with heated humidity and mask of choice.  Then arrange for download 1 month after CPAP set up and ROV 2 months after CPAP set up.

## 2014-07-09 DIAGNOSIS — G4730 Sleep apnea, unspecified: Secondary | ICD-10-CM

## 2014-07-10 ENCOUNTER — Telehealth: Payer: Self-pay | Admitting: *Deleted

## 2014-07-10 ENCOUNTER — Encounter: Payer: Self-pay | Admitting: Pulmonary Disease

## 2014-07-10 NOTE — Telephone Encounter (Signed)
LMTCB in regards to 24 hour holter monitor results showing frequent PVCs, no therapy necessary, no arrhythmias per Dr Meda Coffee.

## 2014-07-10 NOTE — Telephone Encounter (Signed)
Pt advised and she wants to proceed with cpap. Order placed. Metz Bing, CMA

## 2014-07-10 NOTE — Telephone Encounter (Signed)
LMTCB x 1 

## 2014-07-13 NOTE — Telephone Encounter (Signed)
Pt notified of 24 hour holter monitor results showing frequent PVCs, no arrhythmias, and no therapy needed per Dr Meda Coffee.  Pt verbalized understanding and states she is feeling much better.

## 2014-08-03 ENCOUNTER — Other Ambulatory Visit: Payer: Self-pay | Admitting: Internal Medicine

## 2014-08-26 ENCOUNTER — Ambulatory Visit (INDEPENDENT_AMBULATORY_CARE_PROVIDER_SITE_OTHER): Payer: BC Managed Care – PPO | Admitting: Cardiology

## 2014-08-26 ENCOUNTER — Encounter: Payer: Self-pay | Admitting: Cardiology

## 2014-08-26 VITALS — BP 112/80 | HR 75 | Ht 62.0 in | Wt 207.0 lb

## 2014-08-26 DIAGNOSIS — I493 Ventricular premature depolarization: Secondary | ICD-10-CM

## 2014-08-26 DIAGNOSIS — R55 Syncope and collapse: Secondary | ICD-10-CM

## 2014-08-26 DIAGNOSIS — R9431 Abnormal electrocardiogram [ECG] [EKG]: Secondary | ICD-10-CM

## 2014-08-26 DIAGNOSIS — R002 Palpitations: Secondary | ICD-10-CM

## 2014-08-26 MED ORDER — HYDROCHLOROTHIAZIDE 25 MG PO TABS: 25.0000 mg | ORAL_TABLET | Freq: Every day | ORAL | Status: DC | PRN

## 2014-08-26 NOTE — Progress Notes (Signed)
Patient ID: Katherine Rosario, female   DOB: February 18, 1971, 43 y.o.   MRN: [id number] Patient ID: Katherine Rosario, female   DOB: 1971-08-31, 43 y.o.   MRN: [id number]    Patient Name: Katherine Rosario Date of Encounter: 08/26/2014  Primary Care Provider:  Gwendolyn Grant, MD Primary Cardiologist:  Dorothy Spark  Problem List   Past Medical History  Diagnosis Date  . GERD (gastroesophageal reflux disease)   . Allergic rhinitis   . Hyperlipidemia   . Asthma   . Palpitations   . Heart palpitations   . Fibroids   . Anemia     Heavy menstrual flow/OTC FE BID:    . OSA (obstructive sleep apnea) 05/06/2014   Past Surgical History  Procedure Laterality Date  . No past surgeries     Allergies  No Known Allergies  HPI  43 year old female with h/o palpitations and skipped beats since her 44' when she was started on atenolol. At the times she was bradycardiac and the dose has been decreased from 50 to 12.5 mg po daily. She has no h/o HTN, or DM. She has gained 50 lbs in the last year.  She has seen Dr Linna Darner for 1 episode of near syncope and 1 syncopal episode, both in the morning when getting out of the shower. There was no associated palpitations. On the second occasion she hit her eye.   She has never smoked, no family h/o premature CAD. NO h/o a-fib. Dr Linna Darner was concerned of poor R wave progression in the precordial leads.  She used to be very active, now she doesn't exercise and has sedentary job and is most probably going through menopause.  08/26/2014 - the patient is coming after 2 months, she reports that her palpitations are less frequent and she is much less anxious about it. She is very active and denies any syncope no chest pain or shortness of breath. Her Holter monitor showed only occasional PVCs. Her atenolol was stopped and she is tolerating it very well actually she feels much less fatigue.  Home Medications  Prior to Admission medications   Medication Sig Start Date End  Date Taking? Authorizing Provider  albuterol (PROVENTIL HFA;VENTOLIN HFA) 108 (90 BASE) MCG/ACT inhaler Inhale 2 puffs into the lungs every 4 (four) hours as needed for shortness of breath.    Historical Provider, MD  amoxicillin (AMOXIL) 500 MG capsule Take 1 capsule (500 mg total) by mouth 3 (three) times daily. 06/09/14   Hendricks Limes, MD  atenolol (TENORMIN) 50 MG tablet Take 12.5 mg by mouth daily.  05/18/14   Hendricks Limes, MD  calcium-vitamin D (OSCAL WITH D) 500-200 MG-UNIT per tablet Take 1 tablet by mouth daily with breakfast.    Historical Provider, MD  fluticasone (FLONASE) 50 MCG/ACT nasal spray USE 1 SPRAY IN EACH NOSTRILDAILY 02/11/14   Rowe Clack, MD  HYDROcodone-homatropine (HYDROMET) 5-1.5 MG/5ML syrup Take 5 mLs by mouth every 6 (six) hours as needed for cough. 06/09/14   Hendricks Limes, MD  loratadine (CLARITIN) 10 MG tablet Take 10 mg by mouth every morning.    Historical Provider, MD  montelukast (SINGULAIR) 10 MG tablet Take 10 mg by mouth every morning.    Historical Provider, MD  NEOMYCIN-POLYMYXIN-HYDROCORTISONE (CORTISPORIN) 1 % SOLN otic solution Place 3 drops into the left ear 4 (four) times daily. 06/09/14   Hendricks Limes, MD  omeprazole (PRILOSEC) 20 MG capsule Take 20 mg by mouth every morning.  Historical Provider, MD    Family History  Family History  Problem Relation Age of Onset  . Hyperlipidemia Mother   . Hypertension Mother   . Colon cancer Father 62  . Hyperlipidemia Father   . Hypertension Father   . Prostate cancer Father 79  . Heart attack Neg Hx   . Stroke Neg Hx     Social History  History   Social History  . Marital Status: Single    Spouse Name: N/A    Number of Children: N/A  . 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 Smoking status: Never Smoker   . Smokeless tobacco: Current User    Types: Snuff  . Alcohol Use: Yes  .  Drug Use: No  . Sexual Activity: No   Other Topics Concern  . 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  Cardiovascular:  No chest pain, dyspnea on exertion, edema, orthopnea, palpitations, paroxysmal nocturnal dyspnea. Dermatological: No rash, lesions/masses Respiratory: No cough, dyspnea Urologic: No hematuria, dysuria Abdominal:   No nausea, vomiting, diarrhea, bright red blood per rectum, melena, or hematemesis Neurologic:  No visual changes, wkns, changes in mental status. All other systems reviewed and are otherwise negative except as noted above.  Physical Exam  Blood pressure 112/80, pulse 75, height 5\' 2"  (1.575 m), weight 207 lb (93.895 kg).  General: Pleasant, NAD Psych: Normal affect. Neuro: Alert and oriented X 3. Moves all extremities spontaneously. HEENT: Normal  Neck: Supple without bruits or JVD. Lungs:  Resp regular and unlabored, CTA. Heart: RRR no s3, s4, or murmurs. Abdomen: Soft, non-tender, non-distended, BS + x 4.  Extremities: No clubbing, cyanosis, trace lower extremity edema . DP/PT/Radials 2+ and equal bilaterally.  Labs:  No results for input(s): CKTOTAL, CKMB, TROPONINI in the last 72 hours. Lab Results  Component Value Date   WBC 8.1 05/01/2014   HGB 14.1 05/01/2014   HCT 42.3 05/01/2014   MCV 93.3 05/01/2014   PLT 372.0 05/01/2014    Lab Results  Component Value Date   DDIMER 0.27 02/09/2014   Invalid input(s): POCBNP    Component Value Date/Time   NA 139 05/01/2014 1436   K 3.8 05/01/2014 1436   CL 107 05/01/2014 1436   CO2 25 05/01/2014 1436   GLUCOSE 90 05/01/2014 1436   BUN 12 05/01/2014 1436   CREATININE 0.9 05/01/2014 1436   CALCIUM 9.8 05/01/2014 1436   PROT 7.4 05/01/2014 1436   ALBUMIN 3.9 05/01/2014 1436   AST 40* 05/01/2014 1436    ALT 69* 05/01/2014 1436   ALKPHOS 68 05/01/2014 1436   BILITOT 0.4 05/01/2014 1436   GFRNONAA >90 02/07/2013 0800   GFRAA >90 02/07/2013 0800   Lab Results  Component Value Date   CHOL 211* 08/25/2013   HDL 44.70 08/25/2013   LDLCALC 159 07/10/2013   TRIG 219.0* 08/25/2013    Accessory Clinical Findings  echocardiogram  ECG - SR, 76 BPM, low voltage in the precordial leads    Assessment & Plan  43 year old female  1. Recurrent syncope - most probably orthostatic hypotension, we stopped atenolol she had no recurrent syncope since then.  2. Palpitations - stop atenolol, 48 hour Holter showed only  occasional PVCs. No further management needed for days. \  3. Poor R wave progression, lower extremity edema  echocardiogram   4. Hyperlipidemia - LDL 139, TAG 144, HDL 35 - for now just diet and exercise  Follow up in 1 year  Dorothy Spark, MD, [facility] 08/26/2014, 4:36 PM

## 2014-08-26 NOTE — Patient Instructions (Signed)
Your physician has recommended you make the following change in your medication:    TAKE HYDROCHLOROTHIAZIDE 25 MG ONCE DAILY AS NEEDED FOR LOWER EXTREMITY EDEMA   Your physician has requested that you have an echocardiogram. Echocardiography is a painless test that uses sound waves to create images of your heart. It provides your doctor with information about the size and shape of your heart and how well your heart's chambers and valves are working. This procedure takes approximately one hour. There are no restrictions for this procedure.    Your physician wants you to follow-up in: [place] will receive a reminder letter in the mail two months in advance. If you don't receive a letter, please call our office to schedule the follow-up appointment.

## 2014-09-01 ENCOUNTER — Ambulatory Visit (HOSPITAL_COMMUNITY): Payer: BC Managed Care – PPO | Attending: Cardiology | Admitting: Cardiology

## 2014-09-01 DIAGNOSIS — E669 Obesity, unspecified: Secondary | ICD-10-CM | POA: Diagnosis not present

## 2014-09-01 DIAGNOSIS — E785 Hyperlipidemia, unspecified: Secondary | ICD-10-CM | POA: Insufficient documentation

## 2014-09-01 DIAGNOSIS — R55 Syncope and collapse: Secondary | ICD-10-CM

## 2014-09-01 NOTE — Progress Notes (Signed)
Echo performed. 

## 2014-09-21 ENCOUNTER — Ambulatory Visit (INDEPENDENT_AMBULATORY_CARE_PROVIDER_SITE_OTHER): Payer: BLUE CROSS/BLUE SHIELD | Admitting: Pulmonary Disease

## 2014-09-21 ENCOUNTER — Encounter: Payer: Self-pay | Admitting: Pulmonary Disease

## 2014-09-21 VITALS — BP 130/86 | HR 88 | Ht 62.0 in | Wt 206.6 lb

## 2014-09-21 DIAGNOSIS — G4733 Obstructive sleep apnea (adult) (pediatric): Secondary | ICD-10-CM

## 2014-09-21 NOTE — Progress Notes (Signed)
Chief Complaint  Patient presents with  . Follow-up    CPAP compliance. Wears nightly. Denies problems with mask or pressure.     History of Present Illness: Katherine Rosario is a 44 y.o. female with mild OSA.  She has been sleeping much better.  She is actually able to dream now.  She has nasal pillows >> no problem with mask.  She is more alert during the day, and no longer snoring at night.  TESTS: HST 07/06/14 >> AHI 11.9, SaO2 low 65%. Auto CPAP 07/20/14 to 09/17/14 >> used on 59 of 60 nights with average 7 hrs and 34 min.  Average AHI is 0.4 with median CPAP 6 cm H2O and 95 th percentile CPAP 7 cm H20.  Past medical hx >> GERD, HLD, Asthma, Rhinitis  Past surgical hx, Medications, Allergies, Family hx, Social hx all reviewed.   Physical Exam: Blood pressure 130/86, pulse 88, height 5\' 2"  (1.575 m), weight 206 lb 9.6 oz (93.713 kg), SpO2 96 %. Body mass index is 37.78 kg/(m^2).  General - No distress ENT - No sinus tenderness, no oral exudate, no LAN, MP 4, scalloped tongue, narrow nasal angles Cardiac - s1s2 regular, no murmur Chest - No wheeze/rales/dullness Back - No focal tenderness Abd - Soft, non-tender Ext - No edema Neuro - Normal strength Skin - No rashes Psych - normal mood, and behavior   Assessment/Plan:  Obstructive sleep apnea. She is compliant with therapy and reports benefit from CPAP. Plan: - continue auto CPAP  Obesity. Plan: - discussed options to assist with weight loss   Chesley Mires, MD [facility] Pager:  [phone]

## 2014-09-21 NOTE — Patient Instructions (Signed)
Follow up in 1 year.

## 2014-10-26 ENCOUNTER — Encounter (HOSPITAL_COMMUNITY): Payer: Self-pay

## 2014-10-26 ENCOUNTER — Emergency Department (HOSPITAL_COMMUNITY): Payer: BLUE CROSS/BLUE SHIELD

## 2014-10-26 ENCOUNTER — Emergency Department (HOSPITAL_COMMUNITY)
Admission: EM | Admit: 2014-10-26 | Discharge: 2014-10-26 | Disposition: A | Discharge status: Home / Self Care | Payer: BLUE CROSS/BLUE SHIELD | Attending: Emergency Medicine | Admitting: Emergency Medicine

## 2014-10-26 DIAGNOSIS — Z8742 Personal history of other diseases of the female genital tract: Secondary | ICD-10-CM | POA: Diagnosis not present

## 2014-10-26 DIAGNOSIS — Z7951 Long term (current) use of inhaled steroids: Secondary | ICD-10-CM | POA: Diagnosis not present

## 2014-10-26 DIAGNOSIS — Z79899 Other long term (current) drug therapy: Secondary | ICD-10-CM | POA: Insufficient documentation

## 2014-10-26 DIAGNOSIS — R1031 Right lower quadrant pain: Secondary | ICD-10-CM | POA: Diagnosis present

## 2014-10-26 DIAGNOSIS — N201 Calculus of ureter: Secondary | ICD-10-CM | POA: Insufficient documentation

## 2014-10-26 DIAGNOSIS — Z862 Personal history of diseases of the blood and blood-forming organs and certain disorders involving the immune mechanism: Secondary | ICD-10-CM | POA: Insufficient documentation

## 2014-10-26 DIAGNOSIS — N23 Unspecified renal colic: Secondary | ICD-10-CM

## 2014-10-26 DIAGNOSIS — Z3202 Encounter for pregnancy test, result negative: Secondary | ICD-10-CM | POA: Insufficient documentation

## 2014-10-26 DIAGNOSIS — K219 Gastro-esophageal reflux disease without esophagitis: Secondary | ICD-10-CM | POA: Diagnosis not present

## 2014-10-26 DIAGNOSIS — R109 Unspecified abdominal pain: Secondary | ICD-10-CM

## 2014-10-26 DIAGNOSIS — Z8669 Personal history of other diseases of the nervous system and sense organs: Secondary | ICD-10-CM | POA: Diagnosis not present

## 2014-10-26 DIAGNOSIS — J45909 Unspecified asthma, uncomplicated: Secondary | ICD-10-CM | POA: Diagnosis not present

## 2014-10-26 DIAGNOSIS — Z8639 Personal history of other endocrine, nutritional and metabolic disease: Secondary | ICD-10-CM | POA: Diagnosis not present

## 2014-10-26 LAB — CBC WITH DIFFERENTIAL/PLATELET
BASOS ABS: 0.1 10*3/uL (ref 0.0–0.1)
BASOS PCT: 1 % (ref 0–1)
Eosinophils Absolute: 0.4 10*3/uL (ref 0.0–0.7)
Eosinophils Relative: 4 % (ref 0–5)
HEMATOCRIT: 41 % (ref 36.0–46.0)
Hemoglobin: 13.8 g/dL (ref 12.0–15.0)
LYMPHS ABS: 3.6 10*3/uL (ref 0.7–4.0)
LYMPHS PCT: 36 % (ref 12–46)
MCH: 31 pg (ref 26.0–34.0)
MCHC: 33.7 g/dL (ref 30.0–36.0)
MCV: 92.1 fL (ref 78.0–100.0)
MONOS PCT: 8 % (ref 3–12)
Monocytes Absolute: 0.8 10*3/uL (ref 0.1–1.0)
Neutro Abs: 5.1 10*3/uL (ref 1.7–7.7)
Neutrophils Relative %: 51 % (ref 43–77)
PLATELETS: 324 10*3/uL (ref 150–400)
RBC: 4.45 MIL/uL (ref 3.87–5.11)
RDW: 13.2 % (ref 11.5–15.5)
WBC: 9.9 10*3/uL (ref 4.0–10.5)

## 2014-10-26 LAB — URINE MICROSCOPIC-ADD ON

## 2014-10-26 LAB — URINALYSIS, ROUTINE W REFLEX MICROSCOPIC
Bilirubin Urine: NEGATIVE
Glucose, UA: NEGATIVE mg/dL
KETONES UR: NEGATIVE mg/dL
Leukocytes, UA: NEGATIVE
Nitrite: NEGATIVE
PH: 5.5 (ref 5.0–8.0)
Protein, ur: NEGATIVE mg/dL
SPECIFIC GRAVITY, URINE: 1.019 (ref 1.005–1.030)
UROBILINOGEN UA: 0.2 mg/dL (ref 0.0–1.0)

## 2014-10-26 LAB — PREGNANCY, URINE: Preg Test, Ur: NEGATIVE

## 2014-10-26 LAB — BASIC METABOLIC PANEL
Anion gap: 9 (ref 5–15)
BUN: 12 mg/dL (ref 6–23)
CALCIUM: 8.8 mg/dL (ref 8.4–10.5)
CHLORIDE: 109 mmol/L (ref 96–112)
CO2: 21 mmol/L (ref 19–32)
Creatinine, Ser: 0.85 mg/dL (ref 0.50–1.10)
GFR calc Af Amer: >90 mL/min (ref >90)
GFR calc non Af Amer: 81 mL/min — ABNORMAL LOW (ref >90)
Glucose, Bld: 97 mg/dL (ref 70–99)
POTASSIUM: 3.5 mmol/L (ref 3.5–5.1)
Sodium: 139 mmol/L (ref 135–145)

## 2014-10-26 LAB — POC URINE PREG, ED: Preg Test, Ur: POSITIVE — AB

## 2014-10-26 MED ORDER — MORPHINE SULFATE 4 MG/ML IJ SOLN
4.0000 mg | Freq: Once | INTRAMUSCULAR | Status: AC
  Administered 2014-10-26: 4 mg via INTRAVENOUS
  Filled 2014-10-26: qty 1

## 2014-10-26 MED ORDER — KETOROLAC TROMETHAMINE 30 MG/ML IJ SOLN
30.0000 mg | Freq: Once | INTRAMUSCULAR | Status: AC
  Administered 2014-10-26: 30 mg via INTRAVENOUS
  Filled 2014-10-26: qty 1

## 2014-10-26 MED ORDER — TAMSULOSIN HCL 0.4 MG PO CAPS: 0.4000 mg | ORAL_CAPSULE | Freq: Every day | ORAL | Status: DC

## 2014-10-26 MED ORDER — SODIUM CHLORIDE 0.9 % IV BOLUS (SEPSIS)
1000.0000 mL | Freq: Once | INTRAVENOUS | Status: AC
  Administered 2014-10-26: 1000 mL via INTRAVENOUS

## 2014-10-26 MED ORDER — HYDROCODONE-ACETAMINOPHEN 5-325 MG PO TABS: 1.0000 | ORAL_TABLET | Freq: Four times a day (QID) | ORAL | Status: DC | PRN

## 2014-10-26 MED ORDER — ONDANSETRON 8 MG PO TBDP: 8.0000 mg | ORAL_TABLET | Freq: Three times a day (TID) | ORAL | Status: DC | PRN

## 2014-10-26 NOTE — ED Provider Notes (Addendum)
CSN: [id number]     Arrival date & time 10/26/14  0409 History   First MD Initiated Contact with Patient 10/26/14 [phone]     Chief Complaint  Patient presents with  . Flank Pain     (Consider location/radiation/quality/duration/timing/severity/associated sxs/prior Treatment) HPI Comments: Pt comes in with cc of abdominal pain. Pt has no significant pertinent medical hx. Reports no hx of similar pain. Current pain started around 6 pm, starting the Rt flank region. The pain got worse at 8 pm, improved again, and she was able to sleep. However, pt's pain woke her up in the middle of the night. Associated nausea with emesis. Pt is on her period right now. Pain is also in the RLQ abd. No uti like sx. No hx of renal stones.   ROS 10 Systems reviewed and are negative for acute change except as noted in the HPI.     Patient is a 44 y.o. female presenting with flank pain. The history is provided by the patient.  Flank Pain Associated symptoms include abdominal pain.    Past Medical History  Diagnosis Date  . GERD (gastroesophageal reflux disease)   . Allergic rhinitis   . Hyperlipidemia   . Asthma   . Palpitations   . Heart palpitations   . Fibroids   . Anemia     Heavy menstrual flow/OTC FE BID:    . OSA (obstructive sleep apnea) 05/06/2014   Past Surgical History  Procedure Laterality Date  . No past surgeries     Family History  Problem Relation Age of Onset  . Hyperlipidemia Mother   . Hypertension Mother   . Colon cancer Father 36  . Hyperlipidemia Father   . Hypertension Father   . Prostate cancer Father 37  . Heart attack Neg Hx   . Stroke Neg Hx    History  Substance Use Topics  . Smoking status: Never Smoker   . Smokeless tobacco: Current User    Types: Snuff  . Alcohol Use: Yes   OB History    No data available     Review of Systems  Constitutional: Positive for activity change.  Gastrointestinal: Positive for nausea, vomiting and abdominal pain.   Genitourinary: Positive for flank pain.  All other systems reviewed and are negative.     Allergies  Review of patient's allergies indicates no known allergies.  Home Medications   Prior to Admission medications   Medication Sig Start Date End Date Taking? Authorizing Provider  calcium-vitamin D (OSCAL WITH D) 500-200 MG-UNIT per tablet Take 1 tablet by mouth daily with breakfast.   Yes Historical Provider, MD  loratadine (CLARITIN) 10 MG tablet Take 10 mg by mouth every morning.   Yes Historical Provider, MD  montelukast (SINGULAIR) 10 MG tablet Take 1 tablet (10 mg total) by mouth every morning. 08/03/14  Yes Rowe Clack, MD  omeprazole (PRILOSEC) 20 MG capsule Take 1 capsule (20 mg total) by mouth daily. 08/03/14  Yes Rowe Clack, MD  albuterol (PROVENTIL HFA;VENTOLIN HFA) 108 (90 BASE) MCG/ACT inhaler Inhale 2 puffs into the lungs every 4 (four) hours as needed for shortness of breath.    Historical Provider, MD  fluticasone [facility]) 50 MCG/ACT nasal spray USE 1 SPRAY IN EACH NOSTRILDAILY 02/11/14   Rowe Clack, MD  hydrochlorothiazide (HYDRODIURIL) 25 MG tablet Take 1 tablet (25 mg total) by mouth daily as needed. FOR LOWER EXTREMITY EDEMA 08/26/14   Dorothy Spark, MD   BP 109/69 mmHg  Pulse 68  Temp(Src) 98 F (36.7 C) (Oral)  Resp 18  SpO2 99%  LMP 10/26/2014 Physical Exam  Constitutional: She is oriented to person, place, and time. She appears well-developed and well-nourished.  HENT:  Head: Normocephalic and atraumatic.  Eyes: EOM are normal. Pupils are equal, round, and reactive to light.  Neck: Neck supple.  Cardiovascular: Normal rate, regular rhythm and normal heart sounds.   No murmur heard. Pulmonary/Chest: Effort normal. No respiratory distress.  Abdominal: Soft. She exhibits no distension. There is tenderness. There is no rebound and no guarding.  RLQ tenderness to palpation, including over mcburney's  Neurological: She is alert and  oriented to person, place, and time.  Skin: Skin is warm and dry.  Nursing note and vitals reviewed.   ED Course  Procedures (including critical care time) Labs Review Labs Reviewed  BASIC METABOLIC PANEL - Abnormal; Notable for the following:    GFR calc non Af Amer 81 (*)    All other components within normal limits  URINALYSIS, ROUTINE W REFLEX MICROSCOPIC - Abnormal; Notable for the following:    Hgb urine dipstick LARGE (*)    All other components within normal limits  URINE MICROSCOPIC-ADD ON - Abnormal; Notable for the following:    Squamous Epithelial / LPF FEW (*)    All other components within normal limits  POC URINE PREG, ED - Abnormal; Notable for the following:    Preg Test, Ur POSITIVE (*)    All other components within normal limits  CBC WITH DIFFERENTIAL/PLATELET  PREGNANCY, URINE    Imaging Review No results found.   EKG Interpretation None     @6 :15 UA shows + Hb in the urine. Repeat abd exam - suprapubic pain and RLQ pain, tenderness worst over the suprapubic region. CT renal stone ordered, suspicion low for appendicitis now.  MDM   Final diagnoses:  Abdominal pain  Right flank pain  Right lower quadrant pain    Pt comes in with cc of abd pain. Right flank pain, and RLQ pain. On exam she has RLQ tenderness. PT on period. Initial impression is, if the upreg is neg, that pt has either a renal colic or appendicitis - and with the improvement and then worsening of the pain - it is more likely. Although, retroverted appendic can cause abd pain and flank pain.  Will get a quick UA and decide on the imaging.  Varney Biles, MD 10/26/14 [place], MD 10/26/14 [phone]

## 2014-10-26 NOTE — ED Notes (Signed)
Pt presents with c/o right side flank pain that started around 8 pm last night. Pt reports N/V, denies diarrhea. Pt denies any personal hx of kidney stones.

## 2014-10-26 NOTE — ED Notes (Signed)
Patient's POC urine preg test was negative.

## 2014-10-26 NOTE — ED Notes (Signed)
Pt reported having rt flank pain and RLQ pain with n/v x4-6 in 24hrs. Pt denies hematuria but has urinary urgency/frequency.

## 2014-10-26 NOTE — Discharge Instructions (Signed)
We saw you in the ER for the abdominal pain. Our results indicate that you have a kidney stone. We were able to get your pain is relative control, and we can safely send you home.  Take the meds prescribed. Set up an appointment with the Urologist. If the pain is unbearable, you start having fevers, chills, and are unable to keep any meds down - then return to the ER.   Ureteral Colic (Kidney Stones) Ureteral colic is the result of a condition when kidney stones form inside the kidney. Once kidney stones are formed they may move into the tube that connects the kidney with the bladder (ureter). If this occurs, this condition may cause pain (colic) in the ureter.  CAUSES  Pain is caused by stone movement in the ureter and the obstruction caused by the stone. SYMPTOMS  The pain comes and goes as the ureter contracts around the stone. The pain is usually intense, sharp, and stabbing in character. The location of the pain may move as the stone moves through the ureter. When the stone is near the kidney the pain is usually located in the back and radiates to the belly (abdomen). When the stone is ready to pass into the bladder the pain is often located in the lower abdomen on the side the stone is located. At this location, the symptoms may mimic those of a urinary tract infection with urinary frequency. Once the stone is located here it often passes into the bladder and the pain disappears completely. TREATMENT   Your caregiver will provide you with medicine for pain relief.  You may require specialized follow-up X-rays.  The absence of pain does not always mean that the stone has passed. It may have just stopped moving. If the urine remains completely obstructed, it can cause loss of kidney function or even complete destruction of the involved kidney. It is your responsibility and in your interest that X-rays and follow-ups as suggested by your caregiver are completed. Relief of pain without passage  of the stone can be associated with severe damage to the kidney, including loss of kidney function on that side.  If your stone does not pass on its own, additional measures may be taken by your caregiver to ensure its removal. HOME CARE INSTRUCTIONS   Increase your fluid intake. Water is the preferred fluid since juices containing vitamin C may acidify the urine making it less likely for certain stones (uric acid stones) to pass.  Strain all urine. A strainer will be provided. Keep all particulate matter or stones for your caregiver to inspect.  Take your pain medicine as directed.  Make a follow-up appointment with your caregiver as directed.  Remember that the goal is passage of your stone. The absence of pain does not mean the stone is gone. Follow your caregiver's instructions.  Only take over-the-counter or prescription medicines for pain, discomfort, or fever as directed by your caregiver. SEEK MEDICAL CARE IF:   Pain cannot be controlled with the prescribed medicine.  You have a fever.  Pain continues for longer than your caregiver advises it should.  There is a change in the pain, and you develop chest discomfort or constant abdominal pain.  You feel faint or pass out. MAKE SURE YOU:   Understand these instructions.  Will watch your condition.  Will get help right away if you are not doing well or get worse. Document Released: 05/31/2005 Document Revised: 12/16/2012 Document Reviewed: 02/15/2011 [facility] Patient Information 2015 [place], [place].  This information is not intended to replace advice given to you by your health care provider. Make sure you discuss any questions you have with your health care provider. ° °

## 2014-10-26 NOTE — ED Notes (Signed)
Pt to CT scan and returned without distress noted. 

## 2014-11-23 ENCOUNTER — Other Ambulatory Visit: Payer: Self-pay

## 2014-11-23 DIAGNOSIS — Z1231 Encounter for screening mammogram for malignant neoplasm of breast: Secondary | ICD-10-CM

## 2014-11-30 ENCOUNTER — Ambulatory Visit
Admission: RE | Admit: 2014-11-30 | Discharge: 2014-11-30 | Disposition: A | Discharge status: Home / Self Care | Payer: BLUE CROSS/BLUE SHIELD | Source: Ambulatory Visit

## 2014-11-30 DIAGNOSIS — Z1231 Encounter for screening mammogram for malignant neoplasm of breast: Secondary | ICD-10-CM

## 2015-01-20 ENCOUNTER — Ambulatory Visit (INDEPENDENT_AMBULATORY_CARE_PROVIDER_SITE_OTHER): Payer: BLUE CROSS/BLUE SHIELD | Admitting: Adult Health

## 2015-01-20 ENCOUNTER — Encounter: Payer: Self-pay | Admitting: Adult Health

## 2015-01-20 VITALS — BP 120/88 | Temp 98.8°F | Wt 191.0 lb

## 2015-01-20 DIAGNOSIS — G43009 Migraine without aura, not intractable, without status migrainosus: Secondary | ICD-10-CM

## 2015-01-20 MED ORDER — SUMATRIPTAN SUCCINATE 50 MG PO TABS: 50.0000 mg | ORAL_TABLET | ORAL | Status: DC | PRN

## 2015-01-20 MED ORDER — ONDANSETRON 4 MG PO TBDP
4.0000 mg | ORAL_TABLET | Freq: Three times a day (TID) | ORAL | Status: DC | PRN
Start: 2015-01-20 — End: 2017-11-14

## 2015-01-20 MED ORDER — KETOROLAC TROMETHAMINE 60 MG/2ML IM SOLN
60.0000 mg | Freq: Once | INTRAMUSCULAR | Status: AC
  Administered 2015-01-20: 60 mg via INTRAMUSCULAR

## 2015-01-20 NOTE — Patient Instructions (Addendum)
I have sent two prescriptions to the pharmacy. One is Imitrex and the other is Zofran. Please take the imitrex as prescribed. If it does not help, please let me know. Use the zofran every 8 hours as needed to nausea. You can also take benadryl when you get home.   Get rest and drink plenty of fluids. Let me know if you need anything.    Migraine Headache A migraine headache is very bad, throbbing pain on one or both sides of your head. Talk to your doctor about what things may bring on (trigger) your migraine headaches. HOME CARE  Only take medicines as told by your doctor.  Lie down in a dark, quiet room when you have a migraine.  Keep a journal to find out if certain things bring on migraine headaches. For example, write down:  What you eat and drink.  How much sleep you get.  Any change to your diet or medicines.  Lessen how much alcohol you drink.  Quit smoking if you smoke.  Get enough sleep.  Lessen any stress in your life.  Keep lights dim if bright lights bother you or make your migraines worse. GET HELP RIGHT AWAY IF:   Your migraine becomes really bad.  You have a fever.  You have a stiff neck.  You have trouble seeing.  Your muscles are weak, or you lose muscle control.  You lose your balance or have trouble walking.  You feel like you will pass out (faint), or you pass out.  You have really bad symptoms that are different than your first symptoms. MAKE SURE YOU:   Understand these instructions.  Will watch your condition.  Will get help right away if you are not doing well or get worse. Document Released: 05/30/2008 Document Revised: 11/13/2011 Document Reviewed: 04/28/2013 [facility] Patient Information 2015 [place], [place]. This information is not intended to replace advice given to you by your health care provider. Make sure you discuss any questions you have with your health care provider.

## 2015-01-20 NOTE — Progress Notes (Signed)
Pre visit review using our clinic review tool, if applicable. No additional management support is needed unless otherwise documented below in the visit note. 

## 2015-01-20 NOTE — Progress Notes (Signed)
Subjective:    Patient ID: Katherine Rosario, female    DOB: [birth year], 44 y.o.   MRN: [id number]  HPI  Patient presents to the office today for headache. Pain is right side behind eye, right temple and down the right side of the  Neck. Her previous headache was two weeks ago and had the same symptoms. States that the headaches are getting more frequent.   Has tried Tylenol and caffeine without relief.   + photo phobia, nausea  No vomiting, no blurred vision, no tearing of the eye.   Review of Systems  Constitutional: Positive for activity change, appetite change and fatigue. Negative for fever, chills, diaphoresis and unexpected weight change.  HENT: Negative for postnasal drip, sinus pressure and sore throat.   Eyes: Positive for photophobia and pain. Negative for redness, itching and visual disturbance.  Neurological: Positive for headaches. Negative for dizziness, speech difficulty, weakness and light-headedness.  All other systems reviewed and are negative.  Past Medical History  Diagnosis Date  . GERD (gastroesophageal reflux disease)   . Allergic rhinitis   . Hyperlipidemia   . Asthma   . Palpitations   . Heart palpitations   . Fibroids   . Anemia     Heavy menstrual flow/OTC FE BID:    . OSA (obstructive sleep apnea) 05/06/2014    History   Social History  . Marital Status: Married    Spouse Name: N/A  . Number of Children: N/A  . 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 Smoking status: Never Smoker   . Smokeless tobacco: Current User    Types: Snuff  . Alcohol Use: Yes  . Drug Use: No  . Sexual Activity: No   Other Topics Concern  . 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 No past surgeries      Family History  Problem Relation Age of  Onset  . Hyperlipidemia Mother   . Hypertension Mother   . Colon cancer Father 34  . Hyperlipidemia Father   . Hypertension Father   . Prostate cancer Father 23  . Heart attack Neg Hx   . Stroke Neg Hx     No Known Allergies  Current Outpatient Prescriptions on File Prior to Visit  Medication Sig Dispense Refill  . albuterol (PROVENTIL HFA;VENTOLIN HFA) 108 (90 BASE) MCG/ACT inhaler Inhale 2 puffs into the lungs every 4 (four) hours as needed for shortness of breath.    . calcium-vitamin D (OSCAL WITH D) 500-200 MG-UNIT per tablet Take 1 tablet by mouth daily with breakfast.    . fluticasone (FLONASE) 50 MCG/ACT nasal spray USE 1 SPRAY IN EACH NOSTRILDAILY 16 g 2  . hydrochlorothiazide (HYDRODIURIL) 25 MG tablet Take 1 tablet (25 mg total) by mouth daily as needed. FOR LOWER EXTREMITY EDEMA 90 tablet 3  . loratadine (CLARITIN) 10 MG tablet Take 10 mg by mouth every morning.    . montelukast (SINGULAIR) 10 MG tablet Take 1 tablet (10 mg total) by mouth every morning. 90 tablet 2  . omeprazole (PRILOSEC) 20 MG capsule Take 1 capsule (20 mg total) by mouth daily. 90 capsule 2   No current facility-administered medications on file prior to visit.    BP 120/88 mmHg  Temp(Src) 98.8 F (37.1 C) (Oral)  Wt 191 lb (86.637 kg)       Objective:   Physical Exam  Constitutional: She is oriented to person, place, and time. She appears well-developed and well-nourished. No distress.  Tired appearing  HENT:  Head: Normocephalic and atraumatic.  Right Ear: External ear normal.  Left Ear: External ear normal.  Nose: Nose normal.  Mouth/Throat: Oropharynx is clear and moist.  Scar tissue in left ear  Eyes: Conjunctivae and EOM are normal. Pupils are equal, round, and reactive to light. Right eye exhibits no discharge. Left eye exhibits no discharge.  Musculoskeletal: Normal range of motion. She exhibits no edema or tenderness.  Lymphadenopathy:    She has no cervical adenopathy.    Neurological: She is alert and oriented to person, place, and time. She has normal reflexes.  Pain with palpation to right forehead, and right temple area.   Skin: Skin is warm and dry. She is not diaphoretic.  Psychiatric: She has a normal mood and affect. Her behavior is normal. Judgment and thought content normal.  Nursing note and vitals reviewed.       Assessment & Plan:  1. Migraine without aura and without status migrainosus, not intractable - ondansetron (ZOFRAN ODT) 4 MG disintegrating tablet; Take 1 tablet (4 mg total) by mouth every 8 (eight) hours as needed for nausea or vomiting.  Dispense: 20 tablet; Refill: 2 - SUMAtriptan (IMITREX) 50 MG tablet; Take 1 tablet (50 mg total) by mouth every 2 (two) hours as needed for migraine. May repeat in 2 hours if headache persists or recurs.  Dispense: 10 tablet; Refill: 1 - ketorolac (TORADOL) injection 60 mg; Inject 2 mLs (60 mg total) into the muscle once. - Follow up if no improvement in 24-48 hours.  - Follow up sooner if symptoms become worse.

## 2015-04-20 ENCOUNTER — Telehealth: Payer: Self-pay

## 2015-04-20 NOTE — Telephone Encounter (Signed)
appt made

## 2015-04-20 NOTE — Telephone Encounter (Signed)
lvm for pt to call back.   RE: pt needs an appt for FMLA paperwork to be filled out.

## 2015-04-27 ENCOUNTER — Ambulatory Visit (INDEPENDENT_AMBULATORY_CARE_PROVIDER_SITE_OTHER): Payer: BLUE CROSS/BLUE SHIELD | Admitting: Internal Medicine

## 2015-04-27 ENCOUNTER — Encounter: Payer: Self-pay | Admitting: Internal Medicine

## 2015-04-27 ENCOUNTER — Other Ambulatory Visit (INDEPENDENT_AMBULATORY_CARE_PROVIDER_SITE_OTHER): Payer: BLUE CROSS/BLUE SHIELD

## 2015-04-27 ENCOUNTER — Encounter: Payer: Self-pay | Admitting: Emergency Medicine

## 2015-04-27 VITALS — BP 126/84 | HR 83 | Temp 98.4°F | Resp 16 | Wt 192.0 lb

## 2015-04-27 DIAGNOSIS — G43C0 Periodic headache syndromes in child or adult, not intractable: Secondary | ICD-10-CM

## 2015-04-27 DIAGNOSIS — G43909 Migraine, unspecified, not intractable, without status migrainosus: Secondary | ICD-10-CM | POA: Insufficient documentation

## 2015-04-27 LAB — SEDIMENTATION RATE: SED RATE: 20 mm/h (ref 0–22)

## 2015-04-27 MED ORDER — TOPIRAMATE 25 MG PO TABS: 25.0000 mg | ORAL_TABLET | Freq: Two times a day (BID) | ORAL | Status: DC

## 2015-04-27 MED ORDER — SUMATRIPTAN SUCCINATE 100 MG PO TABS: ORAL_TABLET | ORAL | Status: DC

## 2015-04-27 NOTE — Patient Instructions (Signed)
Please keep a diary of your headaches . Document  each occurrence on the calendar with notation of : #1 any prodrome ( any non headache symptom such as marked fatigue,visual changes, ,etc ) which precedes actual headache (Excedrin migraine taken with a prodrome may prevent the headache) ; #2) severity on 1-10 scale; #3) any triggers ( food/ drink,enviromenntal or weather changes ,physical or emotional stress) in 8-12 hour period prior to the headache; & #4) response to any medications or other intervention. Please review "Headache" @ [facility] for additional information.    Chronic daily headaches represent  a conversion of migraines into a headache which recurs repeatedly every day .These are almost always  due to the use of excess nonsteroidals such as ibuprofen or naproxen and or caffeine. The nonsteroidals and caffeine should be weaned as quickly as possible; but they should not be "cold [place]" going from a high dose to none.

## 2015-04-27 NOTE — Progress Notes (Signed)
   Subjective:    Patient ID: Katherine Rosario, female    DOB: 10/21/1970, 44 y.o.   MRN: [id number]  HPI   She describes migraines intermittently since 1993 . In the last year they've increased in frequency up to 4-5 times per month. The headaches are described as sharp and throbbing, localized around the right eye and in the right temple.  The headaches can last 2 days. There is no definite trigger noted. She does have somewhat of a prodrome with blurred vision of the right eye. With headache she'll experience nausea, vomiting, & weakness. There is no aura with the actual headache.  These have necessitated using 3-4 weeks of vacation time due to recurrences.Her HR suggested an evaluation for FMLA.  Low dose Sumatriptan has been of only partial relief. She is now trying Chiropractry to treat the headaches.  In the last couple months she's been taking Advil 2 twice a day. She has 2 cups of coffee daily. She is not on hormonal replacement therapy  She has not been evaluated by a Neurologist.    Review of Systems No fever, chills, significant weight change, fatigue, weakness or night sweats. No double vision or loss of vision No  vertigo, limb weakness, tremor, gait disturbance, seizures, memory loss, numbness or tingling     Objective:   Physical Exam Pertinent or positive findings include:Neurologic exam : Cn 2-7 intact Strength equal & normal in upper & lower extremities Able to walk on heels and toes.   Balance normal  Romberg normal, finger to nose normal. Deep tendon reflexes are normal. There is punctate irregular hemorrhage over the left tympanic membrane. There is minor crepitus of the knees.  General appearance :adequately nourished; in no distress.BMI 35.11.  Eyes: No conjunctival inflammation or scleral icterus is present.EOM & FOV intact.  Oral exam:  Lips and gums are healthy appearing.There is no oropharyngeal erythema or exudate noted. Dental hygiene is  good.  Heart:  Normal rate and regular rhythm. S1 and S2 normal without gallop, murmur, click, rub or other extra sounds    Lungs:Chest clear to auscultation; no wheezes, rhonchi,rales ,or rubs present.No increased work of breathing.   Abdomen: bowel sounds normal, soft and non-tender without masses, organomegaly or hernias noted.  No guarding or rebound.   Vascular : all pulses equal ; no bruits present.  Skin:Warm & dry.  Intact without suspicious lesions or rashes ; no tenting or jaundice   Lymphatic: No lymphadenopathy is noted about the head, neck, axilla.   Neuro: Strength, tone  normal.          Assessment & Plan:  #1 migraine headaches with increasing frequency. This is in the context of non-steroidal and caffeine ingestion.Treatment to date as low dose,prn tryptan  Plan: See orders and recommendations.Sed rate because of R temporal involvement. If symptoms persist despite planned interventions; Neurology evaluation indicated.

## 2015-04-27 NOTE — Progress Notes (Signed)
Pre visit review using our clinic review tool, if applicable. No additional management support is needed unless otherwise documented below in the visit note. 

## 2015-05-27 ENCOUNTER — Other Ambulatory Visit: Payer: Self-pay | Admitting: Internal Medicine

## 2015-05-27 DIAGNOSIS — G43C0 Periodic headache syndromes in child or adult, not intractable: Secondary | ICD-10-CM

## 2015-06-19 ENCOUNTER — Other Ambulatory Visit: Payer: Self-pay | Admitting: Internal Medicine

## 2015-06-21 ENCOUNTER — Other Ambulatory Visit: Payer: Self-pay | Admitting: Internal Medicine

## 2015-06-21 ENCOUNTER — Telehealth: Payer: Self-pay | Admitting: Internal Medicine

## 2015-06-21 MED ORDER — MONTELUKAST SODIUM 10 MG PO TABS: 10.0000 mg | ORAL_TABLET | Freq: Every morning | ORAL | Status: DC

## 2015-06-21 MED ORDER — OMEPRAZOLE 20 MG PO CPDR: 20.0000 mg | DELAYED_RELEASE_CAPSULE | Freq: Every day | ORAL | Status: DC

## 2015-06-21 NOTE — Telephone Encounter (Signed)
Please advise 

## 2015-06-21 NOTE — Telephone Encounter (Signed)
Pt called in and needs new script for Prilosec and Singulair   [facility]

## 2015-06-21 NOTE — Telephone Encounter (Signed)
Please verify dose with her If headaches are not completely controlled; Topamax dose will need to be increased by 1 pill @ one f 2 daily doses above present dose

## 2015-06-21 NOTE — Telephone Encounter (Signed)
erx done

## 2015-06-23 ENCOUNTER — Telehealth: Payer: Self-pay | Admitting: Emergency Medicine

## 2015-06-23 DIAGNOSIS — G43C0 Periodic headache syndromes in child or adult, not intractable: Secondary | ICD-10-CM

## 2015-06-23 MED ORDER — TOPIRAMATE 25 MG PO TABS: 25.0000 mg | ORAL_TABLET | Freq: Two times a day (BID) | ORAL | Status: DC

## 2015-06-23 NOTE — Telephone Encounter (Signed)
Spoke with pt and verified that Topamax was helping with migraines. Pt stated she was taking two daily. RX has been sent to [facility]

## 2015-07-12 ENCOUNTER — Encounter: Payer: Self-pay | Admitting: Neurology

## 2015-07-12 ENCOUNTER — Ambulatory Visit (INDEPENDENT_AMBULATORY_CARE_PROVIDER_SITE_OTHER): Payer: BLUE CROSS/BLUE SHIELD | Admitting: Neurology

## 2015-07-12 ENCOUNTER — Ambulatory Visit: Payer: BLUE CROSS/BLUE SHIELD | Admitting: Neurology

## 2015-07-12 VITALS — BP 114/62 | HR 83 | Ht 62.0 in | Wt 196.4 lb

## 2015-07-12 DIAGNOSIS — G43109 Migraine with aura, not intractable, without status migrainosus: Secondary | ICD-10-CM

## 2015-07-12 DIAGNOSIS — R51 Headache: Secondary | ICD-10-CM

## 2015-07-12 DIAGNOSIS — R519 Headache, unspecified: Secondary | ICD-10-CM

## 2015-07-12 MED ORDER — NAPROXEN SODIUM 550 MG PO TABS: 550.0000 mg | ORAL_TABLET | Freq: Two times a day (BID) | ORAL | Status: DC | PRN

## 2015-07-12 NOTE — Progress Notes (Signed)
MRI brain w/o contrast scheduled for Thursday 11/17 @ 1 p.m. @ [facility].  VM left for pt.

## 2015-07-12 NOTE — Patient Instructions (Signed)
Migraine Recommendations: 1.  Continue topiramate 25mg  twice daily.  2.  Take sumatriptan 100mg  with naproxen 550mg  at earliest onset of headache.  May repeat dose of sumatriptan once in 2 hours if needed (you cannot repeat naproxen for another 12 hours).  Do not exceed two tablets of sumatriptan in 24 hours. 3.  Limit use of pain relievers to no more than 2 days out of the week.  These medications include acetaminophen, ibuprofen, triptans and narcotics.  This will help reduce risk of rebound headaches. 4.  Be aware of common food triggers such as processed sweets, processed foods with nitrites (such as deli meat, hot dogs, sausages), foods with MSG, alcohol (such as wine), chocolate, certain cheeses, certain fruits (dried fruits, some citrus fruit), vinegar, diet soda. 4.  Avoid caffeine 5.  Routine exercise 6.  Proper sleep hygiene 7.  Stay adequately hydrated with water 8.  Keep a headache diary. 9.  Maintain proper stress management. 10.  Do not skip meals. 11.  Consider supplements:  Magnesium oxide 400mg  to 600mg  daily, riboflavin 400mg , Coenzyme Q 10 100mg  three times daily 12.  Since these are new frequent headaches, we will get MRI of the brain without contrast 13.  Follow up in 3 months.

## 2015-07-12 NOTE — Progress Notes (Signed)
NEUROLOGY CONSULTATION NOTE  Katherine Rosario MRN: [id number] DOB: 06/16/1971  Referring provider: Dr. Linna Darner Primary care provider: Dr. Asa Lente  Reason for consult:  headache  HISTORY OF PRESENT ILLNESS: Katherine Rosario is a 44 year old right-handed female with hypertension, asthma and mild OSA who presents for migraines.  History obtained by patient and Dr. Clayborn Heron note.  Labs reviewed.  Onset:  Over the past few months.  She does report remote history of migraines as a teenager but cannot remember if they were different. Location:  Right temporal Quality:  throbbing Intensity:  8/10 Aura:  Fuzzy vision in right eye precedes onset of headache Prodrome:  lethargy Associated symptoms:  Nausea, photophobia, phonophobia, sometimes vomiting.  On one occasion, she had vertigo. Duration:  2 to 3 days.  She took sumatriptan once and it lasted 2 hours. Frequency:  2 to 3 times a month (6-9 days total) Triggers/exacerbating factors:  Maybe second hand cigarette smoke Relieving factors:  Essential oils Activity:  Misses work   Sed Rate was 20.  Past abortive medication:  Excedrin, ibuprofen, Tylenol Past preventative medication:  none Other past therapy:  none  Current abortive medication:  Sumatriptan 100mg  (only used once so far) Antihypertensive medications:  HCTZ Antidepressant medications:  none Anticonvulsant medications:  topiramate 25mg  twice daily (started a month ago) Other therapy:  Chiropractic medicine  Caffeine:  1 to 2 cups of coffee daily Alcohol:  occasionally Smoker:  No (spouse smokes) Diet:  Healthy, increased water intake Exercise:  no Depression/stress:  Financial stress as spouse was laid off a few month ago Sleep hygiene:  Good since started CPAP Family history of headache:  mother  PAST MEDICAL HISTORY: Past Medical History  Diagnosis Date  . GERD (gastroesophageal reflux disease)   . Allergic rhinitis   . Hyperlipidemia   . Asthma   .  Palpitations   . Heart palpitations   . Fibroids   . Anemia     Heavy menstrual flow/OTC FE BID:    . OSA (obstructive sleep apnea) 05/06/2014    PAST SURGICAL HISTORY: Past Surgical History  Procedure Laterality Date  . No past surgeries      MEDICATIONS: Current Outpatient Prescriptions on File Prior to Visit  Medication Sig Dispense Refill  . albuterol (PROVENTIL HFA;VENTOLIN HFA) 108 (90 BASE) MCG/ACT inhaler Inhale 2 puffs into the lungs every 4 (four) hours as needed for shortness of breath.    . calcium-vitamin D (OSCAL WITH D) 500-200 MG-UNIT per tablet Take 1 tablet by mouth daily with breakfast.    . fluticasone (FLONASE) 50 MCG/ACT nasal spray USE 1 SPRAY IN EACH NOSTRILDAILY 16 g 2  . hydrochlorothiazide (HYDRODIURIL) 25 MG tablet Take 1 tablet (25 mg total) by mouth daily as needed. FOR LOWER EXTREMITY EDEMA 90 tablet 3  . loratadine (CLARITIN) 10 MG tablet Take 10 mg by mouth every morning.    . montelukast (SINGULAIR) 10 MG tablet Take 1 tablet (10 mg total) by mouth every morning. 90 tablet 3  . ondansetron (ZOFRAN ODT) 4 MG disintegrating tablet Take 1 tablet (4 mg total) by mouth every 8 (eight) hours as needed for nausea or vomiting. 20 tablet 2  . SUMAtriptan (IMITREX) 100 MG tablet I @ ONSET of migraine;repeat in 2 hours if needed 10 tablet 1  . topiramate (TOPAMAX) 25 MG tablet Take 1 tablet (25 mg total) by mouth 2 (two) times daily. 180 tablet 1   No current facility-administered medications on file prior to visit.  ALLERGIES: No Known Allergies  FAMILY HISTORY: Family History  Problem Relation Age of Onset  . Hyperlipidemia Mother   . Hypertension Mother   . Colon cancer Father 73  . Hyperlipidemia Father   . Hypertension Father   . Prostate cancer Father 38  . Heart attack Neg Hx   . Stroke Neg Hx     SOCIAL HISTORY: Social History   Social History  . Marital Status: Married    Spouse Name: N/A  . Number of Children: N/A  . 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 Smoking status: Never Smoker   . Smokeless tobacco: Current User    Types: Snuff  . Alcohol Use: Yes  . Drug Use: No  . Sexual Activity: No   Other Topics Concern  . 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      Pt lives with wife, in a one story home. Has some college education.     REVIEW OF SYSTEMS: Constitutional: No fevers, chills, or sweats, no generalized fatigue, change in appetite Eyes: No visual changes, double vision, eye pain Ear, nose and throat: No hearing loss, ear pain, nasal congestion, sore throat Cardiovascular: No chest pain, palpitations Respiratory:  No shortness of breath at rest or with exertion, wheezes GastrointestinaI: No nausea, vomiting, diarrhea, abdominal pain, fecal incontinence Genitourinary:  No dysuria, urinary retention or frequency Musculoskeletal:  No neck pain, back pain Integumentary: No rash, pruritus, skin lesions Neurological: as above Psychiatric: No depression, insomnia, anxiety Endocrine: No palpitations, fatigue, diaphoresis, mood swings, change in appetite, change in weight, increased thirst Hematologic/Lymphatic:  No anemia, purpura, petechiae. Allergic/Immunologic: no itchy/runny eyes, nasal congestion, recent allergic reactions, rashes  PHYSICAL EXAM: Filed Vitals:   07/12/15 0841  BP: 114/62  Pulse: 83   General: No acute distress.  Patient appears well-groomed.  Head:  Normocephalic/atraumatic Eyes:  fundi unremarkable, without vessel changes, exudates, hemorrhages or papilledema. Neck: supple, no paraspinal tenderness, full range of motion Back: No paraspinal tenderness Heart: regular rate and rhythm Lungs: Clear to auscultation bilaterally. Vascular: No carotid bruits. Neurological Exam: Mental status: alert and oriented  to person, place, and time, recent and remote memory intact, fund of knowledge intact, attention and concentration intact, speech fluent and not dysarthric, language intact. Cranial nerves: CN I: not tested CN II: pupils equal, round and reactive to light, visual fields intact, fundi unremarkable, without vessel changes, exudates, hemorrhages or papilledema. CN III, IV, VI:  full range of motion, no nystagmus, no ptosis CN V: facial sensation intact CN VII: upper and lower face symmetric CN VIII: hearing intact CN IX, X: gag intact, uvula midline CN XI: sternocleidomastoid and trapezius muscles intact CN XII: tongue midline Bulk & Tone: normal, no fasciculations. Motor:  5/5 throughout Sensation: temperature and vibration sensation intact. Deep Tendon Reflexes:  2+ throughout, toes downgoing. Finger to nose testing:  Without dysmetria.  Heel to shin:  Without dysmetria.  Gait:  Normal station and stride.  Able to turn and tandem walk. Romberg negative.  IMPRESSION: New onset headache, probably migraine with visual aura.  PLAN: 1.  Continue topiramate 25mg  twice daily 2.  For abortive therapy will continue sumatriptan 100mg .  First dose will be taken with 550mg  naproxen 3.  Since these are new onset frequent headaches, we will get MRI of brain without contrast to rule  out secondary causes 4.  Follow up in 3 months.  Thank you for allowing me to take part in the care of this patient.  Metta Clines, DO  CC:  Gwendolyn Grant, MD  Unice Cobble, MD

## 2015-07-22 ENCOUNTER — Other Ambulatory Visit: Payer: BLUE CROSS/BLUE SHIELD

## 2015-07-22 ENCOUNTER — Ambulatory Visit
Admission: RE | Admit: 2015-07-22 | Discharge: 2015-07-22 | Disposition: A | Discharge status: Home / Self Care | Payer: BLUE CROSS/BLUE SHIELD | Source: Ambulatory Visit | Attending: Neurology | Admitting: Neurology

## 2015-07-22 DIAGNOSIS — R519 Headache, unspecified: Secondary | ICD-10-CM

## 2015-07-22 DIAGNOSIS — R51 Headache: Principal | ICD-10-CM

## 2015-07-23 ENCOUNTER — Telehealth: Payer: Self-pay

## 2015-07-23 NOTE — Telephone Encounter (Signed)
-----   Message from Pieter Partridge, DO sent at 07/23/2015  7:31 AM EST ----- MRI of the brain is normal.  Incidentally, it shows collection of fluid behind the left ear, which have have indicated a prior ear infection.  If she is experiencing any fever, left ear pain or left ear drainage, she should speak with her PCP

## 2015-07-23 NOTE — Telephone Encounter (Signed)
Message relayed to patient. Verbalized understanding and denied questions.   

## 2015-09-01 ENCOUNTER — Encounter: Payer: Self-pay | Admitting: Cardiology

## 2015-09-01 ENCOUNTER — Ambulatory Visit (INDEPENDENT_AMBULATORY_CARE_PROVIDER_SITE_OTHER): Payer: BLUE CROSS/BLUE SHIELD | Admitting: Cardiology

## 2015-09-01 VITALS — BP 100/78 | HR 77 | Ht 62.0 in | Wt 196.0 lb

## 2015-09-01 DIAGNOSIS — R002 Palpitations: Secondary | ICD-10-CM

## 2015-09-01 DIAGNOSIS — E785 Hyperlipidemia, unspecified: Secondary | ICD-10-CM

## 2015-09-01 DIAGNOSIS — R600 Localized edema: Secondary | ICD-10-CM | POA: Insufficient documentation

## 2015-09-01 NOTE — Progress Notes (Signed)
Patient ID: Katherine Rosario, female   DOB: 09/30/1970, 44 y.o.   MRN: [id number]    Patient Name: Katherine Rosario Date of Encounter: 09/01/2015  Primary Care Provider:  Gwendolyn Grant, MD Primary Cardiologist:  Dorothy Spark  Problem List   Past Medical History  Diagnosis Date  . GERD (gastroesophageal reflux disease)   . Allergic rhinitis   . Hyperlipidemia   . Asthma   . Palpitations   . Heart palpitations   . Fibroids   . Anemia     Heavy menstrual flow/OTC FE BID:    . OSA (obstructive sleep apnea) 05/06/2014   Past Surgical History  Procedure Laterality Date  . No past surgeries     Allergies  No Known Allergies  HPI  44 year old female with h/o palpitations and skipped beats since her 81' when she was started on atenolol. At the times she was bradycardiac and the dose has been decreased from 50 to 12.5 mg po daily. She has no h/o HTN, or DM. She has gained 50 lbs in the last year.  She has seen Dr Linna Darner for 1 episode of near syncope and 1 syncopal episode, both in the morning when getting out of the shower. There was no associated palpitations. On the second occasion she hit her eye.   She has never smoked, no family h/o premature CAD. NO h/o a-fib. Dr Linna Darner was concerned of poor R wave progression in the precordial leads.  She used to be very active, now she doesn't exercise and has sedentary job and is most probably going through menopause.  09/01/2015 - the patient is coming after 1 year, she reports that her palpitations are less frequent and she is much less anxious about it. She is very active and denies any syncope no chest pain or shortness of breath. Her Holter monitor showed only occasional PVCs. Her atenolol was stopped and she is tolerating it very well actually she feels much less fatigue. Denies chest pain or DOE. She recently had a physical with blood work, but doesn't kknow her lipids results.   Home Medications  Prior to Admission medications     Medication Sig Start Date End Date Taking? Authorizing Provider  albuterol (PROVENTIL HFA;VENTOLIN HFA) 108 (90 BASE) MCG/ACT inhaler Inhale 2 puffs into the lungs every 4 (four) hours as needed for shortness of breath.    Historical Provider, MD  amoxicillin (AMOXIL) 500 MG capsule Take 1 capsule (500 mg total) by mouth 3 (three) times daily. 06/09/14   Hendricks Limes, MD  atenolol (TENORMIN) 50 MG tablet Take 12.5 mg by mouth daily.  05/18/14   Hendricks Limes, MD  calcium-vitamin D (OSCAL WITH D) 500-200 MG-UNIT per tablet Take 1 tablet by mouth daily with breakfast.    Historical Provider, MD  fluticasone (FLONASE) 50 MCG/ACT nasal spray USE 1 SPRAY IN EACH NOSTRILDAILY 02/11/14   Rowe Clack, MD  HYDROcodone-homatropine (HYDROMET) 5-1.5 MG/5ML syrup Take 5 mLs by mouth every 6 (six) hours as needed for cough. 06/09/14   Hendricks Limes, MD  loratadine (CLARITIN) 10 MG tablet Take 10 mg by mouth every morning.    Historical Provider, MD  montelukast (SINGULAIR) 10 MG tablet Take 10 mg by mouth every morning.    Historical Provider, MD  NEOMYCIN-POLYMYXIN-HYDROCORTISONE (CORTISPORIN) 1 % SOLN otic solution Place 3 drops into the left ear 4 (four) times daily. 06/09/14   Hendricks Limes, MD  omeprazole (PRILOSEC) 20 MG capsule Take 20 mg by mouth every  morning.    Historical Provider, MD    Family History  Family History  Problem Relation Age of Onset  . Hyperlipidemia Mother   . Hypertension Mother   . Colon cancer Father 83  . Hyperlipidemia Father   . Hypertension Father   . Prostate cancer Father 70  . Heart attack Neg Hx   . Stroke Neg Hx     Social History  Social History   Social History  . Marital Status: Married    Spouse Name: N/A  . Number of Children: N/A  . 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 Smoking status: Never Smoker   . Smokeless tobacco: Current User     Types: Snuff  . Alcohol Use: Yes  . Drug Use: No  . Sexual Activity: No   Other Topics Concern  . 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      Pt lives with wife, in a one story home. Has some college education.      Review of Systems, as per HPI, otherwise negative General:  No chills, fever, night sweats or weight changes.  Cardiovascular:  No chest pain, dyspnea on exertion, edema, orthopnea, palpitations, paroxysmal nocturnal dyspnea. Dermatological: No rash, lesions/masses Respiratory: No cough, dyspnea Urologic: No hematuria, dysuria Abdominal:   No nausea, vomiting, diarrhea, bright red blood per rectum, melena, or hematemesis Neurologic:  No visual changes, wkns, changes in mental status. All other systems reviewed and are otherwise negative except as noted above.  Physical Exam  Blood pressure 100/78, pulse 77, height 5\' 2"  (1.575 m), weight 196 lb (88.905 kg).  General: Pleasant, NAD Psych: Normal affect. Neuro: Alert and oriented X 3. Moves all extremities spontaneously. HEENT: Normal  Neck: Supple without bruits or JVD. Lungs:  Resp regular and unlabored, CTA. Heart: RRR no s3, s4, or murmurs. Abdomen: Soft, non-tender, non-distended, BS + x 4.  Extremities: No clubbing, cyanosis, trace lower extremity edema . DP/PT/Radials 2+ and equal bilaterally.  Labs:  No results for input(s): CKTOTAL, CKMB, TROPONINI in the last 72 hours. Lab Results  Component Value Date   WBC 9.9 10/26/2014   HGB 13.8 10/26/2014   HCT 41.0 10/26/2014   MCV 92.1 10/26/2014   PLT 324 10/26/2014    Lab Results  Component Value Date   DDIMER 0.27 02/09/2014   Invalid input(s): POCBNP    Component Value Date/Time   NA 139 10/26/2014 0429   K 3.5 10/26/2014 0429   CL 109 10/26/2014 0429   CO2 21 10/26/2014 0429   GLUCOSE 97 10/26/2014 0429   BUN 12 10/26/2014 0429   CREATININE 0.85 10/26/2014 0429    CALCIUM 8.8 10/26/2014 0429   PROT 7.4 05/01/2014 1436   ALBUMIN 3.9 05/01/2014 1436   AST 40* 05/01/2014 1436   ALT 69* 05/01/2014 1436   ALKPHOS 68 05/01/2014 1436   BILITOT 0.4 05/01/2014 1436   GFRNONAA 81* 10/26/2014 0429   GFRAA >90 10/26/2014 0429   Lab Results  Component Value Date   CHOL 211* 08/25/2013   HDL 44.70 08/25/2013   LDLCALC 159 07/10/2013   TRIG 219.0* 08/25/2013    Accessory Clinical Findings  echocardiogram  ECG - SR, 76 BPM, low voltage in the precordial leads    Assessment & Plan  44 year old female  1. Recurrent syncope - most probably orthostatic hypotension,  we stopped atenolol she had no recurrent syncope since then.  2. Palpitations - normal 48 hour Holter showed only occasional PVCs. No further management needed for days.   3. Poor R wave progression, lower extremity edema, resolved LE edema, normal echocardiogram.  4. Hyperlipidemia - LDL 159, TAG 144, HDL 35 - for now just diet and exercise, she will fax [place] repeat lipids, if still LDL > 130 start statins, she is agreeable.  Follow up in 1 year  Dorothy Spark, MD, [facility] 09/01/2015, 4:16 PM

## 2015-09-01 NOTE — Patient Instructions (Signed)

## 2015-09-10 ENCOUNTER — Other Ambulatory Visit: Payer: Self-pay | Admitting: Cardiology

## 2015-09-15 ENCOUNTER — Encounter: Payer: Self-pay | Admitting: Internal Medicine

## 2015-09-27 ENCOUNTER — Encounter: Payer: Self-pay | Admitting: Neurology

## 2015-09-27 NOTE — Telephone Encounter (Signed)
Patient last seen 07/11/16, has f/u on 11/12/15. Would like to know if you would fill out FMLA forms for her. Please advise.

## 2015-11-12 ENCOUNTER — Ambulatory Visit: Payer: BLUE CROSS/BLUE SHIELD | Admitting: Neurology

## 2015-11-22 ENCOUNTER — Other Ambulatory Visit: Payer: Self-pay

## 2015-11-22 DIAGNOSIS — Z1231 Encounter for screening mammogram for malignant neoplasm of breast: Secondary | ICD-10-CM

## 2015-12-08 ENCOUNTER — Other Ambulatory Visit: Payer: Self-pay | Admitting: Cardiology

## 2015-12-08 ENCOUNTER — Ambulatory Visit (INDEPENDENT_AMBULATORY_CARE_PROVIDER_SITE_OTHER): Payer: BLUE CROSS/BLUE SHIELD | Admitting: Adult Health

## 2015-12-08 ENCOUNTER — Encounter: Payer: Self-pay | Admitting: Adult Health

## 2015-12-08 VITALS — BP 100/78 | Temp 98.0°F | Wt 197.2 lb

## 2015-12-08 DIAGNOSIS — G43C0 Periodic headache syndromes in child or adult, not intractable: Secondary | ICD-10-CM | POA: Diagnosis not present

## 2015-12-08 DIAGNOSIS — Z7189 Other specified counseling: Secondary | ICD-10-CM

## 2015-12-08 DIAGNOSIS — J301 Allergic rhinitis due to pollen: Secondary | ICD-10-CM

## 2015-12-08 DIAGNOSIS — Z7689 Persons encountering health services in other specified circumstances: Secondary | ICD-10-CM

## 2015-12-08 MED ORDER — OMEPRAZOLE 20 MG PO CPDR: 20.0000 mg | DELAYED_RELEASE_CAPSULE | Freq: Every day | ORAL | Status: DC

## 2015-12-08 MED ORDER — MONTELUKAST SODIUM 10 MG PO TABS: 10.0000 mg | ORAL_TABLET | Freq: Every morning | ORAL | Status: DC

## 2015-12-08 NOTE — Progress Notes (Signed)
Pre visit review using our clinic review tool, if applicable. No additional management support is needed unless otherwise documented below in the visit note. 

## 2015-12-08 NOTE — Progress Notes (Signed)
Patient presents to clinic today to establish care.She is a very pleasant caucasian female who  has a past medical history of GERD (gastroesophageal reflux disease); Allergic rhinitis; Hyperlipidemia; Asthma; Heart palpitations; Fibroids; Anemia; OSA (obstructive sleep apnea) (05/06/2014); and Migraines.  Her last physical was in 2014 with Dr. Basilio Cairo  Acute Concerns:  Establish Care  Chronic Issues: Migraines - Down to 1 or two a month since starting Topamax  Seasonal Allergies - Controlled with current dose.   Health Maintenance: Dental -- Twice a year  Vision -- Yearly Immunizations -- UTD Colonoscopy -- 2014 - 5 years due to polyps Mammogram --11/2014 PAP -- 11/2015, has had abnormal in the past - 20 years ago Diet: Eats healthy Exercise: Does not exercise as much as she used to. She is walking 10,000 steps + a day.   Is Followed by Mickeal Skinner with GYN Neurology - Dr. Tomi Likens   Past Medical History  Diagnosis Date  . GERD (gastroesophageal reflux disease)   . Allergic rhinitis   . Hyperlipidemia   . Asthma   . Palpitations   . Heart palpitations   . Fibroids   . Anemia     Heavy menstrual flow/OTC FE BID:    . OSA (obstructive sleep apnea) 05/06/2014    Past Surgical History  Procedure Laterality Date  . No past surgeries      Current Outpatient Prescriptions on File Prior to Visit  Medication Sig Dispense Refill  . albuterol (PROVENTIL HFA;VENTOLIN HFA) 108 (90 BASE) MCG/ACT inhaler Inhale 2 puffs into the lungs every 4 (four) hours as needed for shortness of breath.    . calcium-vitamin D (OSCAL WITH D) 500-200 MG-UNIT per tablet Take 1 tablet by mouth daily with breakfast.    . fluticasone (FLONASE) 50 MCG/ACT nasal spray USE 1 SPRAY IN EACH NOSTRILDAILY 16 g 2  . hydrochlorothiazide (HYDRODIURIL) 25 MG tablet Take 1 tablet (25 mg total) by mouth daily as needed (FOR LOWER EXTREMITY EDEMA). 90 tablet 0  . loratadine (CLARITIN) 10 MG tablet Take 10 mg by  mouth every morning.    . montelukast (SINGULAIR) 10 MG tablet Take 1 tablet (10 mg total) by mouth every morning. 90 tablet 3  . naproxen sodium (ANAPROX) 550 MG tablet Take 1 tablet (550 mg total) by mouth every 12 (twelve) hours as needed. 15 tablet 0  . omeprazole (PRILOSEC) 20 MG capsule Take 20 mg by mouth daily.    . ondansetron (ZOFRAN ODT) 4 MG disintegrating tablet Take 1 tablet (4 mg total) by mouth every 8 (eight) hours as needed for nausea or vomiting. 20 tablet 2  . SUMAtriptan (IMITREX) 100 MG tablet I @ ONSET of migraine;repeat in 2 hours if needed 10 tablet 1  . topiramate (TOPAMAX) 25 MG tablet Take 1 tablet (25 mg total) by mouth 2 (two) times daily. 180 tablet 1  . vitamin E 400 UNIT capsule Take 400 Units by mouth daily.     No current facility-administered medications on file prior to visit.    No Known Allergies  Family History  Problem Relation Age of Onset  . Hyperlipidemia Mother   . Hypertension Mother   . Colon cancer Father 74  . Hyperlipidemia Father   . Hypertension Father   . Prostate cancer Father 25  . Heart attack Neg Hx   . Stroke Neg Hx     Social History   Social History  . Marital Status: Married    Spouse Name: N/A  .  Number of Children: N/A  . 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 Smoking status: Never Smoker   . Smokeless tobacco: Current User    Types: Snuff  . Alcohol Use: Yes  . Drug Use: No  . Sexual Activity: No   Other Topics Concern  . 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      Pt lives with wife, in a one story home. Has some college education.     Review of Systems  Constitutional: Negative.   HENT: Negative.   Eyes: Negative.   Respiratory: Negative.   Cardiovascular: Negative.   Gastrointestinal: Negative.   Genitourinary: Negative.     Musculoskeletal: Negative.   Skin: Negative.   Neurological: Negative.   Endo/Heme/Allergies: Negative.   Psychiatric/Behavioral: Negative.        BP 100/78 mmHg  Temp(Src) 98 F (36.7 C) (Oral)  Wt 197 lb 3.2 oz (89.449 kg)  Physical Exam  Constitutional: She is oriented to person, place, and time and well-developed, well-nourished, and in no distress. No distress.  HENT:  Head: Normocephalic and atraumatic.  Right Ear: External ear normal.  Left Ear: External ear normal.  Nose: Nose normal.  Mouth/Throat: Oropharynx is clear and moist. No oropharyngeal exudate.  Cardiovascular: Normal rate, regular rhythm, normal heart sounds and intact distal pulses.  Exam reveals no gallop.   No murmur heard. Pulmonary/Chest: Effort normal and breath sounds normal. No respiratory distress. She has no wheezes. She has no rales. She exhibits no tenderness.  Abdominal: Soft.  Obese around abdomen  Musculoskeletal: Normal range of motion.  Neurological: She is alert and oriented to person, place, and time. Gait normal. GCS score is 15.  Skin: Skin is warm and dry. No rash noted. She is not diaphoretic. No erythema. No pallor.  Psychiatric: Memory, affect and judgment normal.  Nursing note and vitals reviewed.   Assessment/Plan: 1. Encounter to establish care - Follow up for CPE - She needs to exercise more to lose some weight - Continue to eat healthy.  - Most recent biometric readings at her work show Total Cholesterol 219, HDL 46, LDL 157, Triglycerides 75  2. Allergic rhinitis due to pollen - Stay with current therapy.   3. Periodic headache syndrome, not intractable - Continue with Topamax as this is working well. Follow up with Dr. Tomi Likens as needed   Dorothyann Peng, NP

## 2015-12-08 NOTE — Patient Instructions (Signed)
It was great seeing you again!  Please follow up with me for your physical.   Work on increasing exercise.   Please let me know if you need anything.

## 2015-12-10 ENCOUNTER — Ambulatory Visit
Admission: RE | Admit: 2015-12-10 | Discharge: 2015-12-10 | Disposition: A | Discharge status: Home / Self Care | Payer: BLUE CROSS/BLUE SHIELD | Source: Ambulatory Visit

## 2015-12-10 DIAGNOSIS — Z1231 Encounter for screening mammogram for malignant neoplasm of breast: Secondary | ICD-10-CM

## 2015-12-21 ENCOUNTER — Ambulatory Visit: Payer: BLUE CROSS/BLUE SHIELD | Admitting: Pulmonary Disease

## 2015-12-21 ENCOUNTER — Other Ambulatory Visit: Payer: Self-pay | Admitting: Internal Medicine

## 2015-12-21 NOTE — Telephone Encounter (Signed)
Ok to refill for one year  

## 2015-12-21 NOTE — Telephone Encounter (Signed)
Routing to patient's new pcp 

## 2016-01-20 ENCOUNTER — Ambulatory Visit (INDEPENDENT_AMBULATORY_CARE_PROVIDER_SITE_OTHER): Payer: BLUE CROSS/BLUE SHIELD | Admitting: Neurology

## 2016-01-20 ENCOUNTER — Encounter: Payer: Self-pay | Admitting: Neurology

## 2016-01-20 VITALS — BP 126/74 | HR 74 | Ht 62.0 in | Wt 198.0 lb

## 2016-01-20 DIAGNOSIS — G43109 Migraine with aura, not intractable, without status migrainosus: Secondary | ICD-10-CM

## 2016-01-20 MED ORDER — SUMATRIPTAN SUCCINATE 100 MG PO TABS: ORAL_TABLET | ORAL | Status: DC

## 2016-01-20 NOTE — Progress Notes (Signed)
NEUROLOGY FOLLOW UP OFFICE NOTE  Katherine Rosario [id number]  HISTORY OF PRESENT ILLNESS: Katherine Rosario is a 45 year old right-handed female with hypertension, asthma and mild OSA who follows up for migraine.  UPDATE: MRI of brain performed 07/22/15 was personally reviewed and was normal.  Intensity:  5/10 Duration:  Quickly with sumatriptan and naproxen together Frequency:  3 to 4 days per month. Current NSAIDS:  naproxen 550mg  Current analgesics:  none Current triptans:  sumatriptan 100mg  Current anti-emetic:  none Current muscle relaxants:  none Current anti-anxiolytic:  none Current sleep aide:  Ambien Current Antihypertensive medications:  HCTZ Current Antidepressant medications:  none Current Anticonvulsant medications:  topiramate 25mg  twice daily Current Vitamins/Herbal/Supplements:  none Current Antihistamines/Decongestants:  none Other therapy:  chiropractic medicine  Caffeine:  1 cup of coffee daily Alcohol:  occasionally Smoker:  No (spouse smokes) Diet:  Healthy, increased water intake Exercise:  no Depression/stress:  improved Sleep hygiene:  Good since started CPAP  HISTORY: Onset:  Over the past few months.  She does report remote history of migraines as a teenager but cannot remember if they were different. Location:  Right temporal Quality:  throbbing Initial Intensity:  8/10 Aura:  Fuzzy vision in right eye precedes onset of headache Prodrome:  lethargy Associated symptoms:  Nausea, photophobia, phonophobia, sometimes vomiting.  On one occasion, she had vertigo. Initial Duration:  2 to 3 days.  She took sumatriptan once and it lasted 2 hours. Initial Frequency:  2 to 3 times a week Triggers/exacerbating factors:  Maybe second hand cigarette smoke Relieving factors:  Essential oils Activity:  Misses work   Sed Rate was 20.  Past abortive medication:  Excedrin, ibuprofen, Tylenol Past preventative medication:  none Other past therapy:   none  Family history of headache:  mother  PAST MEDICAL HISTORY: Past Medical History  Diagnosis Date  . GERD (gastroesophageal reflux disease)   . Allergic rhinitis   . Hyperlipidemia   . Asthma   . Heart palpitations   . Fibroids   . Anemia     Heavy menstrual flow/OTC FE BID:    . OSA (obstructive sleep apnea) 05/06/2014  . Migraines     MEDICATIONS: Current Outpatient Prescriptions on File Prior to Visit  Medication Sig Dispense Refill  . albuterol (PROVENTIL HFA;VENTOLIN HFA) 108 (90 BASE) MCG/ACT inhaler Inhale 2 puffs into the lungs every 4 (four) hours as needed for shortness of breath.    . calcium-vitamin D (OSCAL WITH D) 500-200 MG-UNIT per tablet Take 1 tablet by mouth daily with breakfast.    . fluticasone (FLONASE) 50 MCG/ACT nasal spray USE 1 SPRAY IN EACH NOSTRILDAILY 16 g 2  . hydrochlorothiazide (HYDRODIURIL) 25 MG tablet TAKE 1 TABLET DAILY AS     NEEDED FOR LOWER EXTREMITY EDEMA 90 tablet 1  . loratadine (CLARITIN) 10 MG tablet Take 10 mg by mouth every morning.    . montelukast (SINGULAIR) 10 MG tablet Take 1 tablet (10 mg total) by mouth every morning. 90 tablet 3  . naproxen sodium (ANAPROX) 550 MG tablet Take 1 tablet (550 mg total) by mouth every 12 (twelve) hours as needed. 15 tablet 0  . omeprazole (PRILOSEC) 20 MG capsule Take 1 capsule (20 mg total) by mouth daily. 90 capsule 3  . ondansetron (ZOFRAN ODT) 4 MG disintegrating tablet Take 1 tablet (4 mg total) by mouth every 8 (eight) hours as needed for nausea or vomiting. 20 tablet 2  . topiramate (TOPAMAX) 25 MG tablet TAKE 1 TABLET  TWICE A DAY 180 tablet 1  . vitamin E 400 UNIT capsule Take 400 Units by mouth daily.     No current facility-administered medications on file prior to visit.    ALLERGIES: No Known Allergies  FAMILY HISTORY: Family History  Problem Relation Age of Onset  . Hyperlipidemia Mother   . Hypertension Mother   . Colon cancer Father 29  . Hyperlipidemia Father   .  Hypertension Father   . Prostate cancer Father 63  . Heart attack Neg Hx   . Stroke Neg Hx     SOCIAL HISTORY: Social History   Social History  . Marital Status: Married    Spouse Name: N/A  . Number of Children: N/A  . 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 Smoking status: Never Smoker   . Smokeless tobacco: Current User    Types: Snuff  . Alcohol Use: 0.0 oz/week    0 Standard drinks or equivalent per week     Comment: occ  . Drug Use: No  . Sexual Activity: No   Other Topics Concern  . 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      Pt lives with wife, in a one story home. Has some college education.       She likes to hike and swim.     REVIEW OF SYSTEMS: Constitutional: No fevers, chills, or sweats, no generalized fatigue, change in appetite Eyes: No visual changes, double vision, eye pain Ear, nose and throat: No hearing loss, ear pain, nasal congestion, sore throat Cardiovascular: No chest pain, palpitations Respiratory:  No shortness of breath at rest or with exertion, wheezes GastrointestinaI: No nausea, vomiting, diarrhea, abdominal pain, fecal incontinence Genitourinary:  No dysuria, urinary retention or frequency Musculoskeletal:  No neck pain, back pain Integumentary: No rash, pruritus, skin lesions Neurological: as above Psychiatric: No depression, insomnia, anxiety Endocrine: No palpitations, fatigue, diaphoresis, mood swings, change in appetite, change in weight, increased thirst Hematologic/Lymphatic:  No purpura, petechiae. Allergic/Immunologic: no itchy/runny eyes, nasal congestion, recent allergic reactions, rashes  PHYSICAL EXAM: Filed Vitals:   01/20/16 1122  BP: 126/74  Pulse: 74   General: No acute distress.  Patient appears well-groomed.  normal body habitus. Head:   Normocephalic/atraumatic Eyes:  Fundi examined but not visualized Neck: supple, no paraspinal tenderness, full range of motion Heart:  Regular rate and rhythm Lungs:  Clear to auscultation bilaterally Back: No paraspinal tenderness Neurological Exam: alert and oriented to person, place, and time. Attention span and concentration intact, recent and remote memory intact, fund of knowledge intact.  Speech fluent and not dysarthric, language intact.  CN II-XII intact. Bulk and tone normal, muscle strength 5/5 throughout.  Sensation to light touch, temperature and vibration intact.  Deep tendon reflexes 2+ throughout, toes downgoing.  Finger to nose and heel to shin testing intact.  Gait normal, Romberg negative.  IMPRESSION: Migraine with aura  PLAN: 1.  Topiramate 25mg  twice daily 2.  Sumatriptan 100mg  and naproxen 550mg  for abortive therapy 3.  Sleep hygiene 4.  Exercise 5.  Will fill FMLA forms 6.  Follow up in 6 months.  15 minutes spent face to face with patient, over 50% spent discussing management.  Metta Clines, DO  CC:  Dorothyann Peng, NP

## 2016-01-20 NOTE — Patient Instructions (Signed)
Migraine Recommendations: 1.  Continue topiramate 25mg  twice daily 2.  Take sumatriptan 100mg  with naproxen 550mg  at earliest onset of headache.  May repeat dose once in 2 hours if needed.  Do not exceed two doses in 24 hours. 3.  Limit use of pain relievers to no more than 2 days out of the week.  These medications include acetaminophen, ibuprofen, triptans and narcotics.  This will help reduce risk of rebound headaches. 4.  Be aware of common food triggers such as processed sweets, processed foods with nitrites (such as deli meat, hot dogs, sausages), foods with MSG, alcohol (such as wine), chocolate, certain cheeses, certain fruits (dried fruits, some citrus fruit), vinegar, diet soda. 4.  Avoid caffeine 5.  Routine exercise 6.  Proper sleep hygiene 7.  Stay adequately hydrated with water 8.  Keep a headache diary. 9.  Maintain proper stress management. 10.  Do not skip meals. 11.  Consider supplements:  Magnesium oxide 400mg  to 600mg  daily, riboflavin 400mg , Coenzyme Q 10 100mg  three times daily 12.  Will fill out FMLA 13.  Follow up in 6 months.

## 2016-07-25 ENCOUNTER — Ambulatory Visit: Payer: BLUE CROSS/BLUE SHIELD | Admitting: Neurology

## 2016-08-08 ENCOUNTER — Ambulatory Visit: Payer: BLUE CROSS/BLUE SHIELD | Admitting: Neurology

## 2016-09-14 ENCOUNTER — Encounter: Payer: Self-pay | Admitting: Adult Health

## 2016-09-14 ENCOUNTER — Ambulatory Visit (INDEPENDENT_AMBULATORY_CARE_PROVIDER_SITE_OTHER): Payer: Self-pay | Admitting: Adult Health

## 2016-09-14 VITALS — BP 118/64 | Temp 98.4°F | Ht 62.0 in | Wt 200.4 lb

## 2016-09-14 DIAGNOSIS — R6889 Other general symptoms and signs: Secondary | ICD-10-CM

## 2016-09-14 LAB — POCT INFLUENZA A/B
INFLUENZA B, POC: NEGATIVE
Influenza A, POC: NEGATIVE

## 2016-09-14 NOTE — Progress Notes (Signed)
Subjective:    Patient ID: Katherine Rosario, female    DOB: [birth year], 46 y.o.   MRN: [id number]  HPI  46 year old female who presents to the office today with flu like symptoms. She reports that her symptoms started about 5 days ago. Her symptoms include that of a fever up to 103, generalized body aches, dry cough, wheezing, nasal congestion, headache.   Her wife was sick last week with the same symptoms.   She has been using Motrin to help with fever and symptom relief.   Review of Systems  Constitutional: Positive for activity change, appetite change, chills, diaphoresis, fatigue and fever.  HENT: Positive for congestion, rhinorrhea, sinus pain, sinus pressure and sore throat.   Respiratory: Positive for cough and wheezing. Negative for chest tightness and stridor.   Cardiovascular: Negative.   Musculoskeletal: Positive for myalgias.  Neurological: Positive for headaches. Negative for dizziness, weakness and light-headedness.  Hematological: Positive for adenopathy.   Past Medical History:  Diagnosis Date  . Allergic rhinitis   . Anemia    Heavy menstrual flow/OTC FE BID:    . Asthma   . Fibroids   . GERD (gastroesophageal reflux disease)   . Heart palpitations   . Hyperlipidemia   . Migraines   . OSA (obstructive sleep apnea) 05/06/2014    Social History   Social History  . Marital status: Married    Spouse name: N/A  . Number of children: N/A  . Years of education: N/A   Occupational History  . 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 Smoking status: Never Smoker  . Smokeless tobacco: Current User    Types: Snuff  . Alcohol use 0.0 oz/week     Comment: occ  . Drug use: No  . Sexual activity: No   Other Topics Concern  . 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      Pt lives with wife, in a one story home. Has some college education.       She  likes to hike and swim.     Past Surgical History:  Procedure Laterality Date  . UTERINE FIBROID EMBOLIZATION      Family History  Problem Relation Age of Onset  . Hyperlipidemia Mother   . Hypertension Mother   . Colon cancer Father 68  . Hyperlipidemia Father   . Hypertension Father   . Prostate cancer Father 32  . Heart attack Neg Hx   . Stroke Neg Hx     No Known Allergies  Current Outpatient Prescriptions on File Prior to Visit  Medication Sig Dispense Refill  . albuterol (PROVENTIL HFA;VENTOLIN HFA) 108 (90 BASE) MCG/ACT inhaler Inhale 2 puffs into the lungs every 4 (four) hours as needed for shortness of breath.    . calcium-vitamin D (OSCAL WITH D) 500-200 MG-UNIT per tablet Take 1 tablet by mouth daily with breakfast.    . fluticasone (FLONASE) 50 MCG/ACT nasal spray USE 1 SPRAY IN EACH NOSTRILDAILY 16 g 2  . hydrochlorothiazide (HYDRODIURIL) 25 MG tablet TAKE 1 TABLET DAILY AS     NEEDED FOR LOWER EXTREMITY EDEMA 90 tablet 1  . loratadine (CLARITIN) 10 MG tablet Take 10 mg by mouth every morning.    . montelukast (SINGULAIR) 10 MG tablet Take 1 tablet (10 mg total) by mouth every morning. 90 tablet 3  . naproxen sodium (ANAPROX) 550 MG tablet  Take 1 tablet (550 mg total) by mouth every 12 (twelve) hours as needed. 15 tablet 0  . omeprazole (PRILOSEC) 20 MG capsule Take 1 capsule (20 mg total) by mouth daily. 90 capsule 3  . ondansetron (ZOFRAN ODT) 4 MG disintegrating tablet Take 1 tablet (4 mg total) by mouth every 8 (eight) hours as needed for nausea or vomiting. 20 tablet 2  . SUMAtriptan (IMITREX) 100 MG tablet I @ ONSET of migraine;repeat in 2 hours if needed 27 tablet 2  . topiramate (TOPAMAX) 25 MG tablet TAKE 1 TABLET TWICE A DAY 180 tablet 1  . vitamin E 400 UNIT capsule Take 400 Units by mouth daily.     No current facility-administered medications on file prior to visit.     BP 118/64   Temp 98.4 F (36.9 C) (Oral)   Ht 5\' 2"  (1.575 m)   Wt 200 lb 6.4  oz (90.9 kg)   BMI 36.65 kg/m       Objective:   Physical Exam  Constitutional: She is oriented to person, place, and time. She appears well-developed and well-nourished. No distress.  HENT:  Head: Normocephalic and atraumatic.  Right Ear: Hearing, tympanic membrane, external ear and ear canal normal.  Left Ear: Hearing, tympanic membrane, external ear and ear canal normal.  Nose: No mucosal edema or rhinorrhea. Right sinus exhibits frontal sinus tenderness. Right sinus exhibits no maxillary sinus tenderness. Left sinus exhibits frontal sinus tenderness. Left sinus exhibits no maxillary sinus tenderness.  Mouth/Throat: Uvula is midline, oropharynx is clear and moist and mucous membranes are normal. No oropharyngeal exudate.  Eyes: Conjunctivae and EOM are normal. Pupils are equal, round, and reactive to light. Right eye exhibits no discharge. Left eye exhibits no discharge. No scleral icterus.  Neck: Normal range of motion. Neck supple.  Cardiovascular: Normal rate, regular rhythm, normal heart sounds and intact distal pulses.  Exam reveals no gallop and no friction rub.   No murmur heard. Pulmonary/Chest: Effort normal and breath sounds normal. No respiratory distress. She has no wheezes. She has no rales.  Lymphadenopathy:    She has cervical adenopathy.  Neurological: She is alert and oriented to person, place, and time.  Skin: Skin is warm and dry. No rash noted. She is not diaphoretic. No erythema. No pallor.  Psychiatric: She has a normal mood and affect. Her behavior is normal. Judgment and thought content normal.  Nursing note and vitals reviewed.     Assessment & Plan:  1. Flu-like symptoms - POC Influenza A/B- negative  - Despite negative flu her symptoms appear viral in nature.  - Advised continued conservative therapy - Follow up if no improvement or if symptoms worsen   Dorothyann Peng, NP

## 2016-10-17 ENCOUNTER — Other Ambulatory Visit: Payer: Self-pay | Admitting: Occupational Medicine

## 2016-10-17 ENCOUNTER — Ambulatory Visit: Payer: Self-pay

## 2016-10-17 DIAGNOSIS — Z0000 Encounter for general adult medical examination without abnormal findings: Secondary | ICD-10-CM

## 2017-01-09 ENCOUNTER — Telehealth: Payer: Self-pay | Admitting: Adult Health

## 2017-01-09 ENCOUNTER — Ambulatory Visit (INDEPENDENT_AMBULATORY_CARE_PROVIDER_SITE_OTHER): Payer: Managed Care, Other (non HMO) | Admitting: Adult Health

## 2017-01-09 ENCOUNTER — Ambulatory Visit (INDEPENDENT_AMBULATORY_CARE_PROVIDER_SITE_OTHER): Payer: Managed Care, Other (non HMO)

## 2017-01-09 ENCOUNTER — Other Ambulatory Visit: Payer: Self-pay | Admitting: Adult Health

## 2017-01-09 ENCOUNTER — Encounter: Payer: Self-pay | Admitting: Adult Health

## 2017-01-09 VITALS — BP 120/70 | Temp 97.9°F | Ht 62.0 in | Wt 198.2 lb

## 2017-01-09 DIAGNOSIS — M545 Low back pain, unspecified: Secondary | ICD-10-CM

## 2017-01-09 DIAGNOSIS — M25551 Pain in right hip: Secondary | ICD-10-CM | POA: Diagnosis not present

## 2017-01-09 MED ORDER — METHYLPREDNISOLONE 4 MG PO TBPK: ORAL_TABLET | ORAL | 0 refills | Status: DC

## 2017-01-09 MED ORDER — CYCLOBENZAPRINE HCL 10 MG PO TABS: 10.0000 mg | ORAL_TABLET | Freq: Every day | ORAL | 0 refills | Status: DC

## 2017-01-09 MED ORDER — CYCLOBENZAPRINE HCL 10 MG PO TABS
10.0000 mg | ORAL_TABLET | Freq: Every day | ORAL | 0 refills | Status: DC
Start: 2017-01-09 — End: 2017-05-11

## 2017-01-09 MED ORDER — TRAMADOL HCL 50 MG PO TABS: 50.0000 mg | ORAL_TABLET | Freq: Two times a day (BID) | ORAL | 0 refills | Status: DC | PRN

## 2017-01-09 NOTE — Telephone Encounter (Signed)
Updated patient on her x rays.   Her symptoms appears to be more muscular in nature. We will trial muscle relaxer and prednisone

## 2017-01-09 NOTE — Progress Notes (Signed)
Subjective:    Patient ID: Katherine Rosario, female    DOB: 07-30-1971, 46 y.o.   MRN: [id number]  HPI  46 year old female who presents to the office today for low back pain and right hip pain x 1 month. She reports that " some days the pain is better". Pain is worse with walking and certain movements such as bending and twisting.   She has been using Tylenol and motrin for for symptoms relief, which has helped minimally  Denies any trauma, numbness or tingling in lower extremities or issues with bowel or bladder. She has not noticed any bruising   Review of Systems See HPI   Past Medical History:  Diagnosis Date  . Allergic rhinitis   . Anemia    Heavy menstrual flow/OTC FE BID:    . Asthma   . Fibroids   . GERD (gastroesophageal reflux disease)   . Heart palpitations   . Hyperlipidemia   . Migraines   . OSA (obstructive sleep apnea) 05/06/2014    Social History   Social History  . Marital status: Married    Spouse name: N/A  . Number of children: N/A  . Years of education: N/A   Occupational History  . 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 Smoking status: Never Smoker  . Smokeless tobacco: Current User    Types: Snuff  . Alcohol use 0.0 oz/week     Comment: occ  . Drug use: No  . Sexual activity: No   Other Topics Concern  . 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      Pt lives with wife, in a one story home. Has some college education.       She likes to hike and swim.     Past Surgical History:  Procedure Laterality Date  . UTERINE FIBROID EMBOLIZATION      Family History  Problem Relation Age of Onset  . Hyperlipidemia Mother   . Hypertension Mother   . Colon cancer Father 12  . Hyperlipidemia Father   . Hypertension Father   . Prostate cancer Father 39  . Heart attack Neg Hx   . Stroke Neg Hx     No Known Allergies  Current  Outpatient Prescriptions on File Prior to Visit  Medication Sig Dispense Refill  . albuterol (PROVENTIL HFA;VENTOLIN HFA) 108 (90 BASE) MCG/ACT inhaler Inhale 2 puffs into the lungs every 4 (four) hours as needed for shortness of breath.    . calcium-vitamin D (OSCAL WITH D) 500-200 MG-UNIT per tablet Take 1 tablet by mouth daily with breakfast.    . fluticasone (FLONASE) 50 MCG/ACT nasal spray USE 1 SPRAY IN EACH NOSTRILDAILY 16 g 2  . hydrochlorothiazide (HYDRODIURIL) 25 MG tablet TAKE 1 TABLET DAILY AS     NEEDED FOR LOWER EXTREMITY EDEMA 90 tablet 1  . loratadine (CLARITIN) 10 MG tablet Take 10 mg by mouth every morning.    . montelukast (SINGULAIR) 10 MG tablet Take 1 tablet (10 mg total) by mouth every morning. 90 tablet 3  . naproxen sodium (ANAPROX) 550 MG tablet Take 1 tablet (550 mg total) by mouth every 12 (twelve) hours as needed. 15 tablet 0  . omeprazole (PRILOSEC) 20 MG capsule Take 1 capsule (20 mg total) by mouth daily. 90 capsule 3  . ondansetron (ZOFRAN ODT) 4 MG disintegrating tablet Take 1 tablet (4 mg  total) by mouth every 8 (eight) hours as needed for nausea or vomiting. 20 tablet 2  . SUMAtriptan (IMITREX) 100 MG tablet I @ ONSET of migraine;repeat in 2 hours if needed 27 tablet 2  . topiramate (TOPAMAX) 25 MG tablet TAKE 1 TABLET TWICE A DAY 180 tablet 1  . vitamin E 400 UNIT capsule Take 400 Units by mouth daily.     No current facility-administered medications on file prior to visit.     BP 120/70 (BP Location: Left Arm, Patient Position: Sitting, Cuff Size: Normal)   Temp 97.9 F (36.6 C) (Oral)   Ht 5\' 2"  (1.575 m)   Wt 198 lb 3.2 oz (89.9 kg)   BMI 36.25 kg/m       Objective:   Physical Exam  Constitutional: She is oriented to person, place, and time. She appears well-developed and well-nourished. No distress.  Cardiovascular: Normal rate, regular rhythm, normal heart sounds and intact distal pulses.  Exam reveals no gallop and no friction rub.   No  murmur heard. Pulmonary/Chest: Effort normal and breath sounds normal. No respiratory distress. She has no wheezes. She has no rales. She exhibits no tenderness.  Musculoskeletal: She exhibits tenderness (Spinal tednerness with palpation to lower lumbar. ).  "tightness" with knee to chest and external rotation. Relief from symptoms with internal rotation   Neurological: She is alert and oriented to person, place, and time.  Skin: Skin is warm and dry. No rash noted. She is not diaphoretic. No erythema. No pallor.  No bruising noted  Psychiatric: She has a normal mood and affect. Her behavior is normal. Judgment and thought content normal.  Nursing note and vitals reviewed.     Assessment & Plan:  1. Right hip pain - Appears to be more of a muscular injury in nature. Will get x rays and treat as needed.  - Consider referral to Sports medicine or orthopedics if needed - DG HIP UNILAT WITH PELVIS 2-3 VIEWS RIGHT; Future - traMADol (ULTRAM) 50 MG tablet; Take 1 tablet (50 mg total) by mouth every 12 (twelve) hours as needed.  Dispense: 15 tablet; Refill: 0  2. Acute midline low back pain without sciatica - DG Lumbar Spine Complete; Future - traMADol (ULTRAM) 50 MG tablet; Take 1 tablet (50 mg total) by mouth every 12 (twelve) hours as needed.  Dispense: 15 tablet; Refill: 0   Dorothyann Peng, NP

## 2017-03-12 DIAGNOSIS — H6982 Other specified disorders of Eustachian tube, left ear: Secondary | ICD-10-CM | POA: Insufficient documentation

## 2017-03-12 DIAGNOSIS — H6122 Impacted cerumen, left ear: Secondary | ICD-10-CM | POA: Insufficient documentation

## 2017-05-10 ENCOUNTER — Encounter: Payer: Self-pay | Admitting: Adult Health

## 2017-05-11 ENCOUNTER — Ambulatory Visit (INDEPENDENT_AMBULATORY_CARE_PROVIDER_SITE_OTHER): Payer: Managed Care, Other (non HMO) | Admitting: Adult Health

## 2017-05-11 ENCOUNTER — Encounter: Payer: Self-pay | Admitting: Adult Health

## 2017-05-11 VITALS — BP 112/72 | HR 79 | Temp 98.4°F | Ht 62.0 in | Wt 199.0 lb

## 2017-05-11 DIAGNOSIS — Z0000 Encounter for general adult medical examination without abnormal findings: Secondary | ICD-10-CM | POA: Diagnosis not present

## 2017-05-11 DIAGNOSIS — Z760 Encounter for issue of repeat prescription: Secondary | ICD-10-CM | POA: Diagnosis not present

## 2017-05-11 LAB — CBC WITH DIFFERENTIAL/PLATELET
BASOS ABS: 0.1 10*3/uL (ref 0.0–0.1)
Basophils Relative: 1.1 % (ref 0.0–3.0)
Eosinophils Absolute: 0.2 10*3/uL (ref 0.0–0.7)
Eosinophils Relative: 3.9 % (ref 0.0–5.0)
HCT: 41.7 % (ref 36.0–46.0)
Hemoglobin: 14 g/dL (ref 12.0–15.0)
LYMPHS ABS: 2.1 10*3/uL (ref 0.7–4.0)
Lymphocytes Relative: 34.1 % (ref 12.0–46.0)
MCHC: 33.5 g/dL (ref 30.0–36.0)
MCV: 92.4 fl (ref 78.0–100.0)
MONO ABS: 0.5 10*3/uL (ref 0.1–1.0)
Monocytes Relative: 7.8 % (ref 3.0–12.0)
NEUTROS ABS: 3.3 10*3/uL (ref 1.4–7.7)
NEUTROS PCT: 53.1 % (ref 43.0–77.0)
PLATELETS: 316 10*3/uL (ref 150.0–400.0)
RBC: 4.52 Mil/uL (ref 3.87–5.11)
RDW: 13.4 % (ref 11.5–15.5)
WBC: 6.1 10*3/uL (ref 4.0–10.5)

## 2017-05-11 LAB — HEPATIC FUNCTION PANEL
ALT: 31 U/L (ref 0–35)
AST: 25 U/L (ref 0–37)
Albumin: 4.2 g/dL (ref 3.5–5.2)
Alkaline Phosphatase: 60 U/L (ref 39–117)
BILIRUBIN TOTAL: 0.5 mg/dL (ref 0.2–1.2)
Bilirubin, Direct: 0.1 mg/dL (ref 0.0–0.3)
Total Protein: 6.5 g/dL (ref 6.0–8.3)

## 2017-05-11 LAB — BASIC METABOLIC PANEL
BUN: 11 mg/dL (ref 6–23)
CALCIUM: 9.4 mg/dL (ref 8.4–10.5)
CO2: 27 meq/L (ref 19–32)
Chloride: 103 mEq/L (ref 96–112)
Creatinine, Ser: 0.88 mg/dL (ref 0.40–1.20)
GFR: 72.7 mL/min (ref >60.00)
GLUCOSE: 85 mg/dL (ref 70–99)
Potassium: 4.2 mEq/L (ref 3.5–5.1)
Sodium: 138 mEq/L (ref 135–145)

## 2017-05-11 LAB — LIPID PANEL
CHOL/HDL RATIO: 5
CHOLESTEROL: 217 mg/dL — AB (ref 0–200)
HDL: 44.6 mg/dL (ref >39.00)
LDL CALC: 151 mg/dL — AB (ref 0–99)
NonHDL: 172.22
Triglycerides: 106 mg/dL (ref 0.0–149.0)
VLDL: 21.2 mg/dL (ref 0.0–40.0)

## 2017-05-11 LAB — TSH: TSH: 2.72 u[IU]/mL (ref 0.35–4.50)

## 2017-05-11 LAB — HEMOGLOBIN A1C: HEMOGLOBIN A1C: 5.5 % (ref 4.6–6.5)

## 2017-05-11 MED ORDER — MONTELUKAST SODIUM 10 MG PO TABS: 10.0000 mg | ORAL_TABLET | Freq: Every morning | ORAL | 3 refills | Status: DC

## 2017-05-11 MED ORDER — OMEPRAZOLE 20 MG PO CPDR: 20.0000 mg | DELAYED_RELEASE_CAPSULE | Freq: Every day | ORAL | 3 refills | Status: DC

## 2017-05-11 NOTE — Patient Instructions (Addendum)
It was great seeing you today   I will follow up with you regarding your blood work   Please let me know if you need anything    Health Maintenance, Female Adopting a healthy lifestyle and getting preventive care can go a long way to promote health and wellness. Talk with your health care provider about what schedule of regular examinations is right for you. This is a good chance for you to check in with your provider about disease prevention and staying healthy. In between checkups, there are plenty of things you can do on your own. Experts have done a lot of research about which lifestyle changes and preventive measures are most likely to keep you healthy. Ask your health care provider for more information. Weight and diet Eat a healthy diet  Be sure to include plenty of vegetables, fruits, low-fat dairy products, and lean protein.  Do not eat a lot of foods high in solid fats, added sugars, or salt.  Get regular exercise. This is one of the most important things you can do for your health. ? Most adults should exercise for at least 150 minutes each week. The exercise should increase your heart rate and make you sweat (moderate-intensity exercise). ? Most adults should also do strengthening exercises at least twice a week. This is in addition to the moderate-intensity exercise.  Maintain a healthy weight  Body mass index (BMI) is a measurement that can be used to identify possible weight problems. It estimates body fat based on height and weight. Your health care provider can help determine your BMI and help you achieve or maintain a healthy weight.  For females 55 years of age and older: ? A BMI below 18.5 is considered underweight. ? A BMI of 18.5 to 24.9 is normal. ? A BMI of 25 to 29.9 is considered overweight. ? A BMI of 30 and above is considered obese.  Watch levels of cholesterol and blood lipids  You should start having your blood tested for lipids and cholesterol at 48  years of age, then have this test every 5 years.  You may need to have your cholesterol levels checked more often if: ? Your lipid or cholesterol levels are high. ? You are older than 48 years of age. ? You are at high risk for heart disease.  Cancer screening Lung Cancer  Lung cancer screening is recommended for adults 20-64 years old who are at high risk for lung cancer because of a history of smoking.  A yearly low-dose CT scan of the lungs is recommended for people who: ? Currently smoke. ? Have quit within the past 15 years. ? Have at least a 30-pack-year history of smoking. A pack year is smoking an average of one pack of cigarettes a day for 1 year.  Yearly screening should continue until it has been 15 years since you quit.  Yearly screening should stop if you develop a health problem that would prevent you from having lung cancer treatment.  Breast Cancer  Practice breast self-awareness. This means understanding how your breasts normally appear and feel.  It also means doing regular breast self-exams. Let your health care provider know about any changes, no matter how small.  If you are in your 20s or 30s, you should have a clinical breast exam (CBE) by a health care provider every 1-3 years as part of a regular health exam.  If you are 80 or older, have a CBE every year. Also consider having a  breast X-ray (mammogram) every year.  If you have a family history of breast cancer, talk to your health care provider about genetic screening.  If you are at high risk for breast cancer, talk to your health care provider about having an MRI and a mammogram every year.  Breast cancer gene (BRCA) assessment is recommended for women who have family members with BRCA-related cancers. BRCA-related cancers include: ? Breast. ? Ovarian. ? Tubal. ? Peritoneal cancers.  Results of the assessment will determine the need for genetic counseling and BRCA1 and BRCA2 testing.  Cervical  Cancer Your health care provider may recommend that you be screened regularly for cancer of the pelvic organs (ovaries, uterus, and vagina). This screening involves a pelvic examination, including checking for microscopic changes to the surface of your cervix (Pap test). You may be encouraged to have this screening done every 3 years, beginning at age 48.  For women ages 32-65, health care providers may recommend pelvic exams and Pap testing every 3 years, or they may recommend the Pap and pelvic exam, combined with testing for human papilloma virus (HPV), every 5 years. Some types of HPV increase your risk of cervical cancer. Testing for HPV may also be done on women of any age with unclear Pap test results.  Other health care providers may not recommend any screening for nonpregnant women who are considered low risk for pelvic cancer and who do not have symptoms. Ask your health care provider if a screening pelvic exam is right for you.  If you have had past treatment for cervical cancer or a condition that could lead to cancer, you need Pap tests and screening for cancer for at least 20 years after your treatment. If Pap tests have been discontinued, your risk factors (such as having a new sexual partner) need to be reassessed to determine if screening should resume. Some women have medical problems that increase the chance of getting cervical cancer. In these cases, your health care provider may recommend more frequent screening and Pap tests.  Colorectal Cancer  This type of cancer can be detected and often prevented.  Routine colorectal cancer screening usually begins at 48 years of age and continues through 48 years of age.  Your health care provider may recommend screening at an earlier age if you have risk factors for colon cancer.  Your health care provider may also recommend using home test kits to check for hidden blood in the stool.  A small camera at the end of a tube can be used to  examine your colon directly (sigmoidoscopy or colonoscopy). This is done to check for the earliest forms of colorectal cancer.  Routine screening usually begins at age 65.  Direct examination of the colon should be repeated every 5-10 years through 48 years of age. However, you may need to be screened more often if early forms of precancerous polyps or small growths are found.  Skin Cancer  Check your skin from head to toe regularly.  Tell your health care provider about any new moles or changes in moles, especially if there is a change in a mole's shape or color.  Also tell your health care provider if you have a mole that is larger than the size of a pencil eraser.  Always use sunscreen. Apply sunscreen liberally and repeatedly throughout the day.  Protect yourself by wearing long sleeves, pants, a wide-brimmed hat, and sunglasses whenever you are outside.  Heart disease, diabetes, and high blood pressure  High  blood pressure causes heart disease and increases the risk of stroke. High blood pressure is more likely to develop in: ? People who have blood pressure in the high end of the normal range (130-139/85-89 mm Hg). ? People who are overweight or obese. ? People who are African American.  If you are 30-64 years of age, have your blood pressure checked every 3-5 years. If you are 29 years of age or older, have your blood pressure checked every year. You should have your blood pressure measured twice-once when you are at a hospital or clinic, and once when you are not at a hospital or clinic. Record the average of the two measurements. To check your blood pressure when you are not at a hospital or clinic, you can use: ? An automated blood pressure machine at a pharmacy. ? A home blood pressure monitor.  If you are between 37 years and 32 years old, ask your health care provider if you should take aspirin to prevent strokes.  Have regular diabetes screenings. This involves taking a  blood sample to check your fasting blood sugar level. ? If you are at a normal weight and have a low risk for diabetes, have this test once every three years after 48 years of age. ? If you are overweight and have a high risk for diabetes, consider being tested at a younger age or more often. Preventing infection Hepatitis B  If you have a higher risk for hepatitis B, you should be screened for this virus. You are considered at high risk for hepatitis B if: ? You were born in a country where hepatitis B is common. Ask your health care provider which countries are considered high risk. ? Your parents were born in a high-risk country, and you have not been immunized against hepatitis B (hepatitis B vaccine). ? You have HIV or AIDS. ? You use needles to inject street drugs. ? You live with someone who has hepatitis B. ? You have had sex with someone who has hepatitis B. ? You get hemodialysis treatment. ? You take certain medicines for conditions, including cancer, organ transplantation, and autoimmune conditions.  Hepatitis C  Blood testing is recommended for: ? Everyone born from [birth year] through 1967. ? Anyone with known risk factors for hepatitis C.  Sexually transmitted infections (STIs)  You should be screened for sexually transmitted infections (STIs) including gonorrhea and chlamydia if: ? You are sexually active and are younger than 48 years of age. ? You are older than 48 years of age and your health care provider tells you that you are at risk for this type of infection. ? Your sexual activity has changed since you were last screened and you are at an increased risk for chlamydia or gonorrhea. Ask your health care provider if you are at risk.  If you do not have HIV, but are at risk, it may be recommended that you take a prescription medicine daily to prevent HIV infection. This is called pre-exposure prophylaxis (PrEP). You are considered at risk if: ? You are sexually active and  do not regularly use condoms or know the HIV status of your partner(s). ? You take drugs by injection. ? You are sexually active with a partner who has HIV.  Talk with your health care provider about whether you are at high risk of being infected with HIV. If you choose to begin PrEP, you should first be tested for HIV. You should then be tested every 3 months for  as long as you are taking PrEP. Pregnancy  If you are premenopausal and you may become pregnant, ask your health care provider about preconception counseling.  If you may become pregnant, take 400 to 800 micrograms (mcg) of folic acid every day.  If you want to prevent pregnancy, talk to your health care provider about birth control (contraception). Osteoporosis and menopause  Osteoporosis is a disease in which the bones lose minerals and strength with aging. This can result in serious bone fractures. Your risk for osteoporosis can be identified using a bone density scan.  If you are 56 years of age or older, or if you are at risk for osteoporosis and fractures, ask your health care provider if you should be screened.  Ask your health care provider whether you should take a calcium or vitamin D supplement to lower your risk for osteoporosis.  Menopause may have certain physical symptoms and risks.  Hormone replacement therapy may reduce some of these symptoms and risks. Talk to your health care provider about whether hormone replacement therapy is right for you. Follow these instructions at home:  Schedule regular health, dental, and eye exams.  Stay current with your immunizations.  Do not use any tobacco products including cigarettes, chewing tobacco, or electronic cigarettes.  If you are pregnant, do not drink alcohol.  If you are breastfeeding, limit how much and how often you drink alcohol.  Limit alcohol intake to no more than 1 drink per day for nonpregnant women. One drink equals 12 ounces of beer, 5 ounces of  wine, or 1 ounces of hard liquor.  Do not use street drugs.  Do not share needles.  Ask your health care provider for help if you need support or information about quitting drugs.  Tell your health care provider if you often feel depressed.  Tell your health care provider if you have ever been abused or do not feel safe at home. This information is not intended to replace advice given to you by your health care provider. Make sure you discuss any questions you have with your health care provider. Document Released: 03/06/2011 Document Revised: 01/27/2016 Document Reviewed: 05/25/2015 Elsevier Interactive Patient Education  [employer].

## 2017-05-11 NOTE — Progress Notes (Signed)
Subjective:    Patient ID: Katherine Rosario, female    DOB: 21-Nov-1970, 46 y.o.   MRN: [id number]  HPI  Patient presents for yearly preventative medicine examination. She is a pleasant 46 year old female who  has a past medical history of Allergic rhinitis; Anemia; Asthma; Fibroids; GERD (gastroesophageal reflux disease); Heart palpitations; Hyperlipidemia; Migraines; and OSA (obstructive sleep apnea) (05/06/2014).  All immunizations and health maintenance protocols were reviewed with the patient and needed orders were placed. She is up to date on vaccinations   Appropriate screening laboratory values were ordered for the patient including screening of hyperlipidemia, renal function and hepatic function.  Medication reconciliation,  past medical history, social history, problem list and allergies were reviewed in detail with the patient  Goals were established with regard to weight loss, exercise, and  diet in compliance with medications  Migraines - she takes imitrex when needed. She was prescribed topamax but quit taking it due to side effect profile. She has not noticed any significant difference in the amount of migraines she has experienced   Hypertension - She takes HCTZ daily. Blood pressure is well controlled.   She needs refills of Omeprazole and Singular   She is up to date on pap and mammogram   Review of Systems  Constitutional: Negative.   HENT: Negative.   Eyes: Negative.   Respiratory: Negative.   Cardiovascular: Negative.   Gastrointestinal: Negative.   Endocrine: Negative.   Genitourinary: Negative.   Musculoskeletal: Negative.   Skin: Negative.   Allergic/Immunologic: Negative.   Neurological: Negative.   Hematological: Negative.   Psychiatric/Behavioral: Negative.    Past Medical History:  Diagnosis Date  . Allergic rhinitis   . Anemia    Heavy menstrual flow/OTC FE BID:    . Asthma   . Fibroids   . GERD (gastroesophageal reflux disease)   . Heart  palpitations   . Hyperlipidemia   . Migraines   . OSA (obstructive sleep apnea) 05/06/2014    Social History   Social History  . Marital status: Married    Spouse name: N/A  . Number of children: N/A  . Years of education: N/A   Occupational History  . 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 Smoking status: Never Smoker  . Smokeless tobacco: Current User    Types: Snuff  . Alcohol use 0.0 oz/week     Comment: occ  . Drug use: No  . Sexual activity: No   Other Topics Concern  . 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      Pt lives with wife, in a one story home. Has some college education.       She likes to hike and swim.     Past Surgical History:  Procedure Laterality Date  . UTERINE FIBROID EMBOLIZATION      Family History  Problem Relation Age of Onset  . Hyperlipidemia Mother   . Hypertension Mother   . Colon cancer Father 50  . Hyperlipidemia Father   . Hypertension Father   . Prostate cancer Father 49  . Heart attack Neg Hx   . Stroke Neg Hx     No Known Allergies  Current Outpatient Prescriptions on File Prior to Visit  Medication Sig Dispense Refill  . albuterol (PROVENTIL HFA;VENTOLIN HFA) 108 (90 BASE) MCG/ACT inhaler Inhale 2 puffs into the lungs every 4 (four) hours as  needed for shortness of breath.    . calcium-vitamin D (OSCAL WITH D) 500-200 MG-UNIT per tablet Take 1 tablet by mouth daily with breakfast.    . fluticasone (FLONASE) 50 MCG/ACT nasal spray USE 1 SPRAY IN EACH NOSTRILDAILY 16 g 2  . hydrochlorothiazide (HYDRODIURIL) 25 MG tablet TAKE 1 TABLET DAILY AS     NEEDED FOR LOWER EXTREMITY EDEMA 90 tablet 1  . loratadine (CLARITIN) 10 MG tablet Take 10 mg by mouth every morning.    . methylPREDNISolone (MEDROL DOSEPAK) 4 MG TBPK tablet Take as directed 21 tablet 0  . ondansetron (ZOFRAN ODT) 4 MG disintegrating tablet Take 1  tablet (4 mg total) by mouth every 8 (eight) hours as needed for nausea or vomiting. 20 tablet 2  . SUMAtriptan (IMITREX) 100 MG tablet I @ ONSET of migraine;repeat in 2 hours if needed 27 tablet 2  . vitamin E 400 UNIT capsule Take 400 Units by mouth daily.     No current facility-administered medications on file prior to visit.     BP 112/72 (BP Location: Left Arm, Patient Position: Sitting, Cuff Size: Normal)   Pulse 79   Temp 98.4 F (36.9 C) (Oral)   Ht 5\' 2"  (1.575 m)   Wt 199 lb (90.3 kg)   SpO2 97%   BMI 36.40 kg/m       Objective:   Physical Exam  Constitutional: She is oriented to person, place, and time. She appears well-developed and well-nourished. No distress.  HENT:  Head: Normocephalic and atraumatic.  Right Ear: External ear normal.  Left Ear: External ear normal.  Nose: Nose normal.  Mouth/Throat: Oropharynx is clear and moist. No oropharyngeal exudate.  Eyes: Pupils are equal, round, and reactive to light. Conjunctivae and EOM are normal. Right eye exhibits no discharge. Left eye exhibits no discharge. No scleral icterus.  Neck: Normal range of motion. Neck supple. No JVD present. Carotid bruit is not present. No tracheal deviation present. No thyroid mass and no thyromegaly present.  Cardiovascular: Normal rate, regular rhythm, normal heart sounds and intact distal pulses.  Exam reveals no gallop and no friction rub.   No murmur heard. Pulmonary/Chest: Effort normal and breath sounds normal. No respiratory distress. She has no wheezes. She has no rales. She exhibits no tenderness.  Abdominal: Soft. Bowel sounds are normal. She exhibits no distension and no mass. There is no tenderness. There is no rebound and no guarding.  Genitourinary:  Genitourinary Comments: Done by GYN   Musculoskeletal: Normal range of motion.  Lymphadenopathy:    She has no cervical adenopathy.  Neurological: She is alert and oriented to person, place, and time. She has normal  reflexes. She displays normal reflexes. No cranial nerve deficit. She exhibits normal muscle tone. Coordination normal.  Skin: Skin is warm and dry. No rash noted. She is not diaphoretic. No erythema. No pallor.  Psychiatric: She has a normal mood and affect. Her behavior is normal. Judgment and thought content normal.  Nursing note and vitals reviewed.     Assessment & Plan:  1. Routine general medical examination at a health care facility - Educated on the importance of diet and exercise  - Follow up in one year or sooner if needed - Basic metabolic panel - CBC with Differential/Platelet - Hemoglobin A1c - Hepatic function panel - Lipid panel - TSH  2. Medication refill  - montelukast (SINGULAIR) 10 MG tablet; Take 1 tablet (10 mg total) by mouth every morning.  Dispense: 90 tablet;  Refill: 3 - omeprazole (PRILOSEC) 20 MG capsule; Take 1 capsule (20 mg total) by mouth daily.  Dispense: 90 capsule; Refill: 3  Dorothyann Peng, NP

## 2017-05-14 ENCOUNTER — Encounter: Payer: Self-pay | Admitting: Adult Health

## 2017-05-25 ENCOUNTER — Encounter: Payer: Self-pay | Admitting: Adult Health

## 2017-09-21 ENCOUNTER — Ambulatory Visit (INDEPENDENT_AMBULATORY_CARE_PROVIDER_SITE_OTHER): Payer: Managed Care, Other (non HMO)

## 2017-09-21 ENCOUNTER — Encounter: Payer: Self-pay | Admitting: Physician Assistant

## 2017-09-21 ENCOUNTER — Ambulatory Visit: Payer: Managed Care, Other (non HMO) | Admitting: Physician Assistant

## 2017-09-21 ENCOUNTER — Other Ambulatory Visit: Payer: Self-pay

## 2017-09-21 VITALS — BP 133/85 | HR 100 | Temp 98.9°F | Resp 18 | Ht 62.0 in | Wt 202.2 lb

## 2017-09-21 DIAGNOSIS — J22 Unspecified acute lower respiratory infection: Secondary | ICD-10-CM

## 2017-09-21 MED ORDER — IPRATROPIUM BROMIDE 0.02 % IN SOLN
0.5000 mg | Freq: Once | RESPIRATORY_TRACT | Status: AC
  Administered 2017-09-21: 0.5 mg via RESPIRATORY_TRACT

## 2017-09-21 MED ORDER — HYDROCODONE-HOMATROPINE 5-1.5 MG/5ML PO SYRP: 2.5000 mL | ORAL_SOLUTION | Freq: Every day | ORAL | 0 refills | Status: DC

## 2017-09-21 MED ORDER — BENZONATATE 200 MG PO CAPS: 200.0000 mg | ORAL_CAPSULE | Freq: Two times a day (BID) | ORAL | 0 refills | Status: DC | PRN

## 2017-09-21 MED ORDER — DOXYCYCLINE HYCLATE 100 MG PO CAPS
100.0000 mg | ORAL_CAPSULE | Freq: Two times a day (BID) | ORAL | 0 refills | Status: AC
Start: 2017-09-21 — End: 2017-10-01

## 2017-09-21 MED ORDER — ALBUTEROL SULFATE (2.5 MG/3ML) 0.083% IN NEBU
2.5000 mg | INHALATION_SOLUTION | Freq: Once | RESPIRATORY_TRACT | Status: AC
  Administered 2017-09-21: 2.5 mg via RESPIRATORY_TRACT

## 2017-09-21 MED ORDER — HYDROCODONE-HOMATROPINE 5-1.5 MG/5ML PO SYRP: 2.5000 mL | ORAL_SOLUTION | Freq: Every day | ORAL | 0 refills | Status: AC

## 2017-09-21 NOTE — Progress Notes (Signed)
09/21/2017 9:53 AM   DOB: June 17, 1971 / MRN: [id number]  SUBJECTIVE:  Katherine Rosario is a 47 y.o. female presenting for nonproductive cough.  She has a history of asthma.  Tells me that she has been feeling bad for about a week now mostly with fatigue.  She began to cough yesterday and now notes pleuritic pain. She denies shortness of breath or leg swelling.  She feels she is getting worse.  States "I have not had problems with mild asthma for years now."   She has No Known Allergies.   She  has a past medical history of Allergic rhinitis, Anemia, Asthma, Fibroids, GERD (gastroesophageal reflux disease), Heart palpitations, Hyperlipidemia, Migraines, and OSA (obstructive sleep apnea) (05/06/2014).    She  reports that  has never smoked. Her smokeless tobacco use includes snuff. She reports that she drinks alcohol. She reports that she does not use drugs. She  reports that she does not engage in sexual activity. The patient  has a past surgical history that includes Uterine fibroid embolization.  Her family history includes Colon cancer (age of onset: 32) in her father; Hyperlipidemia in her father and mother; Hypertension in her father and mother; Prostate cancer (age of onset: 53) in her father.  Review of Systems  Constitutional: Negative for chills and fever.  Respiratory: Positive for cough and sputum production. Negative for hemoptysis, shortness of breath and wheezing.   Cardiovascular: Positive for chest pain. Negative for palpitations, orthopnea, claudication, leg swelling and PND.  Gastrointestinal: Negative for nausea.  Skin: Negative for itching and rash.  Neurological: Negative for dizziness.    The problem list and medications were reviewed and updated by myself where necessary and exist elsewhere in the encounter.   OBJECTIVE:  BP 133/85 (BP Location: Right Arm, Patient Position: Sitting, Cuff Size: Large)   Pulse 100   Temp 98.9 F (37.2 C) (Oral)   Resp 18   Ht 5\' 2"   (1.575 m)   Wt 202 lb 3.2 oz (91.7 kg)   SpO2 93%   BMI 36.98 kg/m   Wt Readings from Last 3 Encounters:  09/21/17 202 lb 3.2 oz (91.7 kg)  05/11/17 199 lb (90.3 kg)  01/09/17 198 lb 3.2 oz (89.9 kg)   Temp Readings from Last 3 Encounters:  09/21/17 98.9 F (37.2 C) (Oral)  05/11/17 98.4 F (36.9 C) (Oral)  01/09/17 97.9 F (36.6 C) (Oral)   BP Readings from Last 3 Encounters:  09/21/17 133/85  05/11/17 112/72  01/09/17 120/70   Pulse Readings from Last 3 Encounters:  09/21/17 100  05/11/17 79  01/20/16 74     Physical Exam  Constitutional: She is oriented to person, place, and time. She appears well-developed and well-nourished. No distress.  Cardiovascular: Normal rate, regular rhythm and normal heart sounds.  Pulmonary/Chest: No respiratory distress. She has no wheezes. She has no rales. She exhibits no tenderness.  Neurological: She is alert and oriented to person, place, and time.  Skin: She is not diaphoretic.   Wt Readings from Last 3 Encounters:  09/21/17 202 lb 3.2 oz (91.7 kg)  05/11/17 199 lb (90.3 kg)  01/09/17 198 lb 3.2 oz (89.9 kg)   Lab Results  Component Value Date   CREATININE 0.88 05/11/2017   BUN 11 05/11/2017   NA 138 05/11/2017   K 4.2 05/11/2017   CL 103 05/11/2017   CO2 27 05/11/2017   Lab Results  Component Value Date   ALT 31 05/11/2017   AST  25 05/11/2017   ALKPHOS 60 05/11/2017   BILITOT 0.5 05/11/2017    No results found for this or any previous visit (from the past 72 hour(s)).  Dg Chest 2 View  Result Date: 09/21/2017 CLINICAL DATA:  Cough and chest pain EXAM: CHEST  2 VIEW COMPARISON:  October 17, 2016 FINDINGS: There is no edema or consolidation. The heart size and pulmonary vascularity are normal. No adenopathy. No pneumothorax. No bone lesions. IMPRESSION: No edema or consolidation. Electronically Signed   By: Lowella Grip III M.D.   On: 09/21/2017 09:51    ASSESSMENT AND PLAN:   Katherine Rosario was seen today for  cough.  Diagnoses and all orders for this visit:  Acute lower respiratory tract infection: Well-appearing.  Nebs did not make a difference in her symptoms.  This is most likely bronchitis.  I cannot rule out a bacterial etiology despite negative chest x-ray.  Doxycycline to cover for atypical organism.  Will treat cough.  She will continue her home remedies. -     DG Chest 2 View; Future -     albuterol (PROVENTIL) (2.5 MG/3ML) 0.083% nebulizer solution 2.5 mg -     ipratropium (ATROVENT) nebulizer solution 0.5 mg    The patient is advised to call or return to clinic if she does not see an improvement in symptoms, or to seek the care of the closest emergency department if she worsens with the above plan.   Philis Fendt, MHS, PA-C [facility] 09/21/2017 9:53 AM

## 2017-09-21 NOTE — Patient Instructions (Addendum)
  Continue your medications for symptomatic relief.  Let us add Tessalon, Hycodan and doxycycline to cover for atypical lower respiratory tract infection.  If any point you develop sudden shortness of breath please go to the emergency department.   IF you received an x-ray today, you will receive an invoice from [facility]. Please contact [facility] at [phone] with questions or concerns regarding your invoice.   IF you received labwork today, you will receive an invoice from [facility]. Please contact [facility] at [phone] with questions or concerns regarding your invoice.   Our billing staff will not be able to assist you with questions regarding bills from these companies.  You will be contacted with the lab results as soon as they are available. The fastest way to get your results is to activate your My Chart account. Instructions are located on the last page of this paperwork. If you have not heard from [place] regarding the results in 2 weeks, please contact this office.

## 2017-09-25 ENCOUNTER — Telehealth: Payer: Self-pay | Admitting: Family Medicine

## 2017-09-25 ENCOUNTER — Encounter: Payer: Self-pay | Admitting: Family Medicine

## 2017-09-25 NOTE — Telephone Encounter (Signed)
She doesn't need to come in at this time, if she is not feeling any better and/or continues to have a fever then she needs to follow up. Ok to write note for two days

## 2017-09-25 NOTE — Telephone Encounter (Signed)
Should pt come to see you since still having fever?

## 2017-09-25 NOTE — Telephone Encounter (Signed)
Pt notified to pick up letter at the front desk.

## 2017-09-25 NOTE — Telephone Encounter (Signed)
Copied from [place] [id number]. Topic: Inquiry >> Sep 24, 2017  4:16 PM Vernona Rieger wrote: Was seen at [facility] on Friday, they diagnosed with upper resp infection. She is antiboitic, she said she is still running a fever. She is asking for a note for work or they are going to write her up. [facility] did write her out until today but cant go back with a fever. Please call patient back @ [phone]

## 2017-10-01 ENCOUNTER — Encounter: Payer: Self-pay | Admitting: Family Medicine

## 2017-10-01 ENCOUNTER — Ambulatory Visit: Payer: Managed Care, Other (non HMO) | Admitting: Family Medicine

## 2017-10-01 VITALS — BP 138/82 | HR 101 | Temp 98.6°F | Wt 203.6 lb

## 2017-10-01 DIAGNOSIS — J209 Acute bronchitis, unspecified: Secondary | ICD-10-CM | POA: Diagnosis not present

## 2017-10-01 MED ORDER — HYDROCODONE-HOMATROPINE 5-1.5 MG/5ML PO SYRP: 5.0000 mL | ORAL_SOLUTION | ORAL | 0 refills | Status: DC | PRN

## 2017-10-01 MED ORDER — AZITHROMYCIN 250 MG PO TABS: ORAL_TABLET | ORAL | 0 refills | Status: DC

## 2017-10-01 NOTE — Progress Notes (Signed)
   Subjective:    Patient ID: Katherine Rosario, female    DOB: 19-Dec-1970, 47 y.o.   MRN: [id number]  HPI Here for ongoing stuffy head, PND, and dry cough. Low grade fevers. She saw urgent care on 09-21-17 and had a normal CXR. She was given a nebulizer treatment and was started on Doxycycline. She did not improve so she saw a nurse at her work clinic last week and was given 5 days of Bactrim. She is still no better.    Review of Systems  Constitutional: Positive for fever.  HENT: Positive for congestion, postnasal drip and sinus pressure. Negative for sinus pain and sore throat.   Eyes: Negative.   Respiratory: Positive for cough.        Objective:   Physical Exam  Constitutional: She appears well-developed and well-nourished.  HENT:  Right Ear: External ear normal.  Left Ear: External ear normal.  Nose: Nose normal.  Mouth/Throat: Oropharynx is clear and moist.  Eyes: Conjunctivae are normal.  Neck: No thyromegaly present.  Cardiovascular: Normal rate, regular rhythm, normal heart sounds and intact distal pulses.  Pulmonary/Chest: Effort normal and breath sounds normal. No respiratory distress. She has no wheezes. She has no rales.  Lymphadenopathy:    She has no cervical adenopathy.          Assessment & Plan:  Bronchitis, treat with a Zpack. Written out of work from 09-20-17 until 10-03-17.  Alysia Penna, MD

## 2017-11-13 DIAGNOSIS — N134 Hydroureter: Secondary | ICD-10-CM | POA: Insufficient documentation

## 2017-11-13 DIAGNOSIS — Z79899 Other long term (current) drug therapy: Secondary | ICD-10-CM | POA: Diagnosis not present

## 2017-11-13 DIAGNOSIS — F17290 Nicotine dependence, other tobacco product, uncomplicated: Secondary | ICD-10-CM | POA: Insufficient documentation

## 2017-11-13 DIAGNOSIS — N130 Hydronephrosis with ureteropelvic junction obstruction: Secondary | ICD-10-CM | POA: Insufficient documentation

## 2017-11-13 DIAGNOSIS — N201 Calculus of ureter: Secondary | ICD-10-CM | POA: Insufficient documentation

## 2017-11-13 DIAGNOSIS — R1031 Right lower quadrant pain: Secondary | ICD-10-CM | POA: Diagnosis present

## 2017-11-14 ENCOUNTER — Emergency Department (HOSPITAL_COMMUNITY): Payer: Managed Care, Other (non HMO)

## 2017-11-14 ENCOUNTER — Encounter (HOSPITAL_COMMUNITY): Payer: Self-pay

## 2017-11-14 ENCOUNTER — Other Ambulatory Visit: Payer: Self-pay

## 2017-11-14 ENCOUNTER — Emergency Department (HOSPITAL_COMMUNITY)
Admission: EM | Admit: 2017-11-14 | Discharge: 2017-11-14 | Disposition: A | Discharge status: Home / Self Care | Payer: Managed Care, Other (non HMO) | Attending: Emergency Medicine | Admitting: Emergency Medicine

## 2017-11-14 DIAGNOSIS — N201 Calculus of ureter: Secondary | ICD-10-CM

## 2017-11-14 LAB — I-STAT BETA HCG BLOOD, ED (MC, WL, AP ONLY): I-stat hCG, quantitative: 7.2 m[IU]/mL — ABNORMAL HIGH (ref <5)

## 2017-11-14 LAB — URINALYSIS, ROUTINE W REFLEX MICROSCOPIC
Bilirubin Urine: NEGATIVE
GLUCOSE, UA: NEGATIVE mg/dL
KETONES UR: NEGATIVE mg/dL
Nitrite: NEGATIVE
PROTEIN: NEGATIVE mg/dL
Specific Gravity, Urine: 1.011 (ref 1.005–1.030)
pH: 6 (ref 5.0–8.0)

## 2017-11-14 LAB — COMPREHENSIVE METABOLIC PANEL
ALBUMIN: 4.2 g/dL (ref 3.5–5.0)
ALT: 39 U/L (ref 14–54)
AST: 28 U/L (ref 15–41)
Alkaline Phosphatase: 76 U/L (ref 38–126)
Anion gap: 9 (ref 5–15)
BUN: 18 mg/dL (ref 6–20)
CALCIUM: 9.4 mg/dL (ref 8.9–10.3)
CHLORIDE: 107 mmol/L (ref 101–111)
CO2: 25 mmol/L (ref 22–32)
CREATININE: 0.93 mg/dL (ref 0.44–1.00)
GFR calc Af Amer: >60 mL/min (ref >60)
GFR calc non Af Amer: >60 mL/min (ref >60)
GLUCOSE: 95 mg/dL (ref 65–99)
Potassium: 4 mmol/L (ref 3.5–5.1)
SODIUM: 141 mmol/L (ref 135–145)
Total Bilirubin: 0.2 mg/dL — ABNORMAL LOW (ref 0.3–1.2)
Total Protein: 7.2 g/dL (ref 6.5–8.1)

## 2017-11-14 LAB — LIPASE, BLOOD: Lipase: 34 U/L (ref 11–51)

## 2017-11-14 LAB — CBC
HCT: 41.8 % (ref 36.0–46.0)
Hemoglobin: 13.9 g/dL (ref 12.0–15.0)
MCH: 30.8 pg (ref 26.0–34.0)
MCHC: 33.3 g/dL (ref 30.0–36.0)
MCV: 92.5 fL (ref 78.0–100.0)
Platelets: 358 10*3/uL (ref 150–400)
RBC: 4.52 MIL/uL (ref 3.87–5.11)
RDW: 13.2 % (ref 11.5–15.5)
WBC: 9.7 10*3/uL (ref 4.0–10.5)

## 2017-11-14 LAB — POC URINE PREG, ED: PREG TEST UR: NEGATIVE

## 2017-11-14 LAB — PREGNANCY, URINE: PREG TEST UR: NEGATIVE

## 2017-11-14 MED ORDER — TAMSULOSIN HCL 0.4 MG PO CAPS: 0.4000 mg | ORAL_CAPSULE | Freq: Every day | ORAL | 0 refills | Status: DC

## 2017-11-14 MED ORDER — ONDANSETRON HCL 4 MG/2ML IJ SOLN
4.0000 mg | Freq: Once | INTRAMUSCULAR | Status: AC
  Administered 2017-11-14: 4 mg via INTRAVENOUS
  Filled 2017-11-14: qty 2

## 2017-11-14 MED ORDER — MORPHINE SULFATE (PF) 4 MG/ML IV SOLN
4.0000 mg | Freq: Once | INTRAVENOUS | Status: AC
  Administered 2017-11-14: 4 mg via INTRAVENOUS
  Filled 2017-11-14: qty 1

## 2017-11-14 MED ORDER — SODIUM CHLORIDE 0.9 % IV BOLUS (SEPSIS)
1000.0000 mL | Freq: Once | INTRAVENOUS | Status: AC
  Administered 2017-11-14: 1000 mL via INTRAVENOUS

## 2017-11-14 MED ORDER — HYDROCODONE-ACETAMINOPHEN 5-325 MG PO TABS: 2.0000 | ORAL_TABLET | ORAL | 0 refills | Status: DC | PRN

## 2017-11-14 MED ORDER — ONDANSETRON 4 MG PO TBDP: 4.0000 mg | ORAL_TABLET | Freq: Three times a day (TID) | ORAL | 0 refills | Status: DC | PRN

## 2017-11-14 MED ORDER — KETOROLAC TROMETHAMINE 30 MG/ML IJ SOLN
15.0000 mg | Freq: Once | INTRAMUSCULAR | Status: AC
  Administered 2017-11-14: 15 mg via INTRAVENOUS
  Filled 2017-11-14: qty 1

## 2017-11-14 NOTE — ED Provider Notes (Signed)
[facility] DEPT Provider Note   CSN: [id number] Arrival date & time: 11/13/17  2331     History   Chief Complaint No chief complaint on file.   HPI Katherine Rosario is a 47 y.o. female.  HPI 47 year old Caucasian female with no significant past medical history presents to the emergency department today for evaluation of right lower quadrant abdominal pain and right flank pain.  Patient states that it started this evening at approximately 1930 before eating dinner.  Patient states that the pain was located in the right flank and has radiated to her right lower quadrant.  States the pain is dull and constant however the intensity is intermittent.  Describes as a sharp cramping pain.  She does report urinary urgency and frequency and pressure with urination.  Patient also states that she has had 4-5 episodes of nonbloody bilious emesis after eating this evening due to the pain.  She did not take anything for the pain prior to arrival.  Nothing makes better or worse.  Denies any associated fevers, vaginal symptoms.  Patient denies any history of kidney stones, abdominal surgeries.  Pt denies any fever, chill, ha, vision changes, lightheadedness, dizziness, congestion, neck pain, cp, sob, cough,  melena, hematochezia, lower extremity paresthesias.  Past Medical History:  Diagnosis Date  . Allergic rhinitis   . Anemia    Heavy menstrual flow/OTC FE BID:    . Asthma   . Fibroids   . GERD (gastroesophageal reflux disease)   . Heart palpitations   . Hyperlipidemia   . Migraines   . OSA (obstructive sleep apnea) 05/06/2014    Patient Active Problem List   Diagnosis Date Noted  . Bilateral edema of lower extremity 09/01/2015  . Migraine with aura and without status migrainosus, not intractable 07/12/2015  . Migraine 04/27/2015  . Palpitations 06/23/2014  . Abnormal ECG 06/23/2014  . Syncope 06/23/2014  . Orthostatic hypotension 06/23/2014  . OSA  (obstructive sleep apnea) 05/06/2014  . Other malaise and fatigue 05/02/2014  . Nonspecific elevation of levels of transaminase or lactic acid dehydrogenase (LDH) 05/02/2014  . Acute upper respiratory infections of unspecified site 11/19/2013  . Obese 08/25/2013  . Allergic rhinitis 08/25/2013  . Anemia 08/23/2012  . Heart palpitations   . Asthma   . GERD (gastroesophageal reflux disease)   . Hyperlipidemia     Past Surgical History:  Procedure Laterality Date  . UTERINE FIBROID EMBOLIZATION      OB History    No data available       Home Medications    Prior to Admission medications   Medication Sig Start Date End Date Taking? Authorizing Provider  albuterol (PROVENTIL HFA;VENTOLIN HFA) 108 (90 BASE) MCG/ACT inhaler Inhale 2 puffs into the lungs every 4 (four) hours as needed for shortness of breath.   Yes [provider]  calcium-vitamin D (OSCAL WITH D) 500-200 MG-UNIT per tablet Take 1 tablet by mouth daily with breakfast.   Yes [provider]  fluticasone (FLONASE) 50 MCG/ACT nasal spray USE 1 SPRAY IN EACH NOSTRILDAILY 02/11/14  Yes Rowe Clack, MD  hydrochlorothiazide (HYDRODIURIL) 25 MG tablet TAKE 1 TABLET DAILY AS     NEEDED FOR LOWER EXTREMITY EDEMA 12/08/15  Yes Dorothy Spark, MD  loratadine (CLARITIN) 10 MG tablet Take 10 mg by mouth every morning.   Yes [provider]  montelukast (SINGULAIR) 10 MG tablet Take 1 tablet (10 mg total) by mouth every morning. 05/11/17  Yes Nafziger, Tommi Rumps,  NP  omeprazole (PRILOSEC) 20 MG capsule Take 1 capsule (20 mg total) by mouth daily. 05/11/17  Yes Nafziger, Tommi Rumps, NP  SUMAtriptan (IMITREX) 100 MG tablet I @ ONSET of migraine;repeat in 2 hours if needed 01/20/16  Yes Jaffe, Adam R, DO  vitamin E 400 UNIT capsule Take 400 Units by mouth daily.   Yes [provider]  azithromycin (ZITHROMAX) 250 MG tablet As directed Patient not taking: Reported on 11/14/2017 10/01/17   Laurey Morale, MD    benzonatate (TESSALON) 200 MG capsule Take 1 capsule (200 mg total) by mouth 2 (two) times daily as needed for cough. Patient not taking: Reported on 11/14/2017 09/21/17   Tereasa Coop, PA-C  HYDROcodone-homatropine (HYDROMET) 5-1.5 MG/5ML syrup Take 5 mLs by mouth every 4 (four) hours as needed. Patient not taking: Reported on 11/14/2017 10/01/17   Laurey Morale, MD  ondansetron (ZOFRAN ODT) 4 MG disintegrating tablet Take 1 tablet (4 mg total) by mouth every 8 (eight) hours as needed for nausea or vomiting. Patient not taking: Reported on 11/14/2017 01/20/15   Dorothyann Peng, NP    Family History Family History  Problem Relation Age of Onset  . Hyperlipidemia Mother   . Hypertension Mother   . Colon cancer Father 47  . Hyperlipidemia Father   . Hypertension Father   . Prostate cancer Father 51  . Heart attack Neg Hx   . Stroke Neg Hx     Social History Social History   Tobacco Use  . Smoking status: Never Smoker  . Smokeless tobacco: Current User    Types: Snuff  Substance Use Topics  . Alcohol use: Yes    Alcohol/week: 0.0 oz    Comment: occ  . Drug use: No     Allergies   Patient has no known allergies.   Review of Systems Review of Systems  All other systems reviewed and are negative.    Physical Exam Updated Vital Signs BP 120/80 (BP Location: Left Arm)   Pulse 60   Temp 98.5 F (36.9 C) (Oral)   Resp 16   Ht 5\' 2"  (1.575 m)   Wt 90.7 kg (200 lb)   SpO2 97%   BMI 36.58 kg/m   Physical Exam  Constitutional: She is oriented to person, place, and time. She appears well-developed and well-nourished.  Non-toxic appearance. No distress.  HENT:  Head: Normocephalic and atraumatic.  Nose: Nose normal.  Mouth/Throat: Oropharynx is clear and moist.  Eyes: Conjunctivae are normal. Pupils are equal, round, and reactive to light. Right eye exhibits no discharge. Left eye exhibits no discharge.  Neck: Normal range of motion. Neck supple.  Cardiovascular:  Normal rate, regular rhythm, normal heart sounds and intact distal pulses. Exam reveals no gallop and no friction rub.  No murmur heard. Pulmonary/Chest: Effort normal and breath sounds normal. No stridor. No respiratory distress. She has no wheezes. She has no rales. She exhibits no tenderness.  Abdominal: Soft. Bowel sounds are normal. There is tenderness in the right lower quadrant. There is CVA tenderness (right sided). There is no rigidity, no rebound, no guarding, no tenderness at McBurney's point and negative Murphy's sign.  Musculoskeletal: Normal range of motion. She exhibits no tenderness.  Lymphadenopathy:    She has no cervical adenopathy.  Neurological: She is alert and oriented to person, place, and time.  Skin: Skin is warm and dry. Capillary refill takes less than 2 seconds.  Psychiatric: Her behavior is normal. Judgment and thought content normal.  Nursing note and vitals reviewed.    ED Treatments / Results  Labs (all labs ordered are listed, but only abnormal results are displayed) Labs Reviewed  COMPREHENSIVE METABOLIC PANEL - Abnormal; Notable for the following components:      Result Value   Total Bilirubin 0.2 (*)    All other components within normal limits  URINALYSIS, ROUTINE W REFLEX MICROSCOPIC - Abnormal; Notable for the following components:   Hgb urine dipstick MODERATE (*)    Leukocytes, UA TRACE (*)    Bacteria, UA RARE (*)    Squamous Epithelial / LPF 0-5 (*)    All other components within normal limits  I-STAT BETA HCG BLOOD, ED ([facility], [facility], [facility] ONLY) - Abnormal; Notable for the following components:   I-stat hCG, quantitative 7.2 (*)    All other components within normal limits  LIPASE, BLOOD  CBC  PREGNANCY, URINE  POC URINE PREG, ED    EKG  EKG Interpretation None       Radiology Ct Renal Stone Study  Result Date: 11/14/2017 CLINICAL DATA:  Right flank pain EXAM: CT ABDOMEN AND PELVIS WITHOUT CONTRAST TECHNIQUE: Multidetector CT imaging  of the abdomen and pelvis was performed following the standard protocol without IV contrast. COMPARISON:  10/26/2014 FINDINGS: Lower chest: No basilar pulmonary nodules or pleural effusion. No apical pericardial effusion. Hepatobiliary: Normal hepatic contours and density. No visible biliary dilatation. Normal gallbladder. Pancreas: Normal parenchymal contours without ductal dilatation. No peripancreatic fluid collection. Spleen: Normal. Adrenals/Urinary Tract: --Adrenal glands: Normal. --Right kidney/ureter: Mild hydroureteronephrosis secondary to 4 x 3 mm stone at the right ureterovesical junction. There is 1 other left upper pole renal stone measuring 3 mm. No perinephric stranding. --Left kidney/ureter: No hydronephrosis, perinephric stranding or nephrolithiasis. No obstructing ureteral stones. --Urinary bladder: Normal appearance for the degree of distention. Stomach/Bowel: --Stomach/Duodenum: No hiatal hernia or other gastric abnormality. Normal duodenal course. --Small bowel: No dilatation or inflammation. --Colon: No focal abnormality. --Appendix: Normal. Vascular/Lymphatic: Normal course and caliber of the major abdominal vessels. No abdominal or pelvic lymphadenopathy. Reproductive: Multiple partially calcified uterine fibroids. 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 Musculoskeletal. No bony spinal canal stenosis or focal osseous abnormality. Other: None. IMPRESSION: 1. Right-sided obstructive uropathy with 4 x 3 mm stone at the right ureterovesical junction, causing mild right hydroureteronephrosis. 2. One nonobstructive right upper pole renal stone. 3. Unchanged appearance of calcified uterine fibroids and right ovarian dermoid. Electronically Signed   By: Ulyses Jarred M.D.   On: 11/14/2017 03:13    Procedures Procedures (including critical care time)  Medications Ordered in ED Medications  sodium chloride 0.9 % bolus 1,000 mL (1,000 mLs Intravenous New Bag/Given 11/14/17 1610)    ketorolac (TORADOL) 30 MG/ML injection 15 mg (15 mg Intravenous Given 11/14/17 0626)  ondansetron (ZOFRAN) injection 4 mg (4 mg Intravenous Given 11/14/17 0626)  morphine 4 MG/ML injection 4 mg (4 mg Intravenous Given 11/14/17 9604)     Initial Impression / Assessment and Plan / ED Course  I have reviewed the triage vital signs and the nursing notes.  Pertinent labs & imaging results that were available during my care of the patient were reviewed by me and considered in my medical decision making (see chart for details).     Pt has been diagnosed with a Kidney Stone via CT. There is no evidence of significant hydronephrosis, serum creatine WNL, vitals sign stable and the pt does not have irratractable vomiting. Pt will be dc home with pain medications & has  been advised to follow up with PCP.  Pt is hemodynamically stable, in NAD, & able to ambulate in the ED. Evaluation does not show pathology that would require ongoing emergent intervention or inpatient treatment. I explained the diagnosis to the patient. Pain has been managed & has no complaints prior to dc. Pt is comfortable with above plan and is stable for discharge at this time. All questions were answered prior to disposition. Strict return precautions for f/u to the ED were discussed. Encouraged follow up with PCP.     Final Clinical Impressions(s) / ED Diagnoses   Final diagnoses:  Ureterolithiasis    ED Discharge Orders        Ordered    HYDROcodone-acetaminophen (NORCO/VICODIN) 5-325 MG tablet  Every 4 hours PRN     11/14/17 0645    ondansetron (ZOFRAN ODT) 4 MG disintegrating tablet  Every 8 hours PRN     11/14/17 0645    tamsulosin (FLOMAX) 0.4 MG CAPS capsule  Daily after breakfast     11/14/17 0645       Doristine Devoid, PA-C 11/14/17 0647    Palumbo, April, MD 11/14/17 [id number]

## 2017-11-14 NOTE — ED Provider Notes (Signed)
Care assumed from previous provider PA [place]. Please see note for further details. Case discussed, plan agreed upon. Will follow up on patient following medication administration.  If tolerating p.o. and pain controlled, can be discharged home.   Patient reevaluated.  Pain improved.  She is tolerating p.o. without difficulty and feels comfortable with discharge.  Reiterated return precautions and follow-up care.  All questions answered.   Kimo Bancroft, Ozella Almond, PA-C 11/14/17 [id number]    Macarthur Critchley, MD 11/14/17 [phone]

## 2017-11-14 NOTE — ED Notes (Signed)
Patient to CT.

## 2017-11-14 NOTE — ED Triage Notes (Signed)
Patient states she developed right lower back pain and RLQ pain at 1930 and states that she ate dinner, a frozen "Smart ones" dinner". Patient states she started vomiting at 2130. Patient states she has vomited 4-5 times. Denies any diarrhea-did have 2 soft BM's this evening which she states is not normal for her. Patient states "I have to pee constantly".

## 2017-11-14 NOTE — Discharge Instructions (Signed)
You have been diagnosed with kidney stones.  Drink plenty of fluids to help you pass the stone.  Take  ibuprofen / naproxen as directed with food for mild to moderate pain. Use your pain medication as directed and only as needed for severe pain. Taking flomax as directed will also help to pass the stone. Use Zofran for nausea as directed.  Follow up with the urology clinic listed in regards to your hospital visit.   Return to the ED immediately if you develop fever that persists > 101, uncontrolled pain or vomiting, or other concerns.   Do not drink alcohol, drive or participate in any other potentially dangerous activities while taking opiate pain medication as it may make you sleepy. Do not take this medication with any other sedating medications, either prescription or over-the-counter. If you were prescribed Percocet or Vicodin, do not take these with acetaminophen (Tylenol) as it is already contained within these medications.   This medication is an opiate (or narcotic) pain medication and can be habit forming.  Use it as little as possible to achieve adequate pain control.  Do not use or use it with extreme caution if you have a history of opiate abuse or dependence. This medication is intended for your use only - do not give any to anyone else and keep it in a secure place where nobody else, especially children, have access to it. It will also cause or worsen constipation, so you may want to consider taking an over-the-counter stool softener while you are taking this medication.  

## 2017-11-29 ENCOUNTER — Other Ambulatory Visit: Payer: Self-pay | Admitting: Obstetrics and Gynecology

## 2017-11-29 DIAGNOSIS — Z139 Encounter for screening, unspecified: Secondary | ICD-10-CM

## 2018-01-01 ENCOUNTER — Ambulatory Visit: Payer: Self-pay

## 2018-01-08 ENCOUNTER — Ambulatory Visit
Admission: RE | Admit: 2018-01-08 | Discharge: 2018-01-08 | Disposition: A | Discharge status: Home / Self Care | Payer: Managed Care, Other (non HMO) | Source: Ambulatory Visit | Attending: Obstetrics and Gynecology | Admitting: Obstetrics and Gynecology

## 2018-01-08 DIAGNOSIS — Z139 Encounter for screening, unspecified: Secondary | ICD-10-CM

## 2018-03-27 ENCOUNTER — Ambulatory Visit (INDEPENDENT_AMBULATORY_CARE_PROVIDER_SITE_OTHER): Payer: 59 | Admitting: Family Medicine

## 2018-03-27 ENCOUNTER — Encounter: Payer: Self-pay | Admitting: Family Medicine

## 2018-03-27 VITALS — BP 128/86 | HR 74 | Temp 98.5°F | Wt 207.0 lb

## 2018-03-27 DIAGNOSIS — G43009 Migraine without aura, not intractable, without status migrainosus: Secondary | ICD-10-CM

## 2018-03-27 MED ORDER — PROMETHAZINE HCL 25 MG/ML IJ SOLN
25.0000 mg | Freq: Once | INTRAMUSCULAR | Status: AC
  Administered 2018-03-27: 25 mg via INTRAMUSCULAR

## 2018-03-27 MED ORDER — KETOROLAC TROMETHAMINE 60 MG/2ML IM SOLN
60.0000 mg | Freq: Once | INTRAMUSCULAR | Status: AC
  Administered 2018-03-27: 60 mg via INTRAMUSCULAR

## 2018-03-27 NOTE — Progress Notes (Signed)
Subjective:    Patient ID: Katherine Rosario, female    DOB: 09-Apr-1971, 47 y.o.   MRN: [id number]  No chief complaint on file. Pt is accompanied by her wife.  HPI Pt, typically seen by Dorothyann Peng, NP,  was seen today for acute concern.  Pt endorses migraine headache which started yesterday.  Pt tried sumatriptan with no relief.  Pain is in the right frontal area of her head and behind her right eye.  Pt endorses nausea, light sensitivity, noise sensitivity.  Past Medical History:  Diagnosis Date  . Allergic rhinitis   . Anemia    Heavy menstrual flow/OTC FE BID:    . Asthma   . Fibroids   . GERD (gastroesophageal reflux disease)   . Heart palpitations   . Hyperlipidemia   . Migraines   . OSA (obstructive sleep apnea) 05/06/2014    No Known Allergies  ROS General: Denies fever, chills, night sweats, changes in weight, changes in appetite HEENT: Denies ear pain, changes in vision, rhinorrhea, sore throat  + migraine, light and sound sensitivity CV: Denies CP, palpitations, SOB, orthopnea Pulm: Denies SOB, cough, wheezing GI: Denies abdominal pain, vomiting, diarrhea, constipation  + nausea GU: Denies dysuria, hematuria, frequency, vaginal discharge Msk: Denies muscle cramps, joint pains Neuro: Denies weakness, numbness, tingling Skin: Denies rashes, bruising Psych: Denies depression, anxiety, hallucinations    Objective:    Blood pressure 128/86, pulse 74, temperature 98.5 F (36.9 C), temperature source Oral, weight 207 lb (93.9 kg), SpO2 98 %.   Gen. Pleasant, well-nourished, in no distress, normal affect  HEENT: [place]/AT, face symmetric, conjunctiva clear, no scleral icterus, PERRLA, EOMI, no nystagmus, nares patent without drainage Lungs: no accessory muscle use Cardiovascular: RRR Neuro:  A&Ox3, CN II-XII intact, normal gait   Wt Readings from Last 3 Encounters:  03/27/18 207 lb (93.9 kg)  11/14/17 200 lb (90.7 kg)  10/01/17 203 lb 9.6 oz (92.4 kg)    Lab  Results  Component Value Date   WBC 9.7 11/14/2017   HGB 13.9 11/14/2017   HCT 41.8 11/14/2017   PLT 358 11/14/2017   GLUCOSE 95 11/14/2017   CHOL 217 (H) 05/11/2017   TRIG 106.0 05/11/2017   HDL 44.60 05/11/2017   LDLDIRECT 150.8 08/25/2013   LDLCALC 151 (H) 05/11/2017   ALT 39 11/14/2017   AST 28 11/14/2017   NA 141 11/14/2017   K 4.0 11/14/2017   CL 107 11/14/2017   CREATININE 0.93 11/14/2017   BUN 18 11/14/2017   CO2 25 11/14/2017   TSH 2.72 05/11/2017   INR 0.97 02/07/2013   HGBA1C 5.5 05/11/2017    Assessment/Plan:  Migraine without aura and without status migrainosus, not intractable  -Given handout - Plan: ketorolac (TORADOL) injection 60 mg, promethazine (PHENERGAN) injection 25 mg -Given RTC or ED precautions -Patient also given a note for work  Follow-up PRN  Grier Mitts, MD

## 2018-03-27 NOTE — Patient Instructions (Signed)
Migraine Headache A migraine headache is an intense, throbbing pain on one side or both sides of the head. Migraines may also cause other symptoms, such as nausea, vomiting, and sensitivity to light and noise. What are the causes? Doing or taking certain things may also trigger migraines, such as:  Alcohol.  Smoking.  Medicines, such as: ? Medicine used to treat chest pain (nitroglycerine). ? Birth control pills. ? Estrogen pills. ? Certain blood pressure medicines.  Aged cheeses, chocolate, or caffeine.  Foods or drinks that contain nitrates, glutamate, aspartame, or tyramine.  Physical activity.  Other things that may trigger a migraine include:  Menstruation.  Pregnancy.  Hunger.  Stress, lack of sleep, too much sleep, or fatigue.  Weather changes.  What increases the risk? The following factors may make you more likely to experience migraine headaches:  Age. Risk increases with age.  Family history of migraine headaches.  Being Caucasian.  Depression and anxiety.  Obesity.  Being a woman.  Having a hole in the heart (patent foramen ovale) or other heart problems.  What are the signs or symptoms? The main symptom of this condition is pulsating or throbbing pain. Pain may:  Happen in any area of the head, such as on one side or both sides.  Interfere with daily activities.  Get worse with physical activity.  Get worse with exposure to bright lights or loud noises.  Other symptoms may include:  Nausea.  Vomiting.  Dizziness.  General sensitivity to bright lights, loud noises, or smells.  Before you get a migraine, you may get warning signs that a migraine is developing (aura). An aura may include:  Seeing flashing lights or having blind spots.  Seeing bright spots, halos, or zigzag lines.  Having tunnel vision or blurred vision.  Having numbness or a tingling feeling.  Having trouble talking.  Having muscle weakness.  How is this  diagnosed? A migraine headache can be diagnosed based on:  Your symptoms.  A physical exam.  Tests, such as CT scan or MRI of the head. These imaging tests can help rule out other causes of headaches.  Taking fluid from the spine (lumbar puncture) and analyzing it (cerebrospinal fluid analysis, or CSF analysis).  How is this treated? A migraine headache is usually treated with medicines that:  Relieve pain.  Relieve nausea.  Prevent migraines from coming back.  Treatment may also include:  Acupuncture.  Lifestyle changes like avoiding foods that trigger migraines.  Follow these instructions at home: Medicines  Take over-the-counter and prescription medicines only as told by your health care provider.  Do not drive or use heavy machinery while taking prescription pain medicine.  To prevent or treat constipation while you are taking prescription pain medicine, your health care provider may recommend that you: ? Drink enough fluid to keep your urine clear or pale yellow. ? Take over-the-counter or prescription medicines. ? Eat foods that are high in fiber, such as fresh fruits and vegetables, whole grains, and beans. ? Limit foods that are high in fat and processed sugars, such as fried and sweet foods. Lifestyle  Avoid alcohol use.  Do not use any products that contain nicotine or tobacco, such as cigarettes and e-cigarettes. If you need help quitting, ask your health care provider.  Get at least 8 hours of sleep every night.  Limit your stress. General instructions   Keep a journal to find out what may trigger your migraine headaches. For example, write down: ? What you eat and   drink. ? How much sleep you get. ? Any change to your diet or medicines.  If you have a migraine: ? Avoid things that make your symptoms worse, such as bright lights. ? It may help to lie down in a dark, quiet room. ? Do not drive or use heavy machinery. ? Ask your health care provider  what activities are safe for you while you are experiencing symptoms.  Keep all follow-up visits as told by your health care provider. This is important. Contact a health care provider if:  You develop symptoms that are different or more severe than your usual migraine symptoms. Get help right away if:  Your migraine becomes severe.  You have a fever.  You have a stiff neck.  You have vision loss.  Your muscles feel weak or like you cannot control them.  You start to lose your balance often.  You develop trouble walking.  You faint. This information is not intended to replace advice given to you by your health care provider. Make sure you discuss any questions you have with your health care provider. Document Released: 08/21/2005 Document Revised: 03/10/2016 Document Reviewed: 02/07/2016 [employer] Interactive Patient Education  2017 [employer]   

## 2018-04-23 NOTE — Progress Notes (Signed)
[facility] Note  04/24/2018     CHIEF COMPLAINT Patient presents for Retina Evaluation   HISTORY OF PRESENT ILLNESS: Katherine Rosario is a 47 y.o. female who presents to the clinic today for:   HPI    Retina Evaluation    In both eyes.  This started 1 year ago.  Associated Symptoms Floaters and Photophobia.  Negative for Flashes, Blind Spot, Scalp Tenderness, Pain, Glare, Distortion, Redness, Trauma, Shoulder/Hip pain, Jaw Claudication, Fever, Weight Loss and Fatigue.  Context:  distance vision, mid-range vision, near vision, computer work, driving and night driving.  Treatments tried include no treatments.  I, the attending physician,  performed the HPI with the patient and updated documentation appropriately.          Comments    Referral of Dr. Wonda Horner for retina eval. Patient states she has had sun light sensitivity for years , for the last year she has had occasional  floaters OD in the am after taking a shower and in the past six months she has felt like a film is covering her OD. Denies flashes and ocular pain. Pt reports she has Hx of Migraines, she takes Imitrex PRN. Denies gtt's.       Last edited by Bernarda Caffey, MD on 04/24/2018  2:27 PM. (History)    Pt states she moved here from [facility]; Pt states she was seen by Dr. Parke Simmers for a routine exam; Pt states when he was examining her he "saw a couple spots that needed a second opinion"; Pt denies seeing any floaters or flashes; Pt denies any child hood eye issues;   Referring physician: Martinique DeMarco [address] [place], [place] [address] [phone]  HISTORICAL INFORMATION:   Selected notes from the MEDICAL RECORD NUMBER Referred by Dr. Martinique DeMarco for concern of lattice degeneration with holes LEE: 08.15.19 (J. DeMarco) [BCVA: OD: 20/20 OS: 20/20] Ocular Hx- PMH-    CURRENT MEDICATIONS: Current Outpatient Medications (Ophthalmic Drugs)  Medication Sig  . prednisoLONE acetate  (PRED FORTE) 1 % ophthalmic suspension Place 1 drop into the left eye 4 (four) times daily for 7 days.   No current facility-administered medications for this visit.  (Ophthalmic Drugs)   Current Outpatient Medications (Other)  Medication Sig  . albuterol (PROVENTIL HFA;VENTOLIN HFA) 108 (90 BASE) MCG/ACT inhaler Inhale 2 puffs into the lungs every 4 (four) hours as needed for shortness of breath.  Marland Kitchen omeprazole (PRILOSEC) 20 MG capsule Take 1 capsule (20 mg total) by mouth daily.  . ondansetron (ZOFRAN ODT) 4 MG disintegrating tablet Take 1 tablet (4 mg total) by mouth every 8 (eight) hours as needed for nausea or vomiting.  . SUMAtriptan (IMITREX) 100 MG tablet I @ ONSET of migraine;repeat in 2 hours if needed   No current facility-administered medications for this visit.  (Other)      REVIEW OF SYSTEMS: ROS    Positive for: Eyes   Negative for: Constitutional, Gastrointestinal, Neurological, Skin, Genitourinary, Musculoskeletal, HENT, Endocrine, Cardiovascular, Respiratory, Psychiatric, Allergic/Imm, Heme/Lymph   Last edited by Zenovia Jordan, LPN on 3/81/0175  1:02 PM. (History)       ALLERGIES No Known Allergies  PAST MEDICAL HISTORY Past Medical History:  Diagnosis Date  . Allergic rhinitis   . Anemia    Heavy menstrual flow/OTC FE BID:    . Asthma   . Fibroids   . GERD (gastroesophageal reflux disease)   . Heart palpitations   . Hyperlipidemia   .  Migraines   . OSA (obstructive sleep apnea) 05/06/2014   Past Surgical History:  Procedure Laterality Date  . UTERINE FIBROID EMBOLIZATION      FAMILY HISTORY Family History  Problem Relation Age of Onset  . Hyperlipidemia Mother   . Hypertension Mother   . Colon cancer Father 4  . Hyperlipidemia Father   . Hypertension Father   . Prostate cancer Father 34  . Heart attack Neg Hx   . Stroke Neg Hx     SOCIAL HISTORY Social History   Tobacco Use  . Smoking status: Never Smoker  . Smokeless tobacco:  Current User    Types: Snuff  Substance Use Topics  . Alcohol use: Yes    Alcohol/week: 0.0 standard drinks    Comment: occ  . Drug use: No         OPHTHALMIC EXAM:  Base Eye Exam    Visual Acuity (Snellen - Linear)      Right Left   Dist cc 20/100 20/20   Dist ph cc 20/40 -2 NI   Correction:  Contacts       Tonometry (Tonopen, 1:45 PM)      Right Left   Pressure 16 14       Pupils      Dark Light Shape React APD   Right 4 3 Round Brisk None   Left 4 3 Round Brisk None       Visual Fields (Counting fingers)      Left Right    Full        Extraocular Movement      Right Left    Full, Ortho Full, Ortho       Neuro/Psych    Oriented x3:  Yes   Mood/Affect:  Normal       Dilation    Both eyes:  1.0% Mydriacyl, 2.5% Phenylephrine @ 1:45 PM        Slit Lamp and Fundus Exam    Slit Lamp Exam      Right Left   Lids/Lashes Dermatochalasis - upper lid, Telangiectasia, Meibomian gland dysfunction Dermatochalasis - upper lid, Telangiectasia, Meibomian gland dysfunction   Conjunctiva/Sclera White and quiet White and quiet   Cornea Clear Clear   Anterior Chamber Deep and quiet Deep and quiet   Iris Round and dilated Round and dilated   Lens 1+ Nuclear sclerosis, 1+ Cortical cataract 1+ Nuclear sclerosis, 1+ Cortical cataract   Vitreous Vitreous syneresis Vitreous syneresis       Fundus Exam      Right Left   Disc Pink and Sharp, Mild Temporal Peripapillary atrophy Tilted disc, Temporal Peripapillary atrophy   C/D Ratio 0.2 0.2   Macula Good foveal reflex, Retinal pigment epithelial mottling, No heme or edema Good foveal reflex, Retinal pigment epithelial mottling, No heme or edema   Vessels Vascular attenuation, AV crossing changes Vascular attenuation, AV crossing changes   Periphery Attached, mild peripheral cystoid degeneration Attached, 2 small patches of pigmented lattice with atrophic holes at 0130, focal pigmented Chorioretinal scar at 0600         Refraction    Manifest Refraction (Auto)      Sphere Cylinder Axis Dist VA   Right -2.75 +0.75 061 20/25   Left -3.00 +0.25 091 20/20       Manifest Refraction #2      Sphere Cylinder Axis Dist VA   Right -3.00 +0.75 76 20/25   Left -3.00 +0.25 091 20/20  IMAGING AND PROCEDURES  Imaging and Procedures for _0 @  OCT, Retina - OU - Both Eyes       Right Eye Quality was good. Central Foveal Thickness: 271. Progression has no prior data. Findings include normal foveal contour, no IRF, no SRF.   Left Eye Quality was good. Central Foveal Thickness: 271. Progression has no prior data. Findings include normal foveal contour, no IRF, no SRF.   Notes *Images captured and stored on drive  Diagnosis / Impression:  NFP, No IRF/SRF  Clinical management:  See below  Abbreviations: NFP - Normal foveal profile. CME - cystoid macular edema. PED - pigment epithelial detachment. IRF - intraretinal fluid. SRF - subretinal fluid. EZ - ellipsoid zone. ERM - epiretinal membrane. ORA - outer retinal atrophy. ORT - outer retinal tubulation. SRHM - subretinal hyper-reflective material         Repair Retinal Breaks, Laser - OS - Left Eye       LASER PROCEDURE NOTE  Procedure:  Barrier laser retinopexy using slit lamp laser, LEFT eye   Diagnosis:   Lattice degeneration w/ retinal holes, LEFT eye                     0130 o'clock anterior to equator   Surgeon: Bernarda Caffey, MD, PhD  Anesthesia: Topical  Informed consent obtained, operative eye marked, and time out performed prior to initiation of laser.   Laser settings:  Lumenis Smart532 laser, slit lamp Lens: [place]ster PRP 165 Power: 230 mW Spot size: 200 microns Duration: 30 msec  # spots: 226  Placement of laser: Using a [place]ster PRP 165 contact lens at the slit lamp, laser was placed in three confluent rows around patches of lattice degeneration w/ atrophic retinal holes at 0130 anterior to equator with  additional rows anteriorly.  Complications: None.  Patient tolerated the procedure well and received written and verbal post-procedure care information/education.                  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 Retinal edema H35.81 OCT, Retina - OU - Both Eyes  5. 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 Lattice degeneration w/ atrophic holes, OS - two small patches of lattice with atrophic holes at 0130 - discussed findings, prognosis, and treatment options including observation - recommend laser retinopexy OS today (08.21.19) - pt wishes to proceed with laser - RBA of procedure discussed, questions answered - informed consent obtained and signed - see procedure note - start PF QID OS x7 days - f/u in 2 wks, sooner prn  3. Hypertensive retinopathy OU - discussed importance of tight BP control - monitor  4. No retinal edema on exam or OCT  5. Combined form age related cataract OU-  - The symptoms of cataract, surgical options, and treatments and risks were discussed with patient. - discussed diagnosis and progression - not yet visually significant - monitor for now  6. Myopia w/ astigmatism - under the expert management of Dr. Parke Simmers   Ophthalmic Meds Ordered this visit:  Meds ordered this encounter  Medications  . prednisoLONE acetate (PRED FORTE)  1 % ophthalmic suspension    Sig: Place 1 drop into the left eye 4 (four) times daily for 7 days.    Dispense:  10 mL    Refill:  0       Return in about 2 weeks (around 05/08/2018) for S/P laser ret OS, DFE.  There are no Patient Instructions on file for this visit.   Explained the diagnoses,  plan, and follow up with the patient and they expressed understanding.  Patient expressed understanding of the importance of proper follow up care.   This document serves as a record of services personally performed by Gardiner Sleeper, MD, PhD. It was created on their behalf by Ernest Mallick, OA, an ophthalmic assistant. The creation of this record is the provider's dictation and/or activities during the visit.    Electronically signed by: Ernest Mallick, OA  08.20.2019 3:23 PM   This document serves as a record of services personally performed by Gardiner Sleeper, MD, PhD. It was created on their behalf by Catha Brow, [place], a certified ophthalmic assistant. The creation of this record is the provider's dictation and/or activities during the visit.  Electronically signed by: Catha Brow, [place]  08.21.19 3:23 PM    Gardiner Sleeper, M.D., Ph.D. Diseases & Surgery of the Retina and Vitreous [facility]   I have reviewed the above documentation for accuracy and completeness, and I agree with the above. Gardiner Sleeper, M.D., Ph.D. 04/24/18 3:23 PM     Abbreviations: M myopia (nearsighted); A astigmatism; H hyperopia (farsighted); P presbyopia; Mrx spectacle prescription;  CTL contact lenses; OD right eye; OS left eye; OU both eyes  XT exotropia; ET esotropia; PEK punctate epithelial keratitis; PEE punctate epithelial erosions; DES dry eye syndrome; MGD meibomian gland dysfunction; ATs artificial tears; PFAT's preservative free artificial tears; [place] nuclear sclerotic cataract; PSC posterior subcapsular cataract; ERM epi-retinal membrane; PVD posterior vitreous detachment; RD retinal detachment; DM diabetes mellitus; DR diabetic retinopathy; NPDR non-proliferative diabetic retinopathy; PDR proliferative diabetic retinopathy; CSME clinically significant macular edema; DME diabetic macular edema; dbh dot blot hemorrhages; CWS cotton wool spot; POAG primary open angle glaucoma;  C/D cup-to-disc ratio; HVF humphrey visual field; GVF goldmann visual field; OCT optical coherence tomography; IOP intraocular pressure; BRVO Branch retinal vein occlusion; CRVO central retinal vein occlusion; CRAO central retinal artery occlusion; BRAO branch retinal artery occlusion; RT retinal tear; SB scleral buckle; PPV pars plana vitrectomy; VH Vitreous hemorrhage; PRP panretinal laser photocoagulation; IVK intravitreal kenalog; VMT vitreomacular traction; MH Macular hole;  NVD neovascularization of the disc; NVE neovascularization elsewhere; AREDS age related eye disease study; ARMD age related macular degeneration; POAG primary open angle glaucoma; EBMD epithelial/anterior basement membrane dystrophy; ACIOL anterior chamber intraocular lens; IOL intraocular lens; PCIOL posterior chamber intraocular lens; Phaco/IOL phacoemulsification with intraocular lens placement; [place] photorefractive keratectomy; LASIK laser assisted in situ keratomileusis; HTN hypertension; DM diabetes mellitus; COPD chronic obstructive pulmonary disease

## 2018-04-24 ENCOUNTER — Encounter (INDEPENDENT_AMBULATORY_CARE_PROVIDER_SITE_OTHER): Payer: Self-pay | Admitting: Ophthalmology

## 2018-04-24 ENCOUNTER — Ambulatory Visit (INDEPENDENT_AMBULATORY_CARE_PROVIDER_SITE_OTHER): Payer: 59 | Admitting: Ophthalmology

## 2018-04-24 DIAGNOSIS — H25813 Combined forms of age-related cataract, bilateral: Secondary | ICD-10-CM

## 2018-04-24 DIAGNOSIS — H35033 Hypertensive retinopathy, bilateral: Secondary | ICD-10-CM

## 2018-04-24 DIAGNOSIS — H5213 Myopia, bilateral: Secondary | ICD-10-CM

## 2018-04-24 DIAGNOSIS — H35412 Lattice degeneration of retina, left eye: Secondary | ICD-10-CM

## 2018-04-24 DIAGNOSIS — H52203 Unspecified astigmatism, bilateral: Secondary | ICD-10-CM

## 2018-04-24 DIAGNOSIS — H33322 Round hole, left eye: Secondary | ICD-10-CM

## 2018-04-24 DIAGNOSIS — H3581 Retinal edema: Secondary | ICD-10-CM | POA: Diagnosis not present

## 2018-04-24 MED ORDER — PREDNISOLONE ACETATE 1 % OP SUSP: 1.0000 [drp] | Freq: Four times a day (QID) | OPHTHALMIC | 0 refills | Status: AC

## 2018-05-03 NOTE — Progress Notes (Signed)
[facility] Note  05/07/2018     CHIEF COMPLAINT Patient presents for Post-op Follow-up   HISTORY OF PRESENT ILLNESS: Katherine Rosario is a 47 y.o. female who presents to the clinic today for:   HPI    Post-op Follow-up    In left eye.  Discomfort includes none.  Negative for pain, itching, foreign body sensation, tearing, discharge and floaters.  Vision is stable.  I, the attending physician,  performed the HPI with the patient and updated documentation appropriately.          Comments    Pt presents for lattice degeneration f/u (s/p laser retinopexy OS 08.21.19), pt states VA has been okay since last visit, pt states she had FOL OD, pt denies floaters, pain and wavy vision, pt used pred forte after procedure as directed, but is not using anything right now       Last edited by Bernarda Caffey, MD on 05/07/2018  2:55 PM. (History)      Referring physician: Martinique DeMarco [address] [place], [place] [address] [phone]  HISTORICAL INFORMATION:   Selected notes from the MEDICAL RECORD NUMBER Referred by Dr. Martinique DeMarco for concern of lattice degeneration with holes LEE: 08.15.19 (J. DeMarco) [BCVA: OD: 20/20 OS: 20/20] Ocular Hx- PMH-    CURRENT MEDICATIONS: No current outpatient medications on file. (Ophthalmic Drugs)   No current facility-administered medications for this visit.  (Ophthalmic Drugs)   Current Outpatient Medications (Other)  Medication Sig  . albuterol (PROVENTIL HFA;VENTOLIN HFA) 108 (90 BASE) MCG/ACT inhaler Inhale 2 puffs into the lungs every 4 (four) hours as needed for shortness of breath.  Marland Kitchen omeprazole (PRILOSEC) 20 MG capsule Take 1 capsule (20 mg total) by mouth daily.  . ondansetron (ZOFRAN ODT) 4 MG disintegrating tablet Take 1 tablet (4 mg total) by mouth every 8 (eight) hours as needed for nausea or vomiting.  . SUMAtriptan (IMITREX) 100 MG tablet I @ ONSET of migraine;repeat in 2 hours if needed   No  current facility-administered medications for this visit.  (Other)      REVIEW OF SYSTEMS: ROS    Positive for: Respiratory, Heme/Lymph   Negative for: Constitutional, Gastrointestinal, Neurological, Skin, Genitourinary, Musculoskeletal, HENT, Endocrine, Cardiovascular, Eyes, Psychiatric, Allergic/Imm   Last edited by Debbrah Alar, COT on 05/07/2018  2:45 PM. (History)       ALLERGIES No Known Allergies  PAST MEDICAL HISTORY Past Medical History:  Diagnosis Date  . Allergic rhinitis   . Anemia    Heavy menstrual flow/OTC FE BID:    . Asthma   . Fibroids   . GERD (gastroesophageal reflux disease)   . Heart palpitations   . Hyperlipidemia   . Migraines   . OSA (obstructive sleep apnea) 05/06/2014   Past Surgical History:  Procedure Laterality Date  . UTERINE FIBROID EMBOLIZATION      FAMILY HISTORY Family History  Problem Relation Age of Onset  . Hyperlipidemia Mother   . Hypertension Mother   . Colon cancer Father 24  . Hyperlipidemia Father   . Hypertension Father   . Prostate cancer Father 41  . Heart attack Neg Hx   . Stroke Neg Hx     SOCIAL HISTORY Social History   Tobacco Use  . Smoking status: Never Smoker  . Smokeless tobacco: Current User    Types: Snuff  Substance Use Topics  . Alcohol use: Yes    Alcohol/week: 0.0 standard drinks  Comment: occ  . Drug use: No         OPHTHALMIC EXAM:  Base Eye Exam    Visual Acuity (Snellen - Linear)      Right Left   Dist cc 20/40 20/20 -1   Dist ph cc 20/30 NI   Correction:  Glasses       Tonometry (Tonopen, 2:51 PM)      Right Left   Pressure 15 14       Pupils      Dark Light Shape React APD   Right 5 3 Round Brisk None   Left 5 3 Round Brisk None       Visual Fields (Counting fingers)      Left Right    Full Full       Neuro/Psych    Oriented x3:  Yes   Mood/Affect:  Normal       Dilation    Both eyes:  1.0% Mydriacyl, 2.5% Phenylephrine @ 2:51 PM        Slit Lamp  and Fundus Exam    Slit Lamp Exam      Right Left   Lids/Lashes Dermatochalasis - upper lid, Telangiectasia, Meibomian gland dysfunction Dermatochalasis - upper lid, Telangiectasia, Meibomian gland dysfunction   Conjunctiva/Sclera White and quiet White and quiet   Cornea Clear Clear   Anterior Chamber Deep and quiet Deep and quiet   Iris Round and dilated Round and dilated   Lens 1+ Nuclear sclerosis, 1+ Cortical cataract 1+ Nuclear sclerosis, 1+ Cortical cataract   Vitreous Vitreous syneresis Vitreous syneresis       Fundus Exam      Right Left   Disc Pink and Sharp, Mild Temporal Peripapillary atrophy Tilted disc, Temporal Peripapillary atrophy   C/D Ratio 0.2 0.2   Macula Good foveal reflex, Retinal pigment epithelial mottling, No heme or edema Good foveal reflex, Retinal pigment epithelial mottling, No heme or edema   Vessels Vascular attenuation, AV crossing changes Vascular attenuation, AV crossing changes   Periphery Attached, mild peripheral cystoid degeneration Attached, 2 small patches of pigmented lattice with atrophic holes at 0130--good early laser changes, focal pigmented Chorioretinal scar at 0600          IMAGING AND PROCEDURES  Imaging and Procedures for @TODAY @           ASSESSMENT/PLAN:    ICD-10-CM   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 Retinal edema H35.81 CANCELED: OCT, Retina - OU - Both Eyes  5. 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 Lattice degeneration w/ atrophic holes, OS - two small patches of lattice with atrophic holes at 0130 - s/p laser retinopexy OS (08.21.19) -- good early laser changes - completed PF QID OS x7 days - f/u 6 weeks, possible touch up superior/anterior border  3. Hypertensive retinopathy OU -  discussed importance of tight BP control - monitor  4. No retinal edema on exam or OCT  5. Combined form age related cataract OU-  - The symptoms of cataract, surgical options, and treatments and risks were discussed with patient. - discussed diagnosis and progression - not yet visually significant - monitor for now  6. Myopia w/ astigmatism - under the expert management of  Dr. Parke Simmers   Ophthalmic Meds Ordered this visit:  No orders of the defined types were placed in this encounter.      Return in about 6 weeks (around 06/18/2018) for F/U laser ret OS, DFE, OCT.  There are no Patient Instructions on file for this visit.   Explained the diagnoses, plan, and follow up with the patient and they expressed understanding.  Patient expressed understanding of the importance of proper follow up care.   This document serves as a record of services personally performed by Gardiner Sleeper, MD, PhD. It was created on their behalf by Ernest Mallick, OA, an ophthalmic assistant. The creation of this record is the provider's dictation and/or activities during the visit.    Electronically signed by: Ernest Mallick, OA  08.30.2019 3:17 PM     Gardiner Sleeper, M.D., Ph.D. Diseases & Surgery of the Retina and Vitreous [facility]   I have reviewed the above documentation for accuracy and completeness, and I agree with the above. Gardiner Sleeper, M.D., Ph.D. 05/07/18 3:17 PM      Abbreviations: M myopia (nearsighted); A astigmatism; H hyperopia (farsighted); P presbyopia; Mrx spectacle prescription;  CTL contact lenses; OD right eye; OS left eye; OU both eyes  XT exotropia; ET esotropia; PEK punctate epithelial keratitis; PEE punctate epithelial erosions; DES dry eye syndrome; MGD meibomian gland dysfunction; ATs artificial tears; PFAT's preservative free artificial tears; [place] nuclear sclerotic cataract; PSC posterior subcapsular cataract; ERM epi-retinal membrane; PVD  posterior vitreous detachment; RD retinal detachment; DM diabetes mellitus; DR diabetic retinopathy; NPDR non-proliferative diabetic retinopathy; PDR proliferative diabetic retinopathy; CSME clinically significant macular edema; DME diabetic macular edema; dbh dot blot hemorrhages; CWS cotton wool spot; POAG primary open angle glaucoma; C/D cup-to-disc ratio; HVF humphrey visual field; GVF goldmann visual field; OCT optical coherence tomography; IOP intraocular pressure; BRVO Branch retinal vein occlusion; CRVO central retinal vein occlusion; CRAO central retinal artery occlusion; BRAO branch retinal artery occlusion; RT retinal tear; SB scleral buckle; PPV pars plana vitrectomy; VH Vitreous hemorrhage; PRP panretinal laser photocoagulation; IVK intravitreal kenalog; VMT vitreomacular traction; MH Macular hole;  NVD neovascularization of the disc; NVE neovascularization elsewhere; AREDS age related eye disease study; ARMD age related macular degeneration; POAG primary open angle glaucoma; EBMD epithelial/anterior basement membrane dystrophy; ACIOL anterior chamber intraocular lens; IOL intraocular lens; PCIOL posterior chamber intraocular lens; Phaco/IOL phacoemulsification with intraocular lens placement; [place] photorefractive keratectomy; LASIK laser assisted in situ keratomileusis; HTN hypertension; DM diabetes mellitus; COPD chronic obstructive pulmonary disease

## 2018-05-07 ENCOUNTER — Encounter (INDEPENDENT_AMBULATORY_CARE_PROVIDER_SITE_OTHER): Payer: Self-pay | Admitting: Ophthalmology

## 2018-05-07 ENCOUNTER — Ambulatory Visit (INDEPENDENT_AMBULATORY_CARE_PROVIDER_SITE_OTHER): Payer: 59 | Admitting: Ophthalmology

## 2018-05-07 DIAGNOSIS — H35033 Hypertensive retinopathy, bilateral: Secondary | ICD-10-CM

## 2018-05-07 DIAGNOSIS — H25813 Combined forms of age-related cataract, bilateral: Secondary | ICD-10-CM

## 2018-05-07 DIAGNOSIS — H33322 Round hole, left eye: Secondary | ICD-10-CM

## 2018-05-07 DIAGNOSIS — H35412 Lattice degeneration of retina, left eye: Secondary | ICD-10-CM

## 2018-05-07 DIAGNOSIS — H52203 Unspecified astigmatism, bilateral: Secondary | ICD-10-CM

## 2018-05-07 DIAGNOSIS — H5213 Myopia, bilateral: Secondary | ICD-10-CM

## 2018-05-07 DIAGNOSIS — H3581 Retinal edema: Secondary | ICD-10-CM

## 2018-05-31 ENCOUNTER — Ambulatory Visit (INDEPENDENT_AMBULATORY_CARE_PROVIDER_SITE_OTHER): Payer: 59 | Admitting: Adult Health

## 2018-05-31 ENCOUNTER — Encounter: Payer: Self-pay | Admitting: Adult Health

## 2018-05-31 VITALS — BP 122/82 | HR 94 | Temp 98.5°F | Ht 62.0 in | Wt 204.2 lb

## 2018-05-31 DIAGNOSIS — H6692 Otitis media, unspecified, left ear: Secondary | ICD-10-CM

## 2018-05-31 DIAGNOSIS — J0101 Acute recurrent maxillary sinusitis: Secondary | ICD-10-CM | POA: Diagnosis not present

## 2018-05-31 MED ORDER — AMOXICILLIN-POT CLAVULANATE 875-125 MG PO TABS: 1.0000 | ORAL_TABLET | Freq: Two times a day (BID) | ORAL | 0 refills | Status: DC

## 2018-05-31 MED ORDER — FLUTICASONE PROPIONATE 50 MCG/ACT NA SUSP: 2.0000 | Freq: Every day | NASAL | 6 refills | Status: DC

## 2018-05-31 NOTE — Progress Notes (Signed)
Subjective:    Patient ID: Katherine Rosario, female    DOB: 02-Jul-1971, 47 y.o.   MRN: [id number]  URI   This is a new problem. The current episode started in the past 7 days. The maximum temperature recorded prior to her arrival was 101 - 101.9 F. The fever has been present for 5 days or more. Associated symptoms include congestion, ear pain (Left), headaches, rhinorrhea and sinus pain. Pertinent negatives include no coughing, nausea, sore throat or wheezing. She has tried acetaminophen (saline ) for the symptoms. The treatment provided mild relief.   Review of Systems  Constitutional: Positive for activity change, appetite change, diaphoresis, fatigue and fever.  HENT: Positive for congestion, ear pain (Left), rhinorrhea and sinus pain. Negative for ear discharge and sore throat.   Respiratory: Negative for cough and wheezing.   Cardiovascular: Negative.   Gastrointestinal: Negative.  Negative for nausea.  Neurological: Positive for headaches.   Past Medical History:  Diagnosis Date  . Allergic rhinitis   . Anemia    Heavy menstrual flow/OTC FE BID:    . Asthma   . Fibroids   . GERD (gastroesophageal reflux disease)   . Heart palpitations   . Hyperlipidemia   . Migraines   . OSA (obstructive sleep apnea) 05/06/2014    Social History   Socioeconomic History  . Marital status: Married    Spouse name: Not on file  . Number of children: Not on file  . Years of education: Not on file  . Highest education level: Not on file  Occupational History  . Occupation: Medical illustrator: [employer]  Social Needs  . Financial resource strain: Not on file  . Food insecurity:    Worry: Not on file    Inability: Not on file  . Transportation needs:    Medical: Not on file    Non-medical: Not on file  Tobacco Use  . Smoking status: Never Smoker  . Smokeless tobacco: Current User    Types: Snuff  Substance and Sexual Activity  . Alcohol use: Yes   Alcohol/week: 0.0 standard drinks    Comment: occ  . Drug use: No  . Sexual activity: Never  Lifestyle  . Physical activity:    Days per week: Not on file    Minutes per session: Not on file  . Stress: Not on file  Relationships  . Social connections:    Talks on phone: Not on file    Gets together: Not on file    Attends religious service: Not on file    Active member of club or organization: Not on file    Attends meetings of clubs or organizations: Not on file    Relationship status: Not on file  . Intimate partner violence:    Fear of current or ex partner: Not on file    Emotionally abused: Not on file    Physically abused: Not on file    Forced sexual activity: Not on file  Other Topics Concern  . 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      Pt lives with wife, in a one story home. Has some college education.       She likes to hike and swim.     Past Surgical History:  Procedure Laterality Date  . UTERINE FIBROID EMBOLIZATION      Family History  Problem Relation Age of Onset  .  Hyperlipidemia Mother   . Hypertension Mother   . Colon cancer Father 70  . Hyperlipidemia Father   . Hypertension Father   . Prostate cancer Father 54  . Heart attack Neg Hx   . Stroke Neg Hx     No Known Allergies  Current Outpatient Medications on File Prior to Visit  Medication Sig Dispense Refill  . albuterol (PROVENTIL HFA;VENTOLIN HFA) 108 (90 BASE) MCG/ACT inhaler Inhale 2 puffs into the lungs every 4 (four) hours as needed for shortness of breath.    Marland Kitchen omeprazole (PRILOSEC) 20 MG capsule Take 1 capsule (20 mg total) by mouth daily. 90 capsule 3  . ondansetron (ZOFRAN ODT) 4 MG disintegrating tablet Take 1 tablet (4 mg total) by mouth every 8 (eight) hours as needed for nausea or vomiting. 8 tablet 0  . SUMAtriptan (IMITREX) 100 MG tablet I @ ONSET of migraine;repeat in 2 hours if needed 27 tablet 2     No current facility-administered medications on file prior to visit.     BP 122/82 (BP Location: Left Arm, Patient Position: Sitting, Cuff Size: Large)   Pulse 94   Temp 98.5 F (36.9 C) (Oral)   Ht 5\' 2"  (1.575 m)   Wt 204 lb 3.2 oz (92.6 kg)   SpO2 97%   BMI 37.35 kg/m       Objective:   Physical Exam  Constitutional: She appears well-developed and well-nourished. No distress.  HENT:  Right Ear: Hearing, tympanic membrane, external ear and ear canal normal. Tympanic membrane is not erythematous and not bulging. No middle ear effusion.  Left Ear: Tympanic membrane is erythematous and bulging. A middle ear effusion is present.  Nose: Mucosal edema and rhinorrhea present. Right sinus exhibits maxillary sinus tenderness. Right sinus exhibits no frontal sinus tenderness. Left sinus exhibits maxillary sinus tenderness. Left sinus exhibits no frontal sinus tenderness.  Mouth/Throat: Uvula is midline and mucous membranes are normal. Posterior oropharyngeal erythema present. No oropharyngeal exudate, posterior oropharyngeal edema or tonsillar abscesses. No tonsillar exudate.  Cardiovascular: Normal rate, regular rhythm, normal heart sounds and intact distal pulses.  Pulmonary/Chest: Effort normal and breath sounds normal.  Skin: She is not diaphoretic.  Nursing note and vitals reviewed.     Assessment & Plan:  1. Acute recurrent maxillary sinusitis - Follow up if no improvement in the next 2-3 days  - Stay hydrated and rest over the weekend  - Tylenol for symptom relief - amoxicillin-clavulanate (AUGMENTIN) 875-125 MG tablet; Take 1 tablet by mouth 2 (two) times daily.  Dispense: 20 tablet; Refill: 0 - fluticasone (FLONASE) 50 MCG/ACT nasal spray; Place 2 sprays into both nostrils daily.  Dispense: 16 g; Refill: 6  2. Left otitis media, unspecified otitis media type  - amoxicillin-clavulanate (AUGMENTIN) 875-125 MG tablet; Take 1 tablet by mouth 2 (two) times daily.  Dispense: 20  tablet; Refill: 0 - fluticasone (FLONASE) 50 MCG/ACT nasal spray; Place 2 sprays into both nostrils daily.  Dispense: 16 g; Refill: 6   Dorothyann Peng, NP

## 2018-06-17 NOTE — Progress Notes (Signed)
[facility] Note  06/18/2018     CHIEF COMPLAINT Patient presents for Retina Follow Up   HISTORY OF PRESENT ILLNESS: Katherine Rosario is a 47 y.o. female who presents to the clinic today for:   HPI    Retina Follow Up    Patient presents with  Other.  In left eye.  This started 2 months ago.  Severity is mild.  Since onset it is stable.  I, the attending physician,  performed the HPI with the patient and updated documentation appropriately.          Comments    F/U Lattice degen. OS. Patient states her vision has" gotten better" since last Ov and laser retinopexy os (04/24/18).        Last edited by Bernarda Caffey, MD on 06/19/2018 12:43 PM. (History)      Referring physician: Martinique DeMarco [address], [place] [address] [phone]  HISTORICAL INFORMATION:   Selected notes from the MEDICAL RECORD NUMBER Referred by Dr. Martinique DeMarco for concern of lattice degeneration with holes LEE: 08.15.19 (J. DeMarco) [BCVA: OD: 20/20 OS: 20/20] Ocular Hx- PMH-    CURRENT MEDICATIONS: No current outpatient medications on file. (Ophthalmic Drugs)   No current facility-administered medications for this visit.  (Ophthalmic Drugs)   Current Outpatient Medications (Other)  Medication Sig  . albuterol (PROVENTIL HFA;VENTOLIN HFA) 108 (90 BASE) MCG/ACT inhaler Inhale 2 puffs into the lungs every 4 (four) hours as needed for shortness of breath.  Marland Kitchen amoxicillin-clavulanate (AUGMENTIN) 875-125 MG tablet Take 1 tablet by mouth 2 (two) times daily.  . fluticasone (FLONASE) 50 MCG/ACT nasal spray Place 2 sprays into both nostrils daily.  Marland Kitchen omeprazole (PRILOSEC) 20 MG capsule Take 1 capsule (20 mg total) by mouth daily.  . ondansetron (ZOFRAN ODT) 4 MG disintegrating tablet Take 1 tablet (4 mg total) by mouth every 8 (eight) hours as needed for nausea or vomiting.  . SUMAtriptan (IMITREX) 100 MG tablet I @ ONSET of migraine;repeat in 2 hours if  needed   No current facility-administered medications for this visit.  (Other)      REVIEW OF SYSTEMS: ROS    Positive for: Eyes   Negative for: Constitutional, Gastrointestinal, Neurological, Skin, Genitourinary, Musculoskeletal, HENT, Endocrine, Cardiovascular, Respiratory, Psychiatric, Allergic/Imm, Heme/Lymph   Last edited by Zenovia Jordan, LPN on 08/67/6195  0:93 PM. (History)       ALLERGIES No Known Allergies  PAST MEDICAL HISTORY Past Medical History:  Diagnosis Date  . Allergic rhinitis   . Anemia    Heavy menstrual flow/OTC FE BID:    . Asthma   . Fibroids   . GERD (gastroesophageal reflux disease)   . Heart palpitations   . Hyperlipidemia   . Migraines   . OSA (obstructive sleep apnea) 05/06/2014   Past Surgical History:  Procedure Laterality Date  . UTERINE FIBROID EMBOLIZATION      FAMILY HISTORY Family History  Problem Relation Age of Onset  . Hyperlipidemia Mother   . Hypertension Mother   . Colon cancer Father 26  . Hyperlipidemia Father   . Hypertension Father   . Prostate cancer Father 86  . Heart attack Neg Hx   . Stroke Neg Hx     SOCIAL HISTORY Social History   Tobacco Use  . Smoking status: Never Smoker  . Smokeless tobacco: Current User    Types: Snuff  Substance Use Topics  . Alcohol use: Yes    Alcohol/week: 0.0  standard drinks    Comment: occ  . Drug use: No         OPHTHALMIC EXAM:  Base Eye Exam    Visual Acuity (Snellen - Linear)      Right Left   Dist cc 20/20 20/20       Tonometry (Tonopen, 2:54 PM)      Right Left   Pressure 10 12       Pupils      Dark Light Shape React APD   Right 4 3 Round Brisk None   Left 4 3 Round Brisk None       Visual Fields (Counting fingers)      Left Right    Full Full       Extraocular Movement      Right Left    Full, Ortho Full, Ortho       Neuro/Psych    Oriented x3:  Yes   Mood/Affect:  Normal       Dilation    Both eyes:  1.0% Mydriacyl, 2.5%  Phenylephrine @ 2:54 PM        Slit Lamp and Fundus Exam    Slit Lamp Exam      Right Left   Lids/Lashes Dermatochalasis - upper lid, Telangiectasia, Meibomian gland dysfunction Dermatochalasis - upper lid, Telangiectasia, Meibomian gland dysfunction   Conjunctiva/Sclera White and quiet White and quiet   Cornea Clear Clear   Anterior Chamber Deep and quiet Deep and quiet   Iris Round and dilated Round and dilated   Lens 1+ Nuclear sclerosis, 1+ Cortical cataract 1+ Nuclear sclerosis, 1+ Cortical cataract   Vitreous Vitreous syneresis Vitreous syneresis       Fundus Exam      Right Left   Disc Pink and Sharp, Mild Temporal Peripapillary atrophy Tilted disc, Temporal Peripapillary atrophy   C/D Ratio 0.2 0.2   Macula Good foveal reflex, Retinal pigment epithelial mottling, No heme or edema Good foveal reflex, Retinal pigment epithelial mottling, No heme or edema   Vessels Vascular attenuation, AV crossing changes Vascular attenuation, AV crossing changes   Periphery Attached, mild peripheral cystoid degeneration Attached, 2 small patches of pigmented lattice with atrophic holes at 0130--good laser surrounding--stable, focal pigmented Chorioretinal scar at 0600          IMAGING AND PROCEDURES  Imaging and Procedures for @TODAY @  OCT, Retina - OU - Both Eyes       Right Eye Quality was good. Central Foveal Thickness: 271. Progression has been stable. Findings include normal foveal contour, no IRF, no SRF.   Left Eye Quality was good. Central Foveal Thickness: 272. Progression has been stable. Findings include normal foveal contour, no IRF, no SRF.   Notes *Images captured and stored on drive  Diagnosis / Impression:  NFP, No IRF/SRF OU  Clinical management:  See below  Abbreviations: NFP - Normal foveal profile. CME - cystoid macular edema. PED - pigment epithelial detachment. IRF - intraretinal fluid. SRF - subretinal fluid. EZ - ellipsoid zone. ERM - epiretinal  membrane. ORA - outer retinal atrophy. ORT - outer retinal tubulation. 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 Retinal edema H35.81 OCT, Retina -  OU - Both Eyes  5. 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 Lattice degeneration w/ atrophic holes, OS - two small patches of lattice with atrophic holes at 0130 - s/p laser retinopexy OS (08.21.19) -- good laser surrounding lesions - completed PF QID OS x7 days - f/u 3 wks  3. Hypertensive retinopathy OU - discussed importance of tight BP control - monitor  4. No retinal edema on exam or OCT  5. Combined form age related cataract OU-  - The symptoms of cataract, surgical options, and treatments and risks were discussed with patient. - discussed diagnosis and progression - not yet visually significant - monitor for now  6. Myopia w/ astigmatism - under the expert management of Dr. Parke Simmers   Ophthalmic Meds Ordered this visit:  No orders of the defined types were placed in this encounter.      Return in about 3 weeks (around 07/09/2018) for F/U laser ret OS, DFE, OCT.  There are no Patient Instructions on file for this visit.   Explained the diagnoses, plan, and follow up with the patient and they expressed understanding.  Patient expressed understanding of the importance of proper follow up care.   This document serves as a record of services personally performed by Gardiner Sleeper, MD, PhD. It was created on their behalf by Catha Brow, [place], a certified ophthalmic assistant. The creation of this record is the provider's dictation and/or activities during the visit.  Electronically signed by: Catha Brow,  [place]  10.14.19 12:44 PM   Gardiner Sleeper, M.D., Ph.D. Diseases & Surgery of the Retina and Vitreous [facility]   I have reviewed the above documentation for accuracy and completeness, and I agree with the above. Gardiner Sleeper, M.D., Ph.D. 06/19/18 12:45 PM    Abbreviations: M myopia (nearsighted); A astigmatism; H hyperopia (farsighted); P presbyopia; Mrx spectacle prescription;  CTL contact lenses; OD right eye; OS left eye; OU both eyes  XT exotropia; ET esotropia; PEK punctate epithelial keratitis; PEE punctate epithelial erosions; DES dry eye syndrome; MGD meibomian gland dysfunction; ATs artificial tears; PFAT's preservative free artificial tears; [place] nuclear sclerotic cataract; PSC posterior subcapsular cataract; ERM epi-retinal membrane; PVD posterior vitreous detachment; RD retinal detachment; DM diabetes mellitus; DR diabetic retinopathy; NPDR non-proliferative diabetic retinopathy; PDR proliferative diabetic retinopathy; CSME clinically significant macular edema; DME diabetic macular edema; dbh dot blot hemorrhages; CWS cotton wool spot; POAG primary open angle glaucoma; C/D cup-to-disc ratio; HVF humphrey visual field; GVF goldmann visual field; OCT optical coherence tomography; IOP intraocular pressure; BRVO Branch retinal vein occlusion; CRVO central retinal vein occlusion; CRAO central retinal artery occlusion; BRAO branch retinal artery occlusion; RT retinal tear; SB scleral buckle; PPV pars plana vitrectomy; VH Vitreous hemorrhage; PRP panretinal laser photocoagulation; IVK intravitreal kenalog; VMT vitreomacular traction; MH Macular hole;  NVD neovascularization of the disc; NVE neovascularization elsewhere; AREDS age related eye disease study; ARMD age related macular degeneration; POAG primary open angle glaucoma; EBMD epithelial/anterior basement membrane dystrophy; ACIOL anterior chamber intraocular lens; IOL intraocular lens; PCIOL posterior chamber  intraocular lens; Phaco/IOL phacoemulsification with intraocular lens placement; [place] photorefractive keratectomy; LASIK laser assisted in situ keratomileusis; HTN hypertension; DM diabetes mellitus; COPD chronic obstructive pulmonary disease

## 2018-06-18 ENCOUNTER — Encounter (INDEPENDENT_AMBULATORY_CARE_PROVIDER_SITE_OTHER): Payer: Self-pay | Admitting: Ophthalmology

## 2018-06-18 ENCOUNTER — Ambulatory Visit (INDEPENDENT_AMBULATORY_CARE_PROVIDER_SITE_OTHER): Payer: 59 | Admitting: Ophthalmology

## 2018-06-18 DIAGNOSIS — H52203 Unspecified astigmatism, bilateral: Secondary | ICD-10-CM

## 2018-06-18 DIAGNOSIS — H5213 Myopia, bilateral: Secondary | ICD-10-CM

## 2018-06-18 DIAGNOSIS — H35412 Lattice degeneration of retina, left eye: Secondary | ICD-10-CM

## 2018-06-18 DIAGNOSIS — H3581 Retinal edema: Secondary | ICD-10-CM

## 2018-06-18 DIAGNOSIS — H35033 Hypertensive retinopathy, bilateral: Secondary | ICD-10-CM

## 2018-06-18 DIAGNOSIS — H25813 Combined forms of age-related cataract, bilateral: Secondary | ICD-10-CM

## 2018-06-18 DIAGNOSIS — H33322 Round hole, left eye: Secondary | ICD-10-CM

## 2018-06-19 ENCOUNTER — Encounter (INDEPENDENT_AMBULATORY_CARE_PROVIDER_SITE_OTHER): Payer: Self-pay | Admitting: Ophthalmology

## 2018-07-05 NOTE — Progress Notes (Addendum)
[facility] Note  07/09/2018     CHIEF COMPLAINT Patient presents for Post-op Follow-up   HISTORY OF PRESENT ILLNESS: Katherine Rosario is a 47 y.o. female who presents to the clinic today for:   HPI    Post-op Follow-up    In left eye.  Discomfort includes none.  Negative for pain, itching, foreign body sensation, tearing, discharge and floaters.  Vision is stable.  I, the attending physician,  performed the HPI with the patient and updated documentation appropriately.          Comments    Pt presents for lattice OS f/u (s/p laser retinopexy 08.21.19), pt states vision has been good, she denies flashes, floaters, pain or wavy vision       Last edited by Bernarda Caffey, MD on 07/09/2018  8:29 AM. (History)    pt states she is not having any problems, denies new flashes and floaters  Referring physician: Martinique DeMarco [address] [place], [place] [address] [phone]  HISTORICAL INFORMATION:   Selected notes from the MEDICAL RECORD NUMBER Referred by Dr. Martinique DeMarco for concern of lattice degeneration with holes LEE: 08.15.19 (J. DeMarco) [BCVA: OD: 20/20 OS: 20/20] Ocular Hx- PMH-    CURRENT MEDICATIONS: No current outpatient medications on file. (Ophthalmic Drugs)   No current facility-administered medications for this visit.  (Ophthalmic Drugs)   Current Outpatient Medications (Other)  Medication Sig  . albuterol (PROVENTIL HFA;VENTOLIN HFA) 108 (90 BASE) MCG/ACT inhaler Inhale 2 puffs into the lungs every 4 (four) hours as needed for shortness of breath.  Marland Kitchen amoxicillin-clavulanate (AUGMENTIN) 875-125 MG tablet Take 1 tablet by mouth 2 (two) times daily.  . fluticasone (FLONASE) 50 MCG/ACT nasal spray Place 2 sprays into both nostrils daily.  Marland Kitchen omeprazole (PRILOSEC) 20 MG capsule Take 1 capsule (20 mg total) by mouth daily.  . ondansetron (ZOFRAN ODT) 4 MG disintegrating tablet Take 1 tablet (4 mg total) by mouth every 8  (eight) hours as needed for nausea or vomiting.  . SUMAtriptan (IMITREX) 100 MG tablet I @ ONSET of migraine;repeat in 2 hours if needed   No current facility-administered medications for this visit.  (Other)      REVIEW OF SYSTEMS: ROS    Positive for: Eyes   Negative for: Constitutional, Gastrointestinal, Neurological, Skin, Genitourinary, Musculoskeletal, HENT, Endocrine, Cardiovascular, Respiratory, Psychiatric, Allergic/Imm, Heme/Lymph   Last edited by Debbrah Alar, COT on 07/09/2018  7:54 AM. (History)       ALLERGIES No Known Allergies  PAST MEDICAL HISTORY Past Medical History:  Diagnosis Date  . Allergic rhinitis   . Anemia    Heavy menstrual flow/OTC FE BID:    . Asthma   . Fibroids   . GERD (gastroesophageal reflux disease)   . Heart palpitations   . Hyperlipidemia   . Migraines   . OSA (obstructive sleep apnea) 05/06/2014   Past Surgical History:  Procedure Laterality Date  . UTERINE FIBROID EMBOLIZATION      FAMILY HISTORY Family History  Problem Relation Age of Onset  . Hyperlipidemia Mother   . Hypertension Mother   . Colon cancer Father 100  . Hyperlipidemia Father   . Hypertension Father   . Prostate cancer Father 73  . Heart attack Neg Hx   . Stroke Neg Hx     SOCIAL HISTORY Social History   Tobacco Use  . Smoking status: Never Smoker  . Smokeless tobacco: Current User    Types: Snuff  Substance Use Topics  . Alcohol use: Yes    Alcohol/week: 0.0 standard drinks    Comment: occ  . Drug use: No         OPHTHALMIC EXAM:  Base Eye Exam    Visual Acuity (Snellen - Linear)      Right Left   Dist cc 20/20 20/20   Correction:  Glasses       Tonometry (Tonopen, 7:59 AM)      Right Left   Pressure 16 19       Pupils      Dark Light Shape React APD   Right 4 2 Round Brisk None   Left 4 2 Round Brisk None       Visual Fields (Counting fingers)      Left Right    Full Full       Extraocular Movement      Right Left     Full, Ortho Full, Ortho       Neuro/Psych    Oriented x3:  Yes   Mood/Affect:  Normal       Dilation    Both eyes:  1.0% Mydriacyl, 2.5% Phenylephrine @ 7:59 AM        Slit Lamp and Fundus Exam    Slit Lamp Exam      Right Left   Lids/Lashes Dermatochalasis - upper lid, Telangiectasia, Meibomian gland dysfunction Dermatochalasis - upper lid, Telangiectasia, Meibomian gland dysfunction   Conjunctiva/Sclera White and quiet White and quiet   Cornea Clear Clear   Anterior Chamber Deep and quiet Deep and quiet   Iris Round and dilated Round and dilated   Lens 1+ Nuclear sclerosis, 1+ Cortical cataract 1+ Nuclear sclerosis, 1+ Cortical cataract   Vitreous Vitreous syneresis Vitreous syneresis       Fundus Exam      Right Left   Disc Pink and Sharp, Mild Temporal Peripapillary atrophy, mild tilt Tilted disc, Temporal Peripapillary atrophy   C/D Ratio 0.2 0.2   Macula Good foveal reflex, Retinal pigment epithelial mottling, No heme or edema Good foveal reflex, Retinal pigment epithelial mottling, No heme or edema   Vessels Vascular attenuation, AV crossing changes Vascular attenuation, AV crossing changes   Periphery Attached, mild peripheral cystoid degeneration, small patch of lattice at 1200 Attached, 2 small patches of pigmented lattice with atrophic holes at 0130--good laser surrounding--stable, focal pigmented Chorioretinal scar at 0600 - no break, no SRF          IMAGING AND PROCEDURES  Imaging and Procedures for @TODAY @  OCT, Retina - OU - Both Eyes       Right Eye Quality was good. Central Foveal Thickness: 267. Progression has been stable. Findings include normal foveal contour, no IRF, no SRF.   Left Eye Quality was good. Central Foveal Thickness: 266. Progression has been stable. Findings include normal foveal contour, no IRF, no SRF.   Notes *Images captured and stored on drive  Diagnosis / Impression:  NFP, No IRF/SRF OU  Clinical management:  See  below  Abbreviations: NFP - Normal foveal profile. CME - cystoid macular edema. PED - pigment epithelial detachment. IRF - intraretinal fluid. SRF - subretinal fluid. EZ - ellipsoid zone. ERM - epiretinal membrane. ORA - outer retinal atrophy. ORT - outer retinal tubulation. SRHM - subretinal hyper-reflective material                  ASSESSMENT/PLAN:    ICD-10-CM   1. Bilateral retinal lattice degeneration H35.413  2. 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 Retinal edema H35.81 OCT, Retina - OU - Both Eyes  5. 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 Lattice degeneration w/ atrophic holes, OU - two small patches of lattice with atrophic holes at 0130 - s/p laser retinopexy OS (08.21.19) -- good laser surrounding lesions - newly identified small patch of lattice at 1200 OD - recommend laser retinopexy OD, today 11.05.19 - pt wants to wait until she has insurance again - discussed risks of retinal tear/detachment - reviewed s/s of RT/RD - strict return precautions for any such s/s  - f/u 10 weeks  3. Hypertensive retinopathy OU - discussed importance of tight BP control - monitor  4. No retinal edema on exam or OCT  5. Combined form age related cataract OU-  - The symptoms of cataract, surgical options, and treatments and risks were discussed with patient. - discussed diagnosis and progression - not yet visually significant - monitor for now  6. Myopia w/ astigmatism - under the expert management of Dr. Parke Simmers   Ophthalmic Meds Ordered this visit:  No orders of the defined types were placed in this encounter.      Return in about 10 weeks (around 09/17/2018) for f/u lattice OD.  There are no Patient Instructions on file for this visit.   Explained the diagnoses, plan, and follow up with the patient and they expressed  understanding.  Patient expressed understanding of the importance of proper follow up care.   This document serves as a record of services personally performed by Gardiner Sleeper, MD, PhD. It was created on their behalf by Ernest Mallick, OA, an ophthalmic assistant. The creation of this record is the provider's dictation and/or activities during the visit.    Electronically signed by: Ernest Mallick, OA  11.05.19 8:36 AM     Gardiner Sleeper, M.D., Ph.D. Diseases & Surgery of the Retina and Vitreous [facility]   I have reviewed the above documentation for accuracy and completeness, and I agree with the above. Gardiner Sleeper, M.D., Ph.D. 07/09/18 8:34 AM    Abbreviations: M myopia (nearsighted); A astigmatism; H hyperopia (farsighted); P presbyopia; Mrx spectacle prescription;  CTL contact lenses; OD right eye; OS left eye; OU both eyes  XT exotropia; ET esotropia; PEK punctate epithelial keratitis; PEE punctate epithelial erosions; DES dry eye syndrome; MGD meibomian gland dysfunction; ATs artificial tears; PFAT's preservative free artificial tears; [place] nuclear sclerotic cataract; PSC posterior subcapsular cataract; ERM epi-retinal membrane; PVD posterior vitreous detachment; RD retinal detachment; DM diabetes mellitus; DR diabetic retinopathy; NPDR non-proliferative diabetic retinopathy; PDR proliferative diabetic retinopathy; CSME clinically significant macular edema; DME diabetic macular edema; dbh dot blot hemorrhages; CWS cotton wool spot; POAG primary open angle glaucoma; C/D cup-to-disc ratio; HVF humphrey visual field; GVF goldmann visual field; OCT optical coherence tomography; IOP intraocular pressure; BRVO Branch retinal vein occlusion; CRVO central retinal vein occlusion; CRAO central retinal artery occlusion; BRAO branch retinal artery occlusion; RT retinal tear; SB scleral buckle; PPV pars plana vitrectomy; VH Vitreous hemorrhage; PRP panretinal laser  photocoagulation; IVK intravitreal kenalog; VMT vitreomacular traction; MH Macular hole;  NVD neovascularization of the disc; NVE neovascularization elsewhere; AREDS age related eye disease study; ARMD age related macular degeneration; POAG primary open angle glaucoma; EBMD epithelial/anterior basement  membrane dystrophy; ACIOL anterior chamber intraocular lens; IOL intraocular lens; PCIOL posterior chamber intraocular lens; Phaco/IOL phacoemulsification with intraocular lens placement; [place] photorefractive keratectomy; LASIK laser assisted in situ keratomileusis; HTN hypertension; DM diabetes mellitus; COPD chronic obstructive pulmonary disease

## 2018-07-09 ENCOUNTER — Ambulatory Visit (INDEPENDENT_AMBULATORY_CARE_PROVIDER_SITE_OTHER): Payer: Self-pay | Admitting: Ophthalmology

## 2018-07-09 ENCOUNTER — Encounter (INDEPENDENT_AMBULATORY_CARE_PROVIDER_SITE_OTHER): Payer: Self-pay | Admitting: Ophthalmology

## 2018-07-09 DIAGNOSIS — H5213 Myopia, bilateral: Secondary | ICD-10-CM

## 2018-07-09 DIAGNOSIS — H3581 Retinal edema: Secondary | ICD-10-CM

## 2018-07-09 DIAGNOSIS — H35033 Hypertensive retinopathy, bilateral: Secondary | ICD-10-CM

## 2018-07-09 DIAGNOSIS — H33323 Round hole, bilateral: Secondary | ICD-10-CM

## 2018-07-09 DIAGNOSIS — H25813 Combined forms of age-related cataract, bilateral: Secondary | ICD-10-CM

## 2018-07-09 DIAGNOSIS — H35413 Lattice degeneration of retina, bilateral: Secondary | ICD-10-CM

## 2018-07-09 DIAGNOSIS — H52203 Unspecified astigmatism, bilateral: Secondary | ICD-10-CM

## 2018-09-17 ENCOUNTER — Encounter (INDEPENDENT_AMBULATORY_CARE_PROVIDER_SITE_OTHER): Payer: Self-pay | Admitting: Ophthalmology

## 2019-03-11 ENCOUNTER — Other Ambulatory Visit: Payer: Self-pay | Admitting: Family Medicine

## 2019-03-11 DIAGNOSIS — R300 Dysuria: Secondary | ICD-10-CM

## 2019-03-25 ENCOUNTER — Ambulatory Visit (INDEPENDENT_AMBULATORY_CARE_PROVIDER_SITE_OTHER): Payer: 59 | Admitting: Adult Health

## 2019-03-25 ENCOUNTER — Other Ambulatory Visit: Payer: Self-pay

## 2019-03-25 ENCOUNTER — Encounter: Payer: Self-pay | Admitting: Adult Health

## 2019-03-25 DIAGNOSIS — J069 Acute upper respiratory infection, unspecified: Secondary | ICD-10-CM | POA: Diagnosis not present

## 2019-03-25 MED ORDER — PREDNISONE 10 MG PO TABS
ORAL_TABLET | ORAL | 0 refills | Status: DC
Start: 2019-03-25 — End: 2019-04-02

## 2019-03-25 MED ORDER — DOXYCYCLINE HYCLATE 100 MG PO CAPS: 100.0000 mg | ORAL_CAPSULE | Freq: Two times a day (BID) | ORAL | 0 refills | Status: DC

## 2019-03-25 MED ORDER — HYDROCODONE-HOMATROPINE 5-1.5 MG/5ML PO SYRP: 5.0000 mL | ORAL_SOLUTION | Freq: Three times a day (TID) | ORAL | 0 refills | Status: DC | PRN

## 2019-03-25 NOTE — Progress Notes (Signed)
Virtual Visit via Video Note  I connected with Katherine Rosario on 03/25/19 at  1:30 PM EDT by a video enabled telemedicine application and verified that I am speaking with the correct person using two identifiers.  Location patient: home Location provider:work or home office Persons participating in the virtual visit: patient, provider  I discussed the limitations of evaluation and management by telemedicine and the availability of in person appointments. The patient expressed understanding and agreed to proceed.   HPI: 48 year old female who is being evaluated today for an acute issue.  Symptoms started 9 days ago.  Her symptoms include expiratory wheezing dry hacking cough, shortness of breath, chest congestion, and fever up to 100.4.  She had COVID testing done early last week at [facility] received the results over the weekend which were negative.  Reports that the cough is keeping her up at night   ROS: See pertinent positives and negatives per HPI.  Past Medical History:  Diagnosis Date  . Allergic rhinitis   . Anemia    Heavy menstrual flow/OTC FE BID:    . Asthma   . Fibroids   . GERD (gastroesophageal reflux disease)   . Heart palpitations   . Hyperlipidemia   . Migraines   . OSA (obstructive sleep apnea) 05/06/2014    Past Surgical History:  Procedure Laterality Date  . UTERINE FIBROID EMBOLIZATION      Family History  Problem Relation Age of Onset  . Hyperlipidemia Mother   . Hypertension Mother   . Colon cancer Father 72  . Hyperlipidemia Father   . Hypertension Father   . Prostate cancer Father 68  . Heart attack Neg Hx   . Stroke Neg Hx       Current Outpatient Medications:  .  albuterol (PROVENTIL HFA;VENTOLIN HFA) 108 (90 BASE) MCG/ACT inhaler, Inhale 2 puffs into the lungs every 4 (four) hours as needed for shortness of breath., Disp: , Rfl:  .  amoxicillin-clavulanate (AUGMENTIN) 875-125 MG tablet, Take 1 tablet by mouth 2 (two) times daily., Disp: 20 tablet,  Rfl: 0 .  fluticasone (FLONASE) 50 MCG/ACT nasal spray, Place 2 sprays into both nostrils daily., Disp: 16 g, Rfl: 6 .  omeprazole (PRILOSEC) 20 MG capsule, Take 1 capsule (20 mg total) by mouth daily., Disp: 90 capsule, Rfl: 3 .  ondansetron (ZOFRAN ODT) 4 MG disintegrating tablet, Take 1 tablet (4 mg total) by mouth every 8 (eight) hours as needed for nausea or vomiting., Disp: 8 tablet, Rfl: 0 .  SUMAtriptan (IMITREX) 100 MG tablet, I @ ONSET of migraine;repeat in 2 hours if needed, Disp: 27 tablet, Rfl: 2  EXAM:  VITALS per patient if applicable:  GENERAL: alert, oriented, appears well and in no acute distress  HEENT: atraumatic, conjunttiva clear, no obvious abnormalities on inspection of external nose and ears  NECK: normal movements of the head and neck  LUNGS: on inspection no signs of respiratory distress, breathing rate appears normal, no obvious gross SOB, or gasping. Wheezing noted  CV: no obvious cyanosis  MS: moves all visible extremities without noticeable abnormality  PSYCH/NEURO: pleasant and cooperative, no obvious depression or anxiety, speech and thought processing grossly intact  ASSESSMENT AND PLAN:  Discussed the following assessment and plan:  1. Upper respiratory tract infection, unspecified type - Will cover for PNAd/t fever. Advised rest and to stay hydrated. Follow up in 2-3 days if no improvement  - doxycycline (VIBRAMYCIN) 100 MG capsule; Take 1 capsule (100 mg total) by mouth 2 (two)  times daily.  Dispense: 14 capsule; Refill: 0 - predniSONE (DELTASONE) 10 MG tablet; 40 mg x 3 days, 20 mg x 3 days, 10 mg x 3 days  Dispense: 21 tablet; Refill: 0 - HYDROcodone-homatropine (HYCODAN) 5-1.5 MG/5ML syrup; Take 5 mLs by mouth every 8 (eight) hours as needed for cough.  Dispense: 120 mL; Refill: 0     I discussed the assessment and treatment plan with the patient. The patient was provided an opportunity to ask questions and all were answered. The patient  agreed with the plan and demonstrated an understanding of the instructions.   The patient was advised to call back or seek an in-person evaluation if the symptoms worsen or if the condition fails to improve as anticipated.   Dorothyann Peng, NP

## 2019-03-31 ENCOUNTER — Encounter: Payer: Self-pay | Admitting: Adult Health

## 2019-04-01 NOTE — Telephone Encounter (Signed)
Pt scheduled to see Tommi Rumps on 04/02/2019 by virtual visit.  Nothing further needed.

## 2019-04-02 ENCOUNTER — Ambulatory Visit (INDEPENDENT_AMBULATORY_CARE_PROVIDER_SITE_OTHER): Payer: 59

## 2019-04-02 ENCOUNTER — Encounter: Payer: Self-pay | Admitting: Adult Health

## 2019-04-02 ENCOUNTER — Other Ambulatory Visit: Payer: Self-pay | Admitting: Adult Health

## 2019-04-02 ENCOUNTER — Telehealth: Payer: Self-pay | Admitting: Adult Health

## 2019-04-02 ENCOUNTER — Other Ambulatory Visit (INDEPENDENT_AMBULATORY_CARE_PROVIDER_SITE_OTHER): Payer: 59

## 2019-04-02 ENCOUNTER — Other Ambulatory Visit: Payer: Self-pay

## 2019-04-02 ENCOUNTER — Ambulatory Visit (INDEPENDENT_AMBULATORY_CARE_PROVIDER_SITE_OTHER): Payer: 59 | Admitting: Adult Health

## 2019-04-02 DIAGNOSIS — J069 Acute upper respiratory infection, unspecified: Secondary | ICD-10-CM | POA: Diagnosis not present

## 2019-04-02 LAB — CBC WITH DIFFERENTIAL/PLATELET
Basophils Absolute: 0.1 10*3/uL (ref 0.0–0.1)
Basophils Relative: 0.9 % (ref 0.0–3.0)
Eosinophils Absolute: 0.3 10*3/uL (ref 0.0–0.7)
Eosinophils Relative: 1.8 % (ref 0.0–5.0)
HCT: 44.7 % (ref 36.0–46.0)
Hemoglobin: 14.5 g/dL (ref 12.0–15.0)
Lymphocytes Relative: 33.8 % (ref 12.0–46.0)
Lymphs Abs: 4.9 10*3/uL — ABNORMAL HIGH (ref 0.7–4.0)
MCHC: 32.5 g/dL (ref 30.0–36.0)
MCV: 94.6 fl (ref 78.0–100.0)
Monocytes Absolute: 0.6 10*3/uL (ref 0.1–1.0)
Monocytes Relative: 4.5 % (ref 3.0–12.0)
Neutro Abs: 8.6 10*3/uL — ABNORMAL HIGH (ref 1.4–7.7)
Neutrophils Relative %: 59 % (ref 43.0–77.0)
Platelets: 339 10*3/uL (ref 150.0–400.0)
RBC: 4.73 Mil/uL (ref 3.87–5.11)
RDW: 13.7 % (ref 11.5–15.5)
WBC: 14.5 10*3/uL — ABNORMAL HIGH (ref 4.0–10.5)

## 2019-04-02 LAB — BASIC METABOLIC PANEL
BUN: 19 mg/dL (ref 6–23)
CO2: 27 mEq/L (ref 19–32)
Calcium: 9.3 mg/dL (ref 8.4–10.5)
Chloride: 104 mEq/L (ref 96–112)
Creatinine, Ser: 0.92 mg/dL (ref 0.40–1.20)
GFR: 64.48 mL/min (ref >60.00)
Glucose, Bld: 94 mg/dL (ref 70–99)
Potassium: 3.7 mEq/L (ref 3.5–5.1)
Sodium: 140 mEq/L (ref 135–145)

## 2019-04-02 MED ORDER — LEVOFLOXACIN 750 MG PO TABS: 750.0000 mg | ORAL_TABLET | Freq: Every day | ORAL | 0 refills | Status: DC

## 2019-04-02 MED ORDER — HYDROCODONE-HOMATROPINE 5-1.5 MG/5ML PO SYRP: 5.0000 mL | ORAL_SOLUTION | Freq: Three times a day (TID) | ORAL | 0 refills | Status: DC | PRN

## 2019-04-02 NOTE — Telephone Encounter (Signed)
Updated patient on her lab and chest xray. She does have a slightly elevated white count, possibly due to recent prednisone use.   Due to continued fevers, will treat with Levaquin 750 mg x 5 days.

## 2019-04-02 NOTE — Progress Notes (Signed)
Virtual Visit via Video Note  I connected with Katherine Rosario  on 04/02/19 at  9:00 AM EDT by a video enabled telemedicine application and verified that I am speaking with the correct person using two identifiers.  Location patient: home Location provider:work or home office Persons participating in the virtual visit: patient, provider  I discussed the limitations of evaluation and management by telemedicine and the availability of in person appointments. The patient expressed understanding and agreed to proceed.   HPI: 48 year old female who is being evaluated today for follow-up regarding suspected URI.  She was seen 8 days ago for symptoms that started 9 days prior.  Her symptoms include wheezing, dry cough, shortness of breath, chest congestion, and fever up to 100.4.  She had COVID testing done early that week which was negative.  She was prescribed a 7-day course of doxycycline and a 9-day course of prednisone for suspected URI but would cover for pneumonia.  She has finished her antibiotic and prednisone therapy.  Despite treatment she does not feel any improvement.  Her fever last night was 100 degrees but this morning she is within normal limits.  She continues to feel chest tightness and shortness of breath and has some wheezing but this is not as bad.  Cough continues to be a dry hacking cough and she has severe fatigue.  She has been having to use her inhaler and gets momentary relief when she uses this.   ROS: See pertinent positives and negatives per HPI.  Past Medical History:  Diagnosis Date  . Allergic rhinitis   . Anemia    Heavy menstrual flow/OTC FE BID:    . Asthma   . Fibroids   . GERD (gastroesophageal reflux disease)   . Heart palpitations   . Hyperlipidemia   . Migraines   . OSA (obstructive sleep apnea) 05/06/2014    Past Surgical History:  Procedure Laterality Date  . UTERINE FIBROID EMBOLIZATION      Family History  Problem Relation Age of Onset  .  Hyperlipidemia Mother   . Hypertension Mother   . Colon cancer Father 48  . Hyperlipidemia Father   . Hypertension Father   . Prostate cancer Father 89  . Heart attack Neg Hx   . Stroke Neg Hx       Current Outpatient Medications:  .  albuterol (PROVENTIL HFA;VENTOLIN HFA) 108 (90 BASE) MCG/ACT inhaler, Inhale 2 puffs into the lungs every 4 (four) hours as needed for shortness of breath., Disp: , Rfl:  .  amoxicillin-clavulanate (AUGMENTIN) 875-125 MG tablet, Take 1 tablet by mouth 2 (two) times daily., Disp: 20 tablet, Rfl: 0 .  doxycycline (VIBRAMYCIN) 100 MG capsule, Take 1 capsule (100 mg total) by mouth 2 (two) times daily., Disp: 14 capsule, Rfl: 0 .  fluticasone (FLONASE) 50 MCG/ACT nasal spray, Place 2 sprays into both nostrils daily., Disp: 16 g, Rfl: 6 .  HYDROcodone-homatropine (HYCODAN) 5-1.5 MG/5ML syrup, Take 5 mLs by mouth every 8 (eight) hours as needed for cough., Disp: 120 mL, Rfl: 0 .  omeprazole (PRILOSEC) 20 MG capsule, Take 1 capsule (20 mg total) by mouth daily., Disp: 90 capsule, Rfl: 3 .  ondansetron (ZOFRAN ODT) 4 MG disintegrating tablet, Take 1 tablet (4 mg total) by mouth every 8 (eight) hours as needed for nausea or vomiting., Disp: 8 tablet, Rfl: 0 .  predniSONE (DELTASONE) 10 MG tablet, 40 mg x 3 days, 20 mg x 3 days, 10 mg x 3 days, Disp: 21 tablet,  Rfl: 0 .  SUMAtriptan (IMITREX) 100 MG tablet, I @ ONSET of migraine;repeat in 2 hours if needed, Disp: 27 tablet, Rfl: 2  EXAM:  VITALS per patient if applicable:  GENERAL: alert, oriented, appears well and in no acute distress  HEENT: atraumatic, conjunttiva clear, no obvious abnormalities on inspection of external nose and ears  NECK: normal movements of the head and neck  LUNGS: on inspection no signs of respiratory distress, breathing rate appears normal, no obvious gross SOB, gasping or wheezing  CV: no obvious cyanosis  MS: moves all visible extremities without noticeable  abnormality  PSYCH/NEURO: pleasant and cooperative, no obvious depression or anxiety, speech and thought processing grossly intact  ASSESSMENT AND PLAN:  Discussed the following assessment and plan:  1. Upper respiratory tract infection, unspecified type -Have her come in for blood work and a chest x-ray.  Possible atypical pneumonia versus acute asthma exacerbation.  This wait until labs and chest x-ray come back before implementing plan of care. - DG Chest 2 View; Future - CBC with Differential/Platelet; Future - Basic Metabolic Panel; Future   I discussed the assessment and treatment plan with the patient. The patient was provided an opportunity to ask questions and all were answered. The patient agreed with the plan and demonstrated an understanding of the instructions.   The patient was advised to call back or seek an in-person evaluation if the symptoms worsen or if the condition fails to improve as anticipated.   Dorothyann Peng, NP

## 2019-04-03 ENCOUNTER — Encounter: Payer: Self-pay | Admitting: Adult Health

## 2019-04-08 ENCOUNTER — Encounter: Payer: Self-pay | Admitting: Adult Health

## 2019-04-08 NOTE — Telephone Encounter (Signed)
I called pt to get her scheduled for a In office visit and she was coughing her head off and stated she has a low grade fever, back ache, chest congestion and runny nose.  Pt state that she wants to come in the office like Tommi Rumps said b/c she came in and had a chest xray and labs.  I explained to the pt that with the symptoms that she has we are not allowing pt to come in to the office pt decline a virtual visit.  Per Misty: [04/08/2019 5:12 PM]  Miles Costain:   Let her know I will call her back tomorrow.  Cory on a doxy.  Need to ask him some questions Send me a message to tell me to call her    Copied from [place] [phone]. Topic: General - Other >> Apr 08, 2019  1:58 PM Wynetta Emery, [place] C wrote: Reason for CRM: pt called in to schedule a follow up apt with PCP from having her labs.   Please assist. >> Apr 08, 2019  2:08 PM Cox, Melburn Hake, CMA wrote: Per Rojelio Brenner pt needs in-office appt.

## 2019-04-09 ENCOUNTER — Other Ambulatory Visit: Payer: Self-pay

## 2019-04-09 ENCOUNTER — Encounter: Payer: Self-pay | Admitting: Adult Health

## 2019-04-09 ENCOUNTER — Ambulatory Visit (INDEPENDENT_AMBULATORY_CARE_PROVIDER_SITE_OTHER): Payer: 59 | Admitting: Adult Health

## 2019-04-09 ENCOUNTER — Other Ambulatory Visit: Payer: Self-pay | Admitting: Adult Health

## 2019-04-09 VITALS — BP 108/70 | Temp 98.9°F

## 2019-04-09 DIAGNOSIS — Z20822 Contact with and (suspected) exposure to covid-19: Secondary | ICD-10-CM

## 2019-04-09 DIAGNOSIS — K624 Stenosis of anus and rectum: Secondary | ICD-10-CM

## 2019-04-09 DIAGNOSIS — R509 Fever, unspecified: Secondary | ICD-10-CM

## 2019-04-09 DIAGNOSIS — R062 Wheezing: Secondary | ICD-10-CM

## 2019-04-09 DIAGNOSIS — R05 Cough: Secondary | ICD-10-CM | POA: Diagnosis not present

## 2019-04-09 DIAGNOSIS — R059 Cough, unspecified: Secondary | ICD-10-CM

## 2019-04-09 MED ORDER — IPRATROPIUM-ALBUTEROL 0.5-2.5 (3) MG/3ML IN SOLN: 3.0000 mL | Freq: Once | RESPIRATORY_TRACT | Status: DC

## 2019-04-09 MED ORDER — PREDNISONE 50 MG PO TABS: ORAL_TABLET | ORAL | 0 refills | Status: DC

## 2019-04-09 MED ORDER — HYDROCODONE-HOMATROPINE 5-1.5 MG/5ML PO SYRP: 5.0000 mL | ORAL_SOLUTION | Freq: Three times a day (TID) | ORAL | 0 refills | Status: DC | PRN

## 2019-04-09 NOTE — Telephone Encounter (Signed)
Pt will be seen today at 4 PM in office by [facility].  Nothing further needed.

## 2019-04-09 NOTE — Progress Notes (Addendum)
Subjective:    Patient ID: Katherine Rosario, female    DOB: [birth year], 48 y.o.   MRN: [id number]  HPI 48 year old female who  has a past medical history of Allergic rhinitis, Anemia, Asthma, Fibroids, GERD (gastroesophageal reflux disease), Heart palpitations, Hyperlipidemia, Migraines, and OSA (obstructive sleep apnea) (05/06/2014).  She presents to the office today for follow-up regarding continued URI-like symptoms.  She was originally seen on 7/21 to her symptoms started 9 days prior.  At this time her symptoms included expiratory wheezing, dry hacking cough, shortness of breath, chest congestion, and fever up to 100.4.  She had COVID testing done prior to this visit and her results came back negative.  She was started on doxycycline and a course of prednisone for suspected URI and to cover for possible pneumonia  She was then seen 1 week later after she finished her antibiotic therapy, despite treatment she did not feel any improvement.  She continued to have a and on again and off again fever up to 100 degrees.  She also continued to feel chest tightness, shortness of breath and had some wheezing but was not as bad as the previous week.  Her cough continue to be a dry hacking cough.  At this time her chest x-ray showed no active cardiopulmonary disease and blood work including CBC and BMP were normal except for an elevated white count of 14 which was thought to be caused by recent prednisone therapy.  He was started on 5-day course of Levaquin for possible antibiotic failure.  Today on reevaluation she reports that she continues to have chest tightness, a dry hacking cough, and on again off again fevers up to 99 degrees.  The coughing has caused her to have some right sided chest wall pain.  Having a hard time sleeping due to cough but when she does take hydrocodone and she is able to get some sleep.  Additionally she continues to use her inhaler which provides momentary relief.  She denies  chills, diarrhea, nausea, vomiting, or abdominal pain  She does not feel like this is a typical asthma exacerbation  No one at home is sick   Review of Systems See HPI   Past Medical History:  Diagnosis Date  . Allergic rhinitis   . Anemia    Heavy menstrual flow/OTC FE BID:    . Asthma   . Fibroids   . GERD (gastroesophageal reflux disease)   . Heart palpitations   . Hyperlipidemia   . Migraines   . OSA (obstructive sleep apnea) 05/06/2014    Social History   Socioeconomic History  . Marital status: Married    Spouse name: Not on file  . Number of children: Not on file  . Years of education: Not on file  . Highest education level: Not on file  Occupational History  . Occupation: Medical illustrator: [employer]  Social Needs  . Financial resource strain: Not on file  . Food insecurity    Worry: Not on file    Inability: Not on file  . Transportation needs    Medical: Not on file    Non-medical: Not on file  Tobacco Use  . Smoking status: Never Smoker  . Smokeless tobacco: Current User    Types: Snuff  Substance and Sexual Activity  . Alcohol use: Yes    Alcohol/week: 0.0 standard drinks    Comment: occ  . Drug use: No  . Sexual activity: Never  Lifestyle  . Physical activity    Days per week: Not on file    Minutes per session: Not on file  . Stress: Not on file  Relationships  . Social Herbalist on phone: Not on file    Gets together: Not on file    Attends religious service: Not on file    Active member of club or organization: Not on file    Attends meetings of clubs or organizations: Not on file    Relationship status: Not on file  . Intimate partner violence    Fear of current or ex partner: Not on file    Emotionally abused: Not on file    Physically abused: Not on file    Forced sexual activity: Not on file  Other Topics Concern  . 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      Pt lives with wife, in a one story home. Has some college education.       She likes to hike and swim.     Past Surgical History:  Procedure Laterality Date  . UTERINE FIBROID EMBOLIZATION      Family History  Problem Relation Age of Onset  . Hyperlipidemia Mother   . Hypertension Mother   . Colon cancer Father 8  . Hyperlipidemia Father   . Hypertension Father   . Prostate cancer Father 31  . Heart attack Neg Hx   . Stroke Neg Hx     No Known Allergies  Current Outpatient Medications on File Prior to Visit  Medication Sig Dispense Refill  . albuterol (PROVENTIL HFA;VENTOLIN HFA) 108 (90 BASE) MCG/ACT inhaler Inhale 2 puffs into the lungs every 4 (four) hours as needed for shortness of breath.    . fluticasone (FLONASE) 50 MCG/ACT nasal spray Place 2 sprays into both nostrils daily. 16 g 6  . omeprazole (PRILOSEC) 20 MG capsule Take 1 capsule (20 mg total) by mouth daily. 90 capsule 3  . SUMAtriptan (IMITREX) 100 MG tablet I @ ONSET of migraine;repeat in 2 hours if needed 27 tablet 2   No current facility-administered medications on file prior to visit.     BP 108/70   Temp 98.9 F (37.2 C)   SpO2 97%       Objective:   Physical Exam Vitals signs and nursing note reviewed.  Constitutional:      Appearance: Normal appearance.  HENT:     Right Ear: There is no impacted cerumen.  Cardiovascular:     Rate and Rhythm: Normal rate and regular rhythm.     Pulses: Normal pulses.     Heart sounds: Normal heart sounds.  Pulmonary:     Effort: Pulmonary effort is normal.     Breath sounds: No stridor. Wheezing (trace throughout ) present. No rhonchi or rales.  Chest:     Chest wall: Tenderness (left sided chest wall tenderness with palpation ) present.  Abdominal:     General: Abdomen is flat. Bowel sounds are normal. There is no distension.     Palpations: Abdomen is soft. There is no mass.     Tenderness: There is no  abdominal tenderness. There is no right CVA tenderness, left CVA tenderness, guarding or rebound.     Hernia: No hernia is present.  Musculoskeletal: Normal range of motion.        General: No swelling, tenderness, deformity or signs of injury.  Right lower leg: No edema.     Left lower leg: No edema.  Skin:    General: Skin is warm and dry.     Capillary Refill: Capillary refill takes less than 2 seconds.  Neurological:     General: No focal deficit present.     Mental Status: She is alert and oriented to person, place, and time.  Psychiatric:        Mood and Affect: Mood normal.        Behavior: Behavior normal.        Thought Content: Thought content normal.        Judgment: Judgment normal.       Assessment & Plan:   1. Fever of unknown origin -Continue to be unknown cause.  Luckily COVID screening was negative..  We will do lab work get chest CT for further evaluation - CBC with Differential/Platelet - Sedimentation Rate - C-reactive Protein - Lactate Dehydrogenase - Rheumatoid Factor - ANA - CT Chest Wo Contrast; Future  2. Cough  - ipratropium-albuterol (DUONEB) 0.5-2.5 (3) MG/3ML nebulizer solution 3 mL - CT Chest Wo Contrast; Future - predniSONE (DELTASONE) 50 MG tablet; Take one tab daily  Dispense: 5 tablet; Refill: 0 - HYDROcodone-homatropine (HYCODAN) 5-1.5 MG/5ML syrup; Take 5 mLs by mouth every 8 (eight) hours as needed for cough.  Dispense: 120 mL; Refill: 0  3. Wheezing - ipratropium-albuterol (DUONEB) 0.5-2.5 (3) MG/3ML nebulizer solution 3 mL - CT Chest Wo Contrast; Future - predniSONE (DELTASONE) 50 MG tablet; Take one tab daily  Dispense: 5 tablet; Refill: 0 -Patient endorsed improvement after DuoNeb.  Upon exam wheezing had resolved that appeared that she was able to move oxygen easier  Dorothyann Peng, NP

## 2019-04-10 LAB — CBC WITH DIFFERENTIAL/PLATELET
Basophils Absolute: 0 10*3/uL (ref 0.0–0.1)
Basophils Relative: 0.4 % (ref 0.0–3.0)
Eosinophils Absolute: 0.4 10*3/uL (ref 0.0–0.7)
Eosinophils Relative: 4 % (ref 0.0–5.0)
HCT: 45.2 % (ref 36.0–46.0)
Hemoglobin: 14.7 g/dL (ref 12.0–15.0)
Lymphocytes Relative: 33.3 % (ref 12.0–46.0)
Lymphs Abs: 3.1 10*3/uL (ref 0.7–4.0)
MCHC: 32.6 g/dL (ref 30.0–36.0)
MCV: 94.2 fl (ref 78.0–100.0)
Monocytes Absolute: 0.7 10*3/uL (ref 0.1–1.0)
Monocytes Relative: 7.2 % (ref 3.0–12.0)
Neutro Abs: 5 10*3/uL (ref 1.4–7.7)
Neutrophils Relative %: 55.1 % (ref 43.0–77.0)
Platelets: 314 10*3/uL (ref 150.0–400.0)
RBC: 4.8 Mil/uL (ref 3.87–5.11)
RDW: 13.2 % (ref 11.5–15.5)
WBC: 9.2 10*3/uL (ref 4.0–10.5)

## 2019-04-10 LAB — C-REACTIVE PROTEIN: CRP: <1 mg/dL (ref 0.5–20.0)

## 2019-04-10 LAB — NOVEL CORONAVIRUS, NAA: SARS-CoV-2, NAA: NOT DETECTED

## 2019-04-10 LAB — SEDIMENTATION RATE: Sed Rate: 24 mm/hr (ref 0–30)

## 2019-04-11 LAB — RHEUMATOID FACTOR: Rheumatoid fact SerPl-aCnc: <14 IU/mL (ref <14)

## 2019-04-11 LAB — ANA: Anti Nuclear Antibody (ANA): NEGATIVE

## 2019-04-11 LAB — LACTATE DEHYDROGENASE: LDH: 172 U/L (ref 120–250)

## 2019-05-31 ENCOUNTER — Encounter: Payer: Self-pay | Admitting: Adult Health

## 2019-06-24 ENCOUNTER — Encounter: Payer: Self-pay | Admitting: Adult Health

## 2019-06-24 ENCOUNTER — Other Ambulatory Visit: Payer: Self-pay

## 2019-06-24 ENCOUNTER — Ambulatory Visit (INDEPENDENT_AMBULATORY_CARE_PROVIDER_SITE_OTHER): Payer: 59

## 2019-06-24 ENCOUNTER — Ambulatory Visit (INDEPENDENT_AMBULATORY_CARE_PROVIDER_SITE_OTHER): Payer: 59 | Admitting: Adult Health

## 2019-06-24 VITALS — BP 130/80 | HR 93 | Temp 98.0°F | Wt 206.8 lb

## 2019-06-24 DIAGNOSIS — M79671 Pain in right foot: Secondary | ICD-10-CM | POA: Diagnosis not present

## 2019-06-24 MED ORDER — TRAMADOL HCL 50 MG PO TABS: 50.0000 mg | ORAL_TABLET | Freq: Three times a day (TID) | ORAL | 0 refills | Status: AC | PRN

## 2019-06-24 NOTE — Progress Notes (Signed)
Subjective:    Patient ID: Katherine Rosario, female    DOB: May 23, 1971, 48 y.o.   MRN: [id number]  HPI  48 year old female who  has a past medical history of Allergic rhinitis, Anemia, Asthma, Fibroids, GERD (gastroesophageal reflux disease), Heart palpitations, Hyperlipidemia, Migraines, and OSA (obstructive sleep apnea) (05/06/2014).  She presents to the office today for the complaint of right foot pain and swelling.  Per patient report she twisted her ankle stepping off a log about 6 weeks ago.  [facility] that the x-ray that they did there did not show any fractures.  6 weeks out she continues to have swelling and pain along the lateral aspect of the right foot.  She denies discoloration, redness, or warmth.  Pain is worse with ambulation.  She has been using Tylenol and Motrin without relief as well as using an ankle brace for stability.  Review of Systems See HPI   Past Medical History:  Diagnosis Date  . Allergic rhinitis   . Anemia    Heavy menstrual flow/OTC FE BID:    . Asthma   . Fibroids   . GERD (gastroesophageal reflux disease)   . Heart palpitations   . Hyperlipidemia   . Migraines   . OSA (obstructive sleep apnea) 05/06/2014    Social History   Socioeconomic History  . Marital status: Married    Spouse name: Not on file  . Number of children: Not on file  . Years of education: Not on file  . Highest education level: Not on file  Occupational History  . Occupation: Medical illustrator: [employer]  Social Needs  . Financial resource strain: Not on file  . Food insecurity    Worry: Not on file    Inability: Not on file  . Transportation needs    Medical: Not on file    Non-medical: Not on file  Tobacco Use  . Smoking status: Never Smoker  . Smokeless tobacco: Current User    Types: Snuff  Substance and Sexual Activity  . Alcohol use: Yes    Alcohol/week: 0.0 standard drinks    Comment: occ  . Drug  use: No  . Sexual activity: Never  Lifestyle  . Physical activity    Days per week: Not on file    Minutes per session: Not on file  . Stress: Not on file  Relationships  . Social Herbalist on phone: Not on file    Gets together: Not on file    Attends religious service: Not on file    Active member of club or organization: Not on file    Attends meetings of clubs or organizations: Not on file    Relationship status: Not on file  . Intimate partner violence    Fear of current or ex partner: Not on file    Emotionally abused: Not on file    Physically abused: Not on file    Forced sexual activity: Not on file  Other Topics Concern  . 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      Pt lives with wife, in a one story home. Has some college education.       She likes to hike and swim.     Past Surgical History:  Procedure Laterality Date  . UTERINE FIBROID EMBOLIZATION  Family History  Problem Relation Age of Onset  . Hyperlipidemia Mother   . Hypertension Mother   . Colon cancer Father 77  . Hyperlipidemia Father   . Hypertension Father   . Prostate cancer Father 51  . Heart attack Neg Hx   . Stroke Neg Hx     No Known Allergies  Current Outpatient Medications on File Prior to Visit  Medication Sig Dispense Refill  . albuterol (PROVENTIL HFA;VENTOLIN HFA) 108 (90 BASE) MCG/ACT inhaler Inhale 2 puffs into the lungs every 4 (four) hours as needed for shortness of breath.    . fluticasone (FLONASE) 50 MCG/ACT nasal spray Place 2 sprays into both nostrils daily. 16 g 6  . HYDROcodone-homatropine (HYCODAN) 5-1.5 MG/5ML syrup Take 5 mLs by mouth every 8 (eight) hours as needed for cough. 120 mL 0  . omeprazole (PRILOSEC) 20 MG capsule Take 1 capsule (20 mg total) by mouth daily. 90 capsule 3  . predniSONE (DELTASONE) 50 MG tablet Take one tab daily 5 tablet 0  . SUMAtriptan (IMITREX)  100 MG tablet I @ ONSET of migraine;repeat in 2 hours if needed 27 tablet 2   Current Facility-Administered Medications on File Prior to Visit  Medication Dose Route Frequency Provider Last Rate Last Dose  . ipratropium-albuterol (DUONEB) 0.5-2.5 (3) MG/3ML nebulizer solution 3 mL  3 mL Nebulization Once Gilma Bessette, NP        BP 130/80 (BP Location: Left Arm, Patient Position: Sitting, Cuff Size: Normal)   Pulse 93   Temp 98 F (36.7 C) (Temporal)   Wt 206 lb 12.8 oz (93.8 kg)   SpO2 96%   BMI 37.82 kg/m       Objective:   Physical Exam Vitals signs and nursing note reviewed.  Constitutional:      Appearance: Normal appearance.  Musculoskeletal: Normal range of motion.        General: Swelling and tenderness present. No deformity or signs of injury.     Right lower leg: No edema.     Left lower leg: No edema.     Right foot: Normal range of motion. No deformity, bunion or foot drop.       Feet:  Feet:     Right foot:     Skin integrity: Skin integrity normal.     Toenail Condition: Right toenails are normal.  Skin:    General: Skin is warm and dry.     Capillary Refill: Capillary refill takes less than 2 seconds.  Neurological:     General: No focal deficit present.     Mental Status: She is alert and oriented to person, place, and time.  Psychiatric:        Mood and Affect: Mood normal.        Behavior: Behavior normal.        Thought Content: Thought content normal.        Judgment: Judgment normal.       Assessment & Plan:

## 2019-06-28 ENCOUNTER — Encounter: Payer: Self-pay | Admitting: Adult Health

## 2019-07-09 ENCOUNTER — Encounter: Payer: Self-pay | Admitting: Family Medicine

## 2019-07-09 ENCOUNTER — Encounter: Payer: Self-pay | Admitting: Adult Health

## 2019-07-09 ENCOUNTER — Other Ambulatory Visit: Payer: Self-pay

## 2019-07-09 ENCOUNTER — Ambulatory Visit (INDEPENDENT_AMBULATORY_CARE_PROVIDER_SITE_OTHER): Payer: 59 | Admitting: Family Medicine

## 2019-07-09 ENCOUNTER — Ambulatory Visit: Payer: Self-pay

## 2019-07-09 DIAGNOSIS — M79671 Pain in right foot: Secondary | ICD-10-CM

## 2019-07-09 NOTE — Patient Instructions (Signed)
  Bones:  - Vitamin D3:  5,000 IU daily  - Magnesium:  200-400 mg daily  - Vitamin K2:  100 mcg daily

## 2019-07-09 NOTE — Progress Notes (Signed)
Office Visit Note   Patient: Katherine Rosario           Date of Birth: [birth year]           MRN: [id number] Visit Date: 07/09/2019 Requested by: Dorothyann Peng, NP [place],  [place] [address] PCP: Dorothyann Peng, NP  Subjective: Chief Complaint  Patient presents with  . Right Foot - Pain    Pain in the foot since Aug. 25th, 2020. Stepped on a stump in the park, while training her dog. Still has sharp pain in the Achilles area at times, and burning pain in the lateral foot at times. In ASO brace - unsure if it helps.    HPI: She is here with right foot and ankle pain.  On August 25 she stepped on a stump and inverted her ankle, fell to the ground.  Immediate severe pain, unable to bear weight right away.  Over the next 30 minutes or so she was able to get up, but she continued to have pain so she went to an urgent care where x-rays were read as negative for fracture.  She has been wearing the ASO brace, her pain has persisted so she went to her PCP who took some x-rays a couple weeks ago which again were negative for fracture.  Pain is all on the lateral aspect of her ankle.  She seems to be getting somewhat better.  Denies any previous problems with her ankle, she has a history of a fracture of her finger but no fractures of her lower extremities.  She is never had a bone density test.              ROS: No fevers or chills.  All other systems were reviewed and are negative.  Objective: Vital Signs: There were no vitals taken for this visit.  Physical Exam:  General:  Alert and oriented, in no acute distress. Pulm:  Breathing unlabored. Psy:  Normal mood, congruent affect. Skin: No bruising. Right ankle: No tenderness at the proximal fibula, she is exquisitely tender at the distal fibula about a centimeter from the tip.  She is also moderately tender posteriorly along the peroneal tendons but not along the tendons distally.  Mild tenderness over the Achilles, ankle  ligaments are stable and tendon function is intact with resisted strength testing.  No subluxation of the peroneal tendons detected.  Imaging: Previous x-rays of her foot were reviewed and are negative for obvious fracture.  Limited diagnostic ultrasound right ankle: I believe she has a nondisplaced fracture of the distal fibula.  No hyperemia on power Doppler imaging.  Peroneal tendons have normal appearance as does the Achilles.  Assessment & Plan: 1.  Persistent right ankle and foot pain, suspect due to slowly healing distal fibula fracture which is nondisplaced. -Continue with ASO brace, gentle strengthening exercises using Thera-Band.  Take vitamin D3, vitamin K2 and magnesium for bone strength. -Follow-up in 3 weeks for recheck, if still hurting we will obtain three-view ankle x-rays.  If she is pain-free she will follow-up as needed.     Procedures: No procedures performed  No notes on file     PMFS History: Patient Active Problem List   Diagnosis Date Noted  . Dysfunction of left eustachian tube 03/12/2017  . Left ear impacted cerumen 03/12/2017  . Bilateral edema of lower extremity 09/01/2015  . Migraine with aura and without status migrainosus, not intractable 07/12/2015  . Migraine 04/27/2015  . Palpitations 06/23/2014  .  Abnormal ECG 06/23/2014  . Syncope 06/23/2014  . Orthostatic hypotension 06/23/2014  . OSA (obstructive sleep apnea) 05/06/2014  . Other malaise and fatigue 05/02/2014  . Nonspecific elevation of levels of transaminase or lactic acid dehydrogenase (LDH) 05/02/2014  . Acute upper respiratory infections of unspecified site 11/19/2013  . Obese 08/25/2013  . Allergic rhinitis 08/25/2013  . Anemia 08/23/2012  . Heart palpitations   . Asthma   . GERD (gastroesophageal reflux disease)   . Hyperlipidemia   . Diabetes mellitus, type II ([place]) 05/16/2004  . Intractable migraine without aura 01/18/1999  . Headache 12/24/1998   Past Medical History:   Diagnosis Date  . Allergic rhinitis   . Anemia    Heavy menstrual flow/OTC FE BID:    . Asthma   . Diabetes mellitus, type II ([place]) 05/16/2004  . Fibroids   . GERD (gastroesophageal reflux disease)   . Heart palpitations   . Hyperlipidemia   . Migraines   . OSA (obstructive sleep apnea) 05/06/2014    Family History  Problem Relation Age of Onset  . Hyperlipidemia Mother   . Hypertension Mother   . Colon cancer Father 18  . Hyperlipidemia Father   . Hypertension Father   . Prostate cancer Father 49  . Heart attack Neg Hx   . Stroke Neg Hx     Past Surgical History:  Procedure Laterality Date  . UTERINE FIBROID EMBOLIZATION     Social History   Occupational History  . Occupation: Medical illustrator: [employer]  Tobacco Use  . Smoking status: Never Smoker  . Smokeless tobacco: Current User    Types: Snuff  Substance and Sexual Activity  . Alcohol use: Yes    Alcohol/week: 0.0 standard drinks    Comment: occ  . Drug use: No  . Sexual activity: Never

## 2019-07-11 ENCOUNTER — Telehealth: Payer: Self-pay | Admitting: Adult Health

## 2019-07-11 NOTE — Telephone Encounter (Signed)
Copied from [place] [id number]. Topic: General - Other >> Jul 11, 2019 11:24 AM Keene Breath wrote: Reason for CRM: Called to ask the doctor some questions regarding the patient.  CB# [phone]

## 2019-07-15 ENCOUNTER — Encounter: Payer: Self-pay | Admitting: Family Medicine

## 2019-07-15 ENCOUNTER — Encounter: Payer: Self-pay | Admitting: Adult Health

## 2019-07-15 NOTE — Telephone Encounter (Signed)
Spoke to Katherine Rosario.  She states she needs the note to say that she may return to work without restrictions.  Calling pt to tell her to pick up new note at the front desk.

## 2019-07-16 NOTE — Telephone Encounter (Signed)
Pt viewed mychart message and will pick up letter. Nothing further needed.

## 2019-09-26 ENCOUNTER — Other Ambulatory Visit: Payer: Self-pay

## 2019-09-26 ENCOUNTER — Encounter: Payer: Self-pay | Admitting: Adult Health

## 2019-09-26 ENCOUNTER — Ambulatory Visit (INDEPENDENT_AMBULATORY_CARE_PROVIDER_SITE_OTHER): Payer: Self-pay | Admitting: Adult Health

## 2019-09-26 VITALS — BP 160/100 | HR 102 | Temp 98.1°F | Ht 62.0 in | Wt 207.0 lb

## 2019-09-26 DIAGNOSIS — J4541 Moderate persistent asthma with (acute) exacerbation: Secondary | ICD-10-CM

## 2019-09-26 DIAGNOSIS — F4321 Adjustment disorder with depressed mood: Secondary | ICD-10-CM

## 2019-09-26 MED ORDER — CITALOPRAM HYDROBROMIDE 10 MG PO TABS: 10.0000 mg | ORAL_TABLET | Freq: Every day | ORAL | 0 refills | Status: DC

## 2019-09-26 MED ORDER — PREDNISONE 10 MG PO TABS: ORAL_TABLET | ORAL | 0 refills | Status: DC

## 2019-09-26 MED ORDER — HYDROCODONE-HOMATROPINE 5-1.5 MG/5ML PO SYRP: 5.0000 mL | ORAL_SOLUTION | Freq: Three times a day (TID) | ORAL | 0 refills | Status: DC | PRN

## 2019-09-26 NOTE — Progress Notes (Signed)
Subjective:    Patient ID: Katherine Rosario, female    DOB: Jun 20, 1971, 49 y.o.   MRN: [id number]  HPI 49 year old female who  has a past medical history of Allergic rhinitis, Anemia, Asthma, Diabetes mellitus, type II ([place]) (05/16/2004), Fibroids, GERD (gastroesophageal reflux disease), Heart palpitations, Hyperlipidemia, Migraines, and OSA (obstructive sleep apnea) (05/06/2014).  She presents to the office today for an acute asthma exacerbation.  In the past her asthma has been well controlled but over the past month she is been experiencing shortness of breath, chest tightness, wheezing, and a dry cough.  She has been using her inhalers as directed but has not found much relief from this.  She also reports that over the last month or so she "has not been doing very well".  She recently had a family member die and she and her partner broke up.  Patient reports that she works night shift and came home early to find her partner in bed with someone else.  Partner has moved out of the house and now Katherine Rosario is left with 8 pets to care for.  She has not felt like getting out of bed, she is not eating, she is not showering.  She has a hard time going to work and sometimes does not go because she cannot get out of bed.  She denies suicidal ideation but feels as though she needs to start some medication for depression.   Review of Systems See HPI   Past Medical History:  Diagnosis Date  . Allergic rhinitis   . Anemia    Heavy menstrual flow/OTC FE BID:    . Asthma   . Diabetes mellitus, type II ([place]) 05/16/2004  . Fibroids   . GERD (gastroesophageal reflux disease)   . Heart palpitations   . Hyperlipidemia   . Migraines   . OSA (obstructive sleep apnea) 05/06/2014    Social History   Socioeconomic History  . Marital status: Married    Spouse name: Not on file  . Number of children: Not on file  . Years of education: Not on file  . Highest education level: Not on file  Occupational History    . Occupation: Medical illustrator: [employer]  Tobacco Use  . Smoking status: Never Smoker  . Smokeless tobacco: Current User    Types: Snuff  Substance and Sexual Activity  . Alcohol use: Yes    Alcohol/week: 0.0 standard drinks    Comment: occ  . Drug use: No  . Sexual activity: Never  Other Topics Concern  . Not on file  Social History Narrative   married to female partner Katherine Rosario 12/2012, 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      Pt lives with wife, in a one story home. Has some college education.       She likes to hike and swim.    Social Determinants of Health   Financial Resource Strain:   . Difficulty of Paying Living Expenses: Not on file  Food Insecurity:   . Worried About Charity fundraiser in the Last Year: Not on file  . Ran Out of Food in the Last Year: Not on file  Transportation Needs:   . Lack of Transportation (Medical): Not on file  . Lack of Transportation (Non-Medical): Not on file  Physical Activity:   . Days of Exercise per Week: Not on file  . Minutes of Exercise per Session: Not on file  Stress:   .  Feeling of Stress : Not on file  Social Connections:   . Frequency of Communication with Friends and Family: Not on file  . Frequency of Social Gatherings with Friends and Family: Not on file  . Attends Religious Services: Not on file  . Active Member of Clubs or Organizations: Not on file  . Attends Archivist Meetings: Not on file  . Marital Status: Not on file  Intimate Partner Violence:   . Fear of Current or Ex-Partner: Not on file  . Emotionally Abused: Not on file  . Physically Abused: Not on file  . Sexually Abused: Not on file    Past Surgical History:  Procedure Laterality Date  . UTERINE FIBROID EMBOLIZATION      Family History  Problem Relation Age of Onset  . Hyperlipidemia Mother   . Hypertension Mother   . Colon cancer Father 58  . Hyperlipidemia Father   . Hypertension Father   .  Prostate cancer Father 89  . Heart attack Neg Hx   . Stroke Neg Hx     No Known Allergies  Current Outpatient Medications on File Prior to Visit  Medication Sig Dispense Refill  . albuterol (PROVENTIL HFA;VENTOLIN HFA) 108 (90 BASE) MCG/ACT inhaler Inhale 2 puffs into the lungs every 4 (four) hours as needed for shortness of breath.    . fluticasone (FLONASE) 50 MCG/ACT nasal spray Place 2 sprays into both nostrils daily. 16 g 6  . omeprazole (PRILOSEC) 20 MG capsule Take 1 capsule (20 mg total) by mouth daily. 90 capsule 3  . SUMAtriptan (IMITREX) 100 MG tablet I @ ONSET of migraine;repeat in 2 hours if needed 27 tablet 2   Current Facility-Administered Medications on File Prior to Visit  Medication Dose Route Frequency Provider Last Rate Last Admin  . ipratropium-albuterol (DUONEB) 0.5-2.5 (3) MG/3ML nebulizer solution 3 mL  3 mL Nebulization Once Chirstina Haan, NP        BP (!) 160/100   Pulse (!) 102   Temp 98.1 F (36.7 C) (Other (Comment))   Ht 5\' 2"  (1.575 m)   Wt 207 lb (93.9 kg)   SpO2 97%   BMI 37.86 kg/m       Objective:   Physical Exam Vitals and nursing note reviewed.  Cardiovascular:     Rate and Rhythm: Normal rate and regular rhythm.     Pulses: Normal pulses.     Heart sounds: Normal heart sounds.  Pulmonary:     Effort: Pulmonary effort is normal.     Breath sounds: Decreased air movement (bilateral bases) present. Wheezing (throughout) present.  Abdominal:     General: Abdomen is flat.     Palpations: Abdomen is soft.  Skin:    General: Skin is warm and dry.     Capillary Refill: Capillary refill takes less than 2 seconds.  Neurological:     General: No focal deficit present.     Mental Status: She is oriented to person, place, and time.  Psychiatric:        Mood and Affect: Mood normal. Affect is tearful.        Behavior: Behavior normal.        Thought Content: Thought content normal.        Judgment: Judgment normal.       Assessment &  Plan:  1. Moderate persistent asthma with exacerbation - predniSONE (DELTASONE) 10 MG tablet; 40 mg x 3 days, 20 mg x 3 days, 10 mg x 3 days  Dispense: 21 tablet; Refill: 0 - HYDROcodone-homatropine (HYCODAN) 5-1.5 MG/5ML syrup; Take 5 mLs by mouth every 8 (eight) hours as needed for cough.  Dispense: 120 mL; Refill: 0 - Follow up if no improvement  2. Situational depression -We will start on Celexa 10 mg.  Side effects reviewed.  If she follow-up suicidal ideation needs to go to the emergency room. -Follow-up in 4 weeks or sooner if needed - citalopram (CELEXA) 10 MG tablet; Take 1 tablet (10 mg total) by mouth daily.  Dispense: 30 tablet; Refill: 0

## 2019-09-26 NOTE — Patient Instructions (Signed)
It was great seeing you today and I am sorry about everything you are going through   I am going to start you on Celexa 10 mg. Take this daily ( either morning or evening).   I have also started you on Prednisone for your acute flare.   Please follow up in 4 weeks or sooner if needed

## 2019-10-06 ENCOUNTER — Encounter: Payer: Self-pay | Admitting: Adult Health

## 2019-10-07 ENCOUNTER — Encounter: Payer: Self-pay | Admitting: Adult Health

## 2019-10-07 ENCOUNTER — Telehealth (INDEPENDENT_AMBULATORY_CARE_PROVIDER_SITE_OTHER): Payer: Self-pay | Admitting: Adult Health

## 2019-10-07 ENCOUNTER — Other Ambulatory Visit: Payer: Self-pay

## 2019-10-07 VITALS — Temp 99.5°F | Ht 62.0 in | Wt 200.0 lb

## 2019-10-07 DIAGNOSIS — R112 Nausea with vomiting, unspecified: Secondary | ICD-10-CM

## 2019-10-07 DIAGNOSIS — R197 Diarrhea, unspecified: Secondary | ICD-10-CM

## 2019-10-07 MED ORDER — ONDANSETRON HCL 4 MG PO TABS: 4.0000 mg | ORAL_TABLET | Freq: Three times a day (TID) | ORAL | 1 refills | Status: DC | PRN

## 2019-10-07 NOTE — Progress Notes (Signed)
Virtual Visit via Video Note  I connected with Florenda Delany on 10/07/19 at  2:30 PM EST by a video enabled telemedicine application and verified that I am speaking with the correct person using two identifiers.  Location patient: home Location provider:work or home office Persons participating in the virtual visit: patient, provider  I discussed the limitations of evaluation and management by telemedicine and the availability of in person appointments. The patient expressed understanding and agreed to proceed.   HPI: 49 year old female who is being evaluated today for an acute issue.  Her symptoms started 6 days ago with vomiting while at work.  She was subsequently sent home.  Two  days ago she started having diarrhea.  Today she had a low-grade fever up to 99 degrees.  So endorses diaphoresis, chills, and fatigue.  He reports that her last episode of vomiting was 3 days ago.  No episodes of diarrhea today she has also not had much oral nutrition besides Pedialyte.  Continues to be nauseous almost constantly  Last tested negative for Covid about 3 weeks ago.  Has not had any recent contact with anyone.  He has not use any over-the-counter medication  ROS: See pertinent positives and negatives per HPI.  Past Medical History:  Diagnosis Date  . Allergic rhinitis   . Anemia    Heavy menstrual flow/OTC FE BID:    . Asthma   . Diabetes mellitus, type II ([place]) 05/16/2004  . Fibroids   . GERD (gastroesophageal reflux disease)   . Heart palpitations   . Hyperlipidemia   . Migraines   . OSA (obstructive sleep apnea) 05/06/2014    Past Surgical History:  Procedure Laterality Date  . UTERINE FIBROID EMBOLIZATION      Family History  Problem Relation Age of Onset  . Hyperlipidemia Mother   . Hypertension Mother   . Colon cancer Father 79  . Hyperlipidemia Father   . Hypertension Father   . Prostate cancer Father 32  . Heart attack Neg Hx   . Stroke Neg Hx        Current  Outpatient Medications:  .  albuterol (PROVENTIL HFA;VENTOLIN HFA) 108 (90 BASE) MCG/ACT inhaler, Inhale 2 puffs into the lungs every 4 (four) hours as needed for shortness of breath., Disp: , Rfl:  .  citalopram (CELEXA) 10 MG tablet, Take 1 tablet (10 mg total) by mouth daily., Disp: 30 tablet, Rfl: 0 .  fluticasone (FLONASE) 50 MCG/ACT nasal spray, Place 2 sprays into both nostrils daily., Disp: 16 g, Rfl: 6 .  HYDROcodone-homatropine (HYCODAN) 5-1.5 MG/5ML syrup, Take 5 mLs by mouth every 8 (eight) hours as needed for cough., Disp: 120 mL, Rfl: 0 .  omeprazole (PRILOSEC) 20 MG capsule, Take 1 capsule (20 mg total) by mouth daily., Disp: 90 capsule, Rfl: 3 .  predniSONE (DELTASONE) 10 MG tablet, 40 mg x 3 days, 20 mg x 3 days, 10 mg x 3 days, Disp: 21 tablet, Rfl: 0 .  SUMAtriptan (IMITREX) 100 MG tablet, I @ ONSET of migraine;repeat in 2 hours if needed, Disp: 27 tablet, Rfl: 2  Current Facility-Administered Medications:  .  ipratropium-albuterol (DUONEB) 0.5-2.5 (3) MG/3ML nebulizer solution 3 mL, 3 mL, Nebulization, Once, Eusevio Schriver, Tommi Rumps, NP  EXAM:  VITALS per patient if applicable:  GENERAL: alert, oriented, appears well and in no acute distress  HEENT: atraumatic, conjunttiva clear, no obvious abnormalities on inspection of external nose and ears  NECK: normal movements of the head and neck  LUNGS: on inspection  no signs of respiratory distress, breathing rate appears normal, no obvious gross SOB, gasping or wheezing  CV: no obvious cyanosis  MS: moves all visible extremities without noticeable abnormality  PSYCH/NEURO: pleasant and cooperative, no obvious depression or anxiety, speech and thought processing grossly intact  ASSESSMENT AND PLAN:  Discussed the following assessment and plan:  Likely viral gastroenteritis.  Will send in Zofran.  Her symptoms seem to be improving she was encouraged to continue with Pedialyte and advance diet as tolerated.  Follow-up they have a  medicine visit in 2 to 3 days if no improvement.    I discussed the assessment and treatment plan with the patient. The patient was provided an opportunity to ask questions and all were answered. The patient agreed with the plan and demonstrated an understanding of the instructions.   The patient was advised to call back or seek an in-person evaluation if the symptoms worsen or if the condition fails to improve as anticipated.   Dorothyann Peng, NP

## 2019-10-09 ENCOUNTER — Encounter: Payer: Self-pay | Admitting: Adult Health

## 2019-10-13 ENCOUNTER — Encounter: Payer: Self-pay | Admitting: Adult Health

## 2019-10-14 NOTE — Telephone Encounter (Signed)
[place] for a work note?

## 2019-10-19 ENCOUNTER — Other Ambulatory Visit: Payer: Self-pay | Admitting: Adult Health

## 2019-10-19 DIAGNOSIS — F4321 Adjustment disorder with depressed mood: Secondary | ICD-10-CM

## 2019-10-21 NOTE — Telephone Encounter (Signed)
This has been taking care of.

## 2019-10-23 ENCOUNTER — Ambulatory Visit: Payer: Self-pay | Admitting: Adult Health

## 2019-10-27 ENCOUNTER — Other Ambulatory Visit: Payer: Self-pay | Admitting: Adult Health

## 2019-10-27 ENCOUNTER — Encounter: Payer: Self-pay | Admitting: Adult Health

## 2019-10-27 DIAGNOSIS — F4321 Adjustment disorder with depressed mood: Secondary | ICD-10-CM

## 2019-10-28 ENCOUNTER — Other Ambulatory Visit: Payer: Self-pay | Admitting: Adult Health

## 2019-10-28 DIAGNOSIS — F4321 Adjustment disorder with depressed mood: Secondary | ICD-10-CM

## 2019-10-28 MED ORDER — CITALOPRAM HYDROBROMIDE 10 MG PO TABS: 10.0000 mg | ORAL_TABLET | Freq: Every day | ORAL | 0 refills | Status: DC

## 2019-12-02 DIAGNOSIS — S51832A Puncture wound without foreign body of left forearm, initial encounter: Secondary | ICD-10-CM | POA: Diagnosis not present

## 2019-12-15 ENCOUNTER — Other Ambulatory Visit: Payer: Self-pay | Admitting: Adult Health

## 2019-12-15 DIAGNOSIS — F4321 Adjustment disorder with depressed mood: Secondary | ICD-10-CM

## 2019-12-16 NOTE — Telephone Encounter (Signed)
Patient is past due for yearly physical.

## 2020-02-16 ENCOUNTER — Other Ambulatory Visit: Payer: Self-pay

## 2020-02-17 ENCOUNTER — Ambulatory Visit (INDEPENDENT_AMBULATORY_CARE_PROVIDER_SITE_OTHER): Payer: BC Managed Care – PPO | Admitting: Adult Health

## 2020-02-17 ENCOUNTER — Encounter: Payer: Self-pay | Admitting: Adult Health

## 2020-02-17 VITALS — BP 122/80 | Temp 97.0°F | Ht 61.0 in | Wt 203.0 lb

## 2020-02-17 DIAGNOSIS — J4520 Mild intermittent asthma, uncomplicated: Secondary | ICD-10-CM

## 2020-02-17 DIAGNOSIS — G43009 Migraine without aura, not intractable, without status migrainosus: Secondary | ICD-10-CM

## 2020-02-17 DIAGNOSIS — F4321 Adjustment disorder with depressed mood: Secondary | ICD-10-CM

## 2020-02-17 DIAGNOSIS — Z23 Encounter for immunization: Secondary | ICD-10-CM

## 2020-02-17 DIAGNOSIS — Z0000 Encounter for general adult medical examination without abnormal findings: Secondary | ICD-10-CM

## 2020-02-17 DIAGNOSIS — J302 Other seasonal allergic rhinitis: Secondary | ICD-10-CM

## 2020-02-17 MED ORDER — CITALOPRAM HYDROBROMIDE 10 MG PO TABS: 10.0000 mg | ORAL_TABLET | Freq: Every day | ORAL | 1 refills | Status: DC

## 2020-02-17 MED ORDER — SUMATRIPTAN SUCCINATE 100 MG PO TABS: ORAL_TABLET | ORAL | 2 refills | Status: DC

## 2020-02-17 MED ORDER — OMEPRAZOLE 20 MG PO CPDR: 20.0000 mg | DELAYED_RELEASE_CAPSULE | Freq: Every day | ORAL | 3 refills | Status: DC

## 2020-02-17 NOTE — Progress Notes (Signed)
Subjective:    Patient ID: Katherine Rosario, female    DOB: 18-Feb-1971, 49 y.o.   MRN: [id number]  HPI  Patient presents for yearly preventative medicine examination. He is a plesasant 49 year old female who  has a past medical history of Allergic rhinitis, Anemia, Asthma, Fibroids, GERD (gastroesophageal reflux disease), Heart palpitations, Hyperlipidemia, Migraines, and OSA (obstructive sleep apnea) (05/06/2014).  Migraine headaches-uses imitrex, migraines come very infrequently  Seasonal allergies-controlled with over-the-counter medications  Mild asthma-uses albuterol inhaler as needed  Depression - takes Celexa 10 mg by mouth daily   All immunizations and health maintenance protocols were reviewed with the patient and needed orders were placed.  Appropriate screening laboratory values were ordered for the patient including screening of hyperlipidemia, renal function and hepatic function.   Medication reconciliation,  past medical history, social history, problem list and allergies were reviewed in detail with the patient  Goals were established with regard to weight loss, exercise, and  diet in compliance with medications Wt Readings from Last 3 Encounters:  02/17/20 203 lb (92.1 kg)  10/07/19 200 lb (90.7 kg)  09/26/19 207 lb (93.9 kg)   Her last colonoscopy was in 2014, she was to return in 5 years due to history of polyps.  She is seen by the dentist and eye doctor on a routine basis and also follows up with her GYN  Review of Systems  Constitutional: Negative.   HENT: Negative.   Eyes: Negative.   Respiratory: Negative.   Cardiovascular: Negative.   Gastrointestinal: Negative.   Endocrine: Negative.   Genitourinary: Negative.   Musculoskeletal: Negative.   Skin: Negative.   Allergic/Immunologic: Negative.   Neurological: Negative.   Hematological: Negative.   Psychiatric/Behavioral: Negative.    Past Medical History:  Diagnosis Date  . Allergic rhinitis   .  Anemia    Heavy menstrual flow/OTC FE BID:    . Asthma   . Fibroids   . GERD (gastroesophageal reflux disease)   . Heart palpitations   . Hyperlipidemia   . Migraines   . OSA (obstructive sleep apnea) 05/06/2014    Social History   Socioeconomic History  . Marital status: Married    Spouse name: Not on file  . Number of children: Not on file  . Years of education: Not on file  . Highest education level: Not on file  Occupational History  . Occupation: Medical illustrator: [employer]  Tobacco Use  . Smoking status: Never Smoker  . Smokeless tobacco: Current User    Types: Snuff  Vaping Use  . Vaping Use: Never used  Substance and Sexual Activity  . Alcohol use: Yes    Alcohol/week: 0.0 standard drinks    Comment: occ  . Drug use: No  . Sexual activity: Never  Other Topics Concern  . 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      Pt lives with wife, in a one story home. Has some college education.       She likes to hike and swim.    Social Determinants of Health   Financial Resource Strain:   . Difficulty of Paying Living Expenses:   Food Insecurity:   . Worried About Charity fundraiser in the Last Year:   . Arboriculturist in the Last Year:   Transportation Needs:   . Film/video editor (Medical):   Marland Kitchen Lack  of Transportation (Non-Medical):   Physical Activity:   . Days of Exercise per Week:   . Minutes of Exercise per Session:   Stress:   . Feeling of Stress :   Social Connections:   . Frequency of Communication with Friends and Family:   . Frequency of Social Gatherings with Friends and Family:   . Attends Religious Services:   . Active Member of Clubs or Organizations:   . Attends Archivist Meetings:   Marland Kitchen Marital Status:   Intimate Partner Violence:   . Fear of Current or Ex-Partner:   . Emotionally Abused:   Marland Kitchen Physically Abused:   .  Sexually Abused:     Past Surgical History:  Procedure Laterality Date  . UTERINE FIBROID EMBOLIZATION      Family History  Problem Relation Age of Onset  . Hyperlipidemia Mother   . Hypertension Mother   . Colon cancer Father 64  . Hyperlipidemia Father   . Hypertension Father   . Prostate cancer Father 43  . Heart attack Neg Hx   . Stroke Neg Hx     No Known Allergies  Current Outpatient Medications on File Prior to Visit  Medication Sig Dispense Refill  . albuterol (PROVENTIL HFA;VENTOLIN HFA) 108 (90 BASE) MCG/ACT inhaler Inhale 2 puffs into the lungs every 4 (four) hours as needed for shortness of breath.    . fluticasone (FLONASE) 50 MCG/ACT nasal spray Place 2 sprays into both nostrils daily. 16 g 6  . omeprazole (PRILOSEC) 20 MG capsule Take 1 capsule (20 mg total) by mouth daily. 90 capsule 3  . SUMAtriptan (IMITREX) 100 MG tablet I @ ONSET of migraine;repeat in 2 hours if needed 27 tablet 2  . citalopram (CELEXA) 10 MG tablet Take 1 tablet (10 mg total) by mouth daily. (Patient not taking: Reported on 02/17/2020) 30 tablet 0   No current facility-administered medications on file prior to visit.    BP 122/80   Temp (!) 97 F (36.1 C)   Ht 5\' 1"  (1.549 m) Comment: WITHOUT SHOES  Wt 203 lb (92.1 kg)   BMI 38.36 kg/m       Objective:   Physical Exam Vitals and nursing note reviewed.  Constitutional:      General: She is not in acute distress.    Appearance: Normal appearance. She is well-developed. She is obese. She is not ill-appearing.  HENT:     Head: Normocephalic and atraumatic.     Right Ear: Tympanic membrane, ear canal and external ear normal. There is no impacted cerumen.     Left Ear: Tympanic membrane, ear canal and external ear normal. There is no impacted cerumen.     Nose: Nose normal. No congestion or rhinorrhea.     Mouth/Throat:     Mouth: Mucous membranes are moist.     Pharynx: Oropharynx is clear. No oropharyngeal exudate or posterior  oropharyngeal erythema.  Eyes:     General:        Right eye: No discharge.        Left eye: No discharge.     Extraocular Movements: Extraocular movements intact.     Conjunctiva/sclera: Conjunctivae normal.     Pupils: Pupils are equal, round, and reactive to light.  Neck:     Thyroid: No thyromegaly.     Vascular: No carotid bruit.     Trachea: No tracheal deviation.  Cardiovascular:     Rate and Rhythm: Normal rate and regular rhythm.  Pulses: Normal pulses.     Heart sounds: Normal heart sounds. No murmur heard.  No friction rub. No gallop.   Pulmonary:     Effort: Pulmonary effort is normal. No respiratory distress.     Breath sounds: Normal breath sounds. No stridor. No wheezing, rhonchi or rales.  Chest:     Chest wall: No tenderness.  Abdominal:     General: Abdomen is flat. Bowel sounds are normal. There is no distension.     Palpations: Abdomen is soft. There is no mass.     Tenderness: There is no abdominal tenderness. There is no right CVA tenderness, left CVA tenderness, guarding or rebound.     Hernia: No hernia is present.  Musculoskeletal:        General: No swelling, tenderness, deformity or signs of injury. Normal range of motion.     Cervical back: Normal range of motion and neck supple.     Right lower leg: No edema.     Left lower leg: No edema.  Lymphadenopathy:     Cervical: No cervical adenopathy.  Skin:    General: Skin is warm and dry.     Coloration: Skin is not jaundiced or pale.     Findings: No bruising, erythema, lesion or rash.  Neurological:     General: No focal deficit present.     Mental Status: She is alert and oriented to person, place, and time.     Cranial Nerves: No cranial nerve deficit.     Sensory: No sensory deficit.     Motor: No weakness.     Coordination: Coordination normal.     Gait: Gait normal.     Deep Tendon Reflexes: Reflexes normal.  Psychiatric:        Mood and Affect: Mood normal.        Behavior: Behavior  normal.        Thought Content: Thought content normal.        Judgment: Judgment normal.       Assessment & Plan:   1. Routine general medical examination at a health care facility - Follow up in one year or sooner if needed - CBC with Differential/Platelet - Comprehensive metabolic panel - Lipid panel - TSH  2. Situational depression - Continue with Celexa  - CBC with Differential/Platelet - Comprehensive metabolic panel - Lipid panel - TSH - citalopram (CELEXA) 10 MG tablet; Take 1 tablet (10 mg total) by mouth daily.  Dispense: 90 tablet; Refill: 1  3. Mild intermittent asthma without complication - Inhaler as needed - CBC with Differential/Platelet - Comprehensive metabolic panel - Lipid panel - TSH  4. Migraine without aura and without status migrainosus, not intractable - SUMAtriptan (IMITREX) 100 MG tablet; ONSET of migraine;repeat in 2 hours if needed  Dispense: 27 tablet; Refill: 2 - CBC with Differential/Platelet - Comprehensive metabolic panel - Lipid panel - TSH  5. Seasonal allergies - Continue OTC medications  - CBC with Differential/Platelet - Comprehensive metabolic panel - Lipid panel - TSH Dorothyann Peng, NP

## 2020-02-17 NOTE — Addendum Note (Signed)
Addended by: Miles Costain T on: 02/17/2020 09:35 AM   Modules accepted: Orders

## 2020-02-17 NOTE — Patient Instructions (Addendum)
It was great seeing you today   We will follow up with you regarding your blood work   Please work on diet and exercise   Please head over to the [facility] office for labs - lab is in the basement

## 2020-02-18 ENCOUNTER — Other Ambulatory Visit: Payer: Self-pay | Admitting: Adult Health

## 2020-02-18 ENCOUNTER — Encounter: Payer: Self-pay | Admitting: Adult Health

## 2020-02-18 DIAGNOSIS — J302 Other seasonal allergic rhinitis: Secondary | ICD-10-CM

## 2020-02-18 MED ORDER — SUMATRIPTAN SUCCINATE 100 MG PO TABS: ORAL_TABLET | ORAL | 2 refills | Status: DC

## 2020-02-19 ENCOUNTER — Other Ambulatory Visit (INDEPENDENT_AMBULATORY_CARE_PROVIDER_SITE_OTHER): Payer: BC Managed Care – PPO

## 2020-02-19 DIAGNOSIS — Z0000 Encounter for general adult medical examination without abnormal findings: Secondary | ICD-10-CM

## 2020-02-19 DIAGNOSIS — J4520 Mild intermittent asthma, uncomplicated: Secondary | ICD-10-CM

## 2020-02-19 DIAGNOSIS — G43009 Migraine without aura, not intractable, without status migrainosus: Secondary | ICD-10-CM

## 2020-02-19 LAB — CBC WITH DIFFERENTIAL/PLATELET
Basophils Absolute: 0.1 10*3/uL (ref 0.0–0.1)
Basophils Relative: 0.9 % (ref 0.0–3.0)
Eosinophils Absolute: 0.4 10*3/uL (ref 0.0–0.7)
Eosinophils Relative: 3.9 % (ref 0.0–5.0)
HCT: 42.8 % (ref 36.0–46.0)
Hemoglobin: 14.3 g/dL (ref 12.0–15.0)
Lymphocytes Relative: 37.1 % (ref 12.0–46.0)
Lymphs Abs: 3.5 10*3/uL (ref 0.7–4.0)
MCHC: 33.4 g/dL (ref 30.0–36.0)
MCV: 90.6 fl (ref 78.0–100.0)
Monocytes Absolute: 0.7 10*3/uL (ref 0.1–1.0)
Monocytes Relative: 7.6 % (ref 3.0–12.0)
Neutro Abs: 4.7 10*3/uL (ref 1.4–7.7)
Neutrophils Relative %: 50.5 % (ref 43.0–77.0)
Platelets: 307 10*3/uL (ref 150.0–400.0)
RBC: 4.73 Mil/uL (ref 3.87–5.11)
RDW: 13.6 % (ref 11.5–15.5)
WBC: 9.3 10*3/uL (ref 4.0–10.5)

## 2020-02-19 LAB — COMPREHENSIVE METABOLIC PANEL
ALT: 30 U/L (ref 0–35)
AST: 21 U/L (ref 0–37)
Albumin: 4.3 g/dL (ref 3.5–5.2)
Alkaline Phosphatase: 77 U/L (ref 39–117)
BUN: 16 mg/dL (ref 6–23)
CO2: 28 mEq/L (ref 19–32)
Calcium: 9.3 mg/dL (ref 8.4–10.5)
Chloride: 103 mEq/L (ref 96–112)
Creatinine, Ser: 0.85 mg/dL (ref 0.40–1.20)
GFR: 70.4 mL/min (ref >60.00)
Glucose, Bld: 85 mg/dL (ref 70–99)
Potassium: 3.7 mEq/L (ref 3.5–5.1)
Sodium: 140 mEq/L (ref 135–145)
Total Bilirubin: 0.4 mg/dL (ref 0.2–1.2)
Total Protein: 7.3 g/dL (ref 6.0–8.3)

## 2020-02-19 LAB — LIPID PANEL
Cholesterol: 216 mg/dL — ABNORMAL HIGH (ref 0–200)
HDL: 52.5 mg/dL (ref >39.00)
LDL Cholesterol: 147 mg/dL — ABNORMAL HIGH (ref 0–99)
NonHDL: 163.94
Total CHOL/HDL Ratio: 4
Triglycerides: 83 mg/dL (ref 0.0–149.0)
VLDL: 16.6 mg/dL (ref 0.0–40.0)

## 2020-02-19 LAB — TSH: TSH: 2.77 u[IU]/mL (ref 0.35–4.50)

## 2020-02-20 ENCOUNTER — Other Ambulatory Visit: Payer: Self-pay | Admitting: Family Medicine

## 2020-02-20 MED ORDER — ROSUVASTATIN CALCIUM 5 MG PO TABS
5.0000 mg | ORAL_TABLET | Freq: Every day | ORAL | 3 refills | Status: DC
Start: 2020-02-20 — End: 2021-02-11

## 2020-04-16 ENCOUNTER — Encounter: Payer: Self-pay | Admitting: Adult Health

## 2020-04-19 ENCOUNTER — Ambulatory Visit (INDEPENDENT_AMBULATORY_CARE_PROVIDER_SITE_OTHER): Payer: BC Managed Care – PPO | Admitting: Family Medicine

## 2020-04-19 ENCOUNTER — Encounter: Payer: Self-pay | Admitting: Family Medicine

## 2020-04-19 ENCOUNTER — Other Ambulatory Visit: Payer: Self-pay

## 2020-04-19 VITALS — BP 108/80 | HR 94 | Temp 99.0°F | Ht 61.0 in | Wt 203.0 lb

## 2020-04-19 DIAGNOSIS — R05 Cough: Secondary | ICD-10-CM | POA: Diagnosis not present

## 2020-04-19 DIAGNOSIS — R059 Cough, unspecified: Secondary | ICD-10-CM

## 2020-04-19 NOTE — Progress Notes (Signed)
Established Patient Office Visit  Subjective:  Patient ID: Katherine Rosario, female    DOB: 1971/04/30  Age: 49 y.o. MRN: [id number]  CC:  Chief Complaint  Patient presents with  . Sore Throat    sx 3 day     HPI Katherine Rosario presents for onset of sore throat last Friday.  She had some increased fatigue issues and has developed some nasal congestion and cough.  She states she had temperature up to 100 over the weekend.  She states a Mudlogger of hers tested positive last week for Covid.  [name] has had some myalgias.  No significant dyspnea.  She has had previous Covid vaccine.  No loss of taste or smell.  No nausea, vomiting, or diarrhea.  Past Medical History:  Diagnosis Date  . Allergic rhinitis   . Anemia    Heavy menstrual flow/OTC FE BID:    . Asthma   . Fibroids   . GERD (gastroesophageal reflux disease)   . Heart palpitations   . Hyperlipidemia   . Migraines   . OSA (obstructive sleep apnea) 05/06/2014    Past Surgical History:  Procedure Laterality Date  . UTERINE FIBROID EMBOLIZATION      Family History  Problem Relation Age of Onset  . Hyperlipidemia Mother   . Hypertension Mother   . Colon cancer Father 44  . Hyperlipidemia Father   . Hypertension Father   . Prostate cancer Father 16  . Heart attack Neg Hx   . Stroke Neg Hx     Social History   Socioeconomic History  . Marital status: Married    Spouse name: Not on file  . Number of children: Not on file  . Years of education: Not on file  . Highest education level: Not on file  Occupational History  . Occupation: Medical illustrator: [employer]  Tobacco Use  . Smoking status: Never Smoker  . Smokeless tobacco: Current User    Types: Snuff  Vaping Use  . Vaping Use: Never used  Substance and Sexual Activity  . Alcohol use: Yes    Alcohol/week: 0.0 standard drinks    Comment: occ  . Drug use: No  . Sexual activity: Never  Other Topics Concern  . Not on file    Social History Narrative   married to female partner [name] 12/2012, 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      Pt lives with wife, in a one story home. Has some college education.       She likes to hike and swim.    Social Determinants of Health   Financial Resource Strain:   . Difficulty of Paying Living Expenses:   Food Insecurity:   . Worried About Charity fundraiser in the Last Year:   . Arboriculturist in the Last Year:   Transportation Needs:   . Film/video editor (Medical):   Marland Kitchen Lack of Transportation (Non-Medical):   Physical Activity:   . Days of Exercise per Week:   . Minutes of Exercise per Session:   Stress:   . Feeling of Stress :   Social Connections:   . Frequency of Communication with Friends and Family:   . Frequency of Social Gatherings with Friends and Family:   . Attends Religious Services:   . Active Member of Clubs or Organizations:   . Attends Archivist Meetings:   Marland Kitchen Marital Status:   Intimate Partner Violence:   . Fear  of Current or Ex-Partner:   . Emotionally Abused:   Marland Kitchen Physically Abused:   . Sexually Abused:     Outpatient Medications Prior to Visit  Medication Sig Dispense Refill  . albuterol (PROVENTIL HFA;VENTOLIN HFA) 108 (90 BASE) MCG/ACT inhaler Inhale 2 puffs into the lungs every 4 (four) hours as needed for shortness of breath.    . citalopram (CELEXA) 10 MG tablet Take 1 tablet (10 mg total) by mouth daily. 90 tablet 1  . fluticasone (FLONASE) 50 MCG/ACT nasal spray Place 2 sprays into both nostrils daily. 16 g 6  . omeprazole (PRILOSEC) 20 MG capsule Take 1 capsule (20 mg total) by mouth daily. 90 capsule 3  . rosuvastatin (CRESTOR) 5 MG tablet Take 1 tablet (5 mg total) by mouth at bedtime. 90 tablet 3  . SUMAtriptan (IMITREX) 100 MG tablet ONSET of migraine;repeat in 2 hours if needed 27 tablet 2   No facility-administered medications prior to visit.    No Known Allergies  ROS Review of Systems  Constitutional:  Positive for fatigue. Negative for chills and fever.  HENT: Positive for congestion and sore throat.   Respiratory: Positive for cough. Negative for shortness of breath.       Objective:    Physical Exam Vitals reviewed.  Constitutional:      Appearance: She is well-developed.  HENT:     Right Ear: Tympanic membrane normal.     Left Ear: Tympanic membrane normal.     Mouth/Throat:     Mouth: Mucous membranes are moist.     Pharynx: Oropharynx is clear. No oropharyngeal exudate or posterior oropharyngeal erythema.  Cardiovascular:     Rate and Rhythm: Normal rate and regular rhythm.  Pulmonary:     Effort: Pulmonary effort is normal.     Breath sounds: Normal breath sounds. No wheezing or rales.  Musculoskeletal:     Cervical back: Neck supple.  Lymphadenopathy:     Cervical: No cervical adenopathy.  Neurological:     Mental Status: She is alert.     BP 108/80 (BP Location: Left Arm, Patient Position: Sitting, Cuff Size: Large)   Pulse 94   Temp 99 F (37.2 C) (Other (Comment))   Ht 5\' 1"  (1.549 m)   Wt 203 lb (92.1 kg)   SpO2 93%   BMI 38.36 kg/m  Wt Readings from Last 3 Encounters:  04/19/20 203 lb (92.1 kg)  02/17/20 203 lb (92.1 kg)  10/07/19 200 lb (90.7 kg)     Health Maintenance Due  Topic Date Due  . URINE MICROALBUMIN  Never done  . HIV Screening  Never done  . PAP SMEAR-Modifier  02/03/2016  . HEMOGLOBIN A1C  11/08/2017  . MAMMOGRAM  01/09/2020  . INFLUENZA VACCINE  04/04/2020    There are no preventive care reminders to display for this patient.  Lab Results  Component Value Date   TSH 2.77 02/19/2020   Lab Results  Component Value Date   WBC 9.3 02/19/2020   HGB 14.3 02/19/2020   HCT 42.8 02/19/2020   MCV 90.6 02/19/2020   PLT 307.0 02/19/2020   Lab Results  Component Value Date   NA 140 02/19/2020   K 3.7 02/19/2020   CO2 28 02/19/2020   GLUCOSE 85 02/19/2020   BUN 16 02/19/2020   CREATININE 0.85 02/19/2020   BILITOT 0.4  02/19/2020   ALKPHOS 77 02/19/2020   AST 21 02/19/2020   ALT 30 02/19/2020   PROT 7.3 02/19/2020   ALBUMIN 4.3  02/19/2020   CALCIUM 9.3 02/19/2020   ANIONGAP 9 11/14/2017   GFR 70.40 02/19/2020   Lab Results  Component Value Date   CHOL 216 (H) 02/19/2020   Lab Results  Component Value Date   HDL 52.50 02/19/2020   Lab Results  Component Value Date   LDLCALC 147 (H) 02/19/2020   Lab Results  Component Value Date   TRIG 83.0 02/19/2020   Lab Results  Component Value Date   CHOLHDL 4 02/19/2020   Lab Results  Component Value Date   HGBA1C 5.5 05/11/2017      Assessment & Plan:   Patient presents with 4-day history of upper respiratory symptoms with nasal congestion, cough, sore throat, myalgias.  She has had Covid vaccine but had possible exposure to a coworker last week who did subsequently test positive for Covid.  Patient is in no respiratory distress.  Denies any dyspnea.  -We recommend she consider Covid PCR testing to further clarify -Recommend minimum 10-day quarantine from onset of symptoms if not Covid tested -Continue plenty of fluids and rest -Follow-up immediately for any dyspnea or other concerns  No orders of the defined types were placed in this encounter.   Follow-up: No follow-ups on file.    Carolann Littler, MD

## 2020-04-19 NOTE — Patient Instructions (Signed)
Quarantine minimum of 10 days from onset of symptoms  Plenty of fluids and rest  Consider PCR Covid testing

## 2020-04-24 DIAGNOSIS — Z20822 Contact with and (suspected) exposure to covid-19: Secondary | ICD-10-CM | POA: Diagnosis not present

## 2020-07-04 ENCOUNTER — Emergency Department (HOSPITAL_COMMUNITY)
Admission: EM | Admit: 2020-07-04 | Discharge: 2020-07-04 | Disposition: A | Discharge status: Home / Self Care | Payer: BC Managed Care – PPO | Attending: Emergency Medicine | Admitting: Emergency Medicine

## 2020-07-04 ENCOUNTER — Emergency Department (HOSPITAL_COMMUNITY): Payer: BC Managed Care – PPO

## 2020-07-04 DIAGNOSIS — Z7951 Long term (current) use of inhaled steroids: Secondary | ICD-10-CM | POA: Diagnosis not present

## 2020-07-04 DIAGNOSIS — S62637B Displaced fracture of distal phalanx of left little finger, initial encounter for open fracture: Secondary | ICD-10-CM | POA: Diagnosis not present

## 2020-07-04 DIAGNOSIS — I10 Essential (primary) hypertension: Secondary | ICD-10-CM | POA: Diagnosis not present

## 2020-07-04 DIAGNOSIS — S61451A Open bite of right hand, initial encounter: Secondary | ICD-10-CM | POA: Diagnosis not present

## 2020-07-04 DIAGNOSIS — S6991XA Unspecified injury of right wrist, hand and finger(s), initial encounter: Secondary | ICD-10-CM | POA: Diagnosis not present

## 2020-07-04 DIAGNOSIS — S51841A Puncture wound with foreign body of right forearm, initial encounter: Secondary | ICD-10-CM | POA: Diagnosis not present

## 2020-07-04 DIAGNOSIS — E119 Type 2 diabetes mellitus without complications: Secondary | ICD-10-CM | POA: Insufficient documentation

## 2020-07-04 DIAGNOSIS — S61431A Puncture wound without foreign body of right hand, initial encounter: Secondary | ICD-10-CM | POA: Insufficient documentation

## 2020-07-04 DIAGNOSIS — J45909 Unspecified asthma, uncomplicated: Secondary | ICD-10-CM | POA: Insufficient documentation

## 2020-07-04 DIAGNOSIS — S61245A Puncture wound with foreign body of left ring finger without damage to nail, initial encounter: Secondary | ICD-10-CM | POA: Diagnosis not present

## 2020-07-04 DIAGNOSIS — R58 Hemorrhage, not elsewhere classified: Secondary | ICD-10-CM | POA: Diagnosis not present

## 2020-07-04 DIAGNOSIS — S62637A Displaced fracture of distal phalanx of left little finger, initial encounter for closed fracture: Secondary | ICD-10-CM | POA: Diagnosis not present

## 2020-07-04 DIAGNOSIS — F10129 Alcohol abuse with intoxication, unspecified: Secondary | ICD-10-CM | POA: Diagnosis not present

## 2020-07-04 DIAGNOSIS — T07XXXA Unspecified multiple injuries, initial encounter: Secondary | ICD-10-CM | POA: Diagnosis not present

## 2020-07-04 DIAGNOSIS — S62639B Displaced fracture of distal phalanx of unspecified finger, initial encounter for open fracture: Secondary | ICD-10-CM

## 2020-07-04 DIAGNOSIS — S61257A Open bite of left little finger without damage to nail, initial encounter: Secondary | ICD-10-CM | POA: Diagnosis not present

## 2020-07-04 DIAGNOSIS — W540XXA Bitten by dog, initial encounter: Secondary | ICD-10-CM | POA: Diagnosis not present

## 2020-07-04 DIAGNOSIS — S61432A Puncture wound without foreign body of left hand, initial encounter: Secondary | ICD-10-CM | POA: Insufficient documentation

## 2020-07-04 DIAGNOSIS — S51851A Open bite of right forearm, initial encounter: Secondary | ICD-10-CM | POA: Diagnosis not present

## 2020-07-04 DIAGNOSIS — R52 Pain, unspecified: Secondary | ICD-10-CM | POA: Diagnosis not present

## 2020-07-04 LAB — CBC WITH DIFFERENTIAL/PLATELET
Abs Immature Granulocytes: 0.07 10*3/uL (ref 0.00–0.07)
Basophils Absolute: 0.1 10*3/uL (ref 0.0–0.1)
Basophils Relative: 1 %
Eosinophils Absolute: 0.1 10*3/uL (ref 0.0–0.5)
Eosinophils Relative: 0 %
HCT: 43.3 % (ref 36.0–46.0)
Hemoglobin: 14.1 g/dL (ref 12.0–15.0)
Immature Granulocytes: 0 %
Lymphocytes Relative: 10 %
Lymphs Abs: 1.8 10*3/uL (ref 0.7–4.0)
MCH: 30.1 pg (ref 26.0–34.0)
MCHC: 32.6 g/dL (ref 30.0–36.0)
MCV: 92.5 fL (ref 80.0–100.0)
Monocytes Absolute: 0.8 10*3/uL (ref 0.1–1.0)
Monocytes Relative: 4 %
Neutro Abs: 15.2 10*3/uL — ABNORMAL HIGH (ref 1.7–7.7)
Neutrophils Relative %: 85 %
Platelets: 352 10*3/uL (ref 150–400)
RBC: 4.68 MIL/uL (ref 3.87–5.11)
RDW: 13 % (ref 11.5–15.5)
WBC: 18 10*3/uL — ABNORMAL HIGH (ref 4.0–10.5)
nRBC: 0 % (ref 0.0–0.2)

## 2020-07-04 LAB — BASIC METABOLIC PANEL
Anion gap: 11 (ref 5–15)
BUN: 12 mg/dL (ref 6–20)
CO2: 21 mmol/L — ABNORMAL LOW (ref 22–32)
Calcium: 8.6 mg/dL — ABNORMAL LOW (ref 8.9–10.3)
Chloride: 110 mmol/L (ref 98–111)
Creatinine, Ser: 0.96 mg/dL (ref 0.44–1.00)
GFR, Estimated: >60 mL/min (ref >60)
Glucose, Bld: 118 mg/dL — ABNORMAL HIGH (ref 70–99)
Potassium: 4.2 mmol/L (ref 3.5–5.1)
Sodium: 142 mmol/L (ref 135–145)

## 2020-07-04 LAB — ETHANOL: Alcohol, Ethyl (B): 220 mg/dL — ABNORMAL HIGH (ref <10)

## 2020-07-04 MED ORDER — BACITRACIN ZINC 500 UNIT/GM EX OINT: TOPICAL_OINTMENT | Freq: Once | CUTANEOUS | Status: DC

## 2020-07-04 MED ORDER — ACETAMINOPHEN 500 MG PO TABS
1000.0000 mg | ORAL_TABLET | Freq: Once | ORAL | Status: AC
  Administered 2020-07-04: 1000 mg via ORAL
  Filled 2020-07-04: qty 2

## 2020-07-04 MED ORDER — AMOXICILLIN-POT CLAVULANATE 875-125 MG PO TABS: 1.0000 | ORAL_TABLET | Freq: Two times a day (BID) | ORAL | 0 refills | Status: DC

## 2020-07-04 MED ORDER — CEFAZOLIN SODIUM-DEXTROSE 2-4 GM/100ML-% IV SOLN
2.0000 g | INTRAVENOUS | Status: AC
  Administered 2020-07-04: 2 g via INTRAVENOUS
  Filled 2020-07-04: qty 100

## 2020-07-04 NOTE — ED Triage Notes (Signed)
Pt arrives by EMS from home. Pt was bite by her dog. Both wrists are injured. Right wrist has  2-3 inch lacerations, bleeding controlled.  Pt reported to have ETOH on board. Pt alert and oriented X4. 10/10 pain

## 2020-07-04 NOTE — Discharge Instructions (Addendum)
Please pick up antibiotics and take as prescribed to cover for infection. Please keep wounds clean and dry. You can apply bacitracin ointment to the areas to help with wound healing.  Wear splint on your finger and follow up with Dr. Doreatha Martin given your fracture. Take Ibuprofen and Tylenol as needed for pain. While at home please rest, ice, and elevate your hands to reduce swelling/inflammation Return to the ED IMMEDIATELY for any signs of infection including redness/swelling around any of the wounds, red streaking up your arm, drainage of pus, fevers > 100.4, chills, or any other new/worsening symptoms.

## 2020-07-04 NOTE — ED Provider Notes (Signed)
[facility] Provider Note   CSN: [id number] Arrival date & time: 07/04/20  1909     History Chief Complaint  Patient presents with  . Animal Bite    Katherine Rosario is a 49 y.o. female who presents to the ED via EMS for dog bite to bilateral hands that occurred just PTA. Pt appears to be under the influence of alcohol - reports she has had 3 beers and a couple of "shots" earlier today. She is unsure what happened with her dogs but believes she was breaking up a fight given one of the dogs is injured as well and going to the vet. She is unsure which of her 3 dogs bit her - they are all up to date on their rabies vaccine. Pt is also UTD on tetanus at this time. No other complaints.   The history is provided by the patient and medical records.       Past Medical History:  Diagnosis Date  . Allergic rhinitis   . Anemia    Heavy menstrual flow/OTC FE BID:    . Asthma   . Fibroids   . GERD (gastroesophageal reflux disease)   . Heart palpitations   . Hyperlipidemia   . Migraines   . OSA (obstructive sleep apnea) 05/06/2014    Patient Active Problem List   Diagnosis Date Noted  . Dysfunction of left eustachian tube 03/12/2017  . Left ear impacted cerumen 03/12/2017  . Bilateral edema of lower extremity 09/01/2015  . Migraine with aura and without status migrainosus, not intractable 07/12/2015  . Migraine 04/27/2015  . Palpitations 06/23/2014  . Abnormal ECG 06/23/2014  . Syncope 06/23/2014  . Orthostatic hypotension 06/23/2014  . OSA (obstructive sleep apnea) 05/06/2014  . Other malaise and fatigue 05/02/2014  . Nonspecific elevation of levels of transaminase or lactic acid dehydrogenase (LDH) 05/02/2014  . Acute upper respiratory infections of unspecified site 11/19/2013  . Obese 08/25/2013  . Allergic rhinitis 08/25/2013  . Anemia 08/23/2012  . Heart palpitations   . Asthma   . GERD (gastroesophageal reflux disease)   .  Hyperlipidemia   . Diabetes mellitus, type II ([place]) 05/16/2004  . Intractable migraine without aura 01/18/1999  . Headache 12/24/1998    Past Surgical History:  Procedure Laterality Date  . UTERINE FIBROID EMBOLIZATION       OB History   No obstetric history on file.     Family History  Problem Relation Age of Onset  . Hyperlipidemia Mother   . Hypertension Mother   . Colon cancer Father 63  . Hyperlipidemia Father   . Hypertension Father   . Prostate cancer Father 9  . Heart attack Neg Hx   . Stroke Neg Hx     Social History   Tobacco Use  . Smoking status: Never Smoker  . Smokeless tobacco: Current User    Types: Snuff  Vaping Use  . Vaping Use: Never used  Substance Use Topics  . Alcohol use: Yes    Alcohol/week: 0.0 standard drinks    Comment: occ  . Drug use: No    Home Medications Prior to Admission medications   Medication Sig Start Date End Date Taking? Authorizing Provider  albuterol (PROVENTIL HFA;VENTOLIN HFA) 108 (90 BASE) MCG/ACT inhaler Inhale 2 puffs into the lungs every 4 (four) hours as needed for shortness of breath.    [provider]  amoxicillin-clavulanate (AUGMENTIN) 875-125 MG tablet Take 1 tablet by mouth every 12 (twelve) hours.  07/04/20   Alroy Bailiff, Vidya Bamford, PA-C  citalopram (CELEXA) 10 MG tablet Take 1 tablet (10 mg total) by mouth daily. 02/17/20   Nafziger, Tommi Rumps, NP  fluticasone (FLONASE) 50 MCG/ACT nasal spray Place 2 sprays into both nostrils daily. 05/31/18   Nafziger, Tommi Rumps, NP  omeprazole (PRILOSEC) 20 MG capsule Take 1 capsule (20 mg total) by mouth daily. 02/17/20   Nafziger, Tommi Rumps, NP  rosuvastatin (CRESTOR) 5 MG tablet Take 1 tablet (5 mg total) by mouth at bedtime. 02/20/20   Nafziger, Tommi Rumps, NP  SUMAtriptan (IMITREX) 100 MG tablet ONSET of migraine;repeat in 2 hours if needed 02/18/20   Dorothyann Peng, NP    Allergies    Patient has no known allergies.  Review of Systems   Review of Systems  Constitutional: Negative  for chills and fever.  Musculoskeletal: Positive for arthralgias.  Skin: Positive for wound.  All other systems reviewed and are negative.   Physical Exam Updated Vital Signs BP 116/78   Pulse 83   Temp 98.5 F (36.9 C) (Oral)   Resp (!) 26   SpO2 95%   Physical Exam Vitals and nursing note reviewed.  Constitutional:      Appearance: She is not ill-appearing.     Comments: Intoxicated  HENT:     Head: Normocephalic and atraumatic.  Eyes:     Conjunctiva/sclera: Conjunctivae normal.  Cardiovascular:     Rate and Rhythm: Normal rate and regular rhythm.     Pulses: Normal pulses.  Pulmonary:     Effort: Pulmonary effort is normal.     Breath sounds: Normal breath sounds. No wheezing, rhonchi or rales.  Abdominal:     Palpations: Abdomen is soft.     Tenderness: There is no abdominal tenderness.  Musculoskeletal:     Cervical back: Neck supple.     Comments: Multiple puncture wounds to right forearm and right hand; bleeding controlled; 2+ radial pulse  Puncture wound to left ring finger with lateral aspect of nail missing; small laceration to middle of nail; does not go through and through; cap refill < 2 seconds, ROM intact to MCP, PIP, and DIP joint of same finger; 2+ radial pulse. Additional puncture wounds to left hand as well  Skin:    General: Skin is warm and dry.  Neurological:     Mental Status: She is alert.    Right Hand     Left Hand         ED Results / Procedures / Treatments   Labs (all labs ordered are listed, but only abnormal results are displayed) Labs Reviewed  ETHANOL - Abnormal; Notable for the following components:      Result Value   Alcohol, Ethyl (B) 220 (*)    All other components within normal limits  BASIC METABOLIC PANEL - Abnormal; Notable for the following components:   CO2 21 (*)    Glucose, Bld 118 (*)    Calcium 8.6 (*)    All other components within normal limits  CBC WITH DIFFERENTIAL/PLATELET - Abnormal; Notable  for the following components:   WBC 18.0 (*)    Neutro Abs 15.2 (*)    All other components within normal limits    EKG None  Radiology DG Forearm Right  Result Date: 07/04/2020 CLINICAL DATA:  Initial evaluation for acute trauma, dog bite. EXAM: RIGHT FOREARM - 2 VIEW COMPARISON:  None. FINDINGS: Soft tissue irregularity at the volar aspect of the distal right forearm, presumably related to history of dog bite. No  radiopaque foreign body. No acute fracture dislocation. Osseous mineralization normal. Mild osteoarthritic changes noted about the wrist and elbow. IMPRESSION: 1. Soft tissue irregularity at the volar aspect of the distal right forearm, presumably related to history of dog bite. No radiopaque foreign body. 2. No acute fracture or dislocation. Electronically Signed   By: Jeannine Boga M.D.   On: 07/04/2020 20:24   DG Hand Complete Left  Result Date: 07/04/2020 CLINICAL DATA:  Dog bite EXAM: LEFT HAND - COMPLETE 3+ VIEW COMPARISON:  None. FINDINGS: Three view radiograph left hand demonstrates a minimally displaced fracture of the distal tuft of the distal phalanx of the little finger. Fracture fragments are in anatomic alignment. No other fracture or dislocation. Joint spaces are preserved. Soft tissues are notable for mild soft tissue swelling involving the thenar eminence. No retained radiopaque foreign body. IMPRESSION: Minimally displaced fracture of the distal tuft of the little finger. Soft tissue swelling.  No retained radiopaque foreign body. Electronically Signed   By: Fidela Salisbury MD   On: 07/04/2020 20:28   DG Hand Complete Right  Result Date: 07/04/2020 CLINICAL DATA:  Dog bite EXAM: RIGHT HAND - COMPLETE 3+ VIEW COMPARISON:  None. FINDINGS: Three view radiograph right hand demonstrates normal alignment. No fracture or dislocation. Joint spaces are preserved. There is subcutaneous gas seen dorsal to the carpus and along the volar aspect of the visualized right  forearm at the level of the distal radius and ulna in keeping with given history of trauma. No retained radiopaque foreign body. IMPRESSION: Subcutaneous gas in keeping with history of trauma.  No fracture. Electronically Signed   By: Fidela Salisbury MD   On: 07/04/2020 20:24    Procedures Procedures (including critical care time)  Medications Ordered in ED Medications  bacitracin ointment (has no administration in time range)  acetaminophen (TYLENOL) tablet 1,000 mg (1,000 mg Oral Given 07/04/20 2112)  ceFAZolin (ANCEF) IVPB 2g/100 mL premix (0 g Intravenous Stopped 07/04/20 2255)    ED Course  I have reviewed the triage vital signs and the nursing notes.  Pertinent labs & imaging results that were available during my care of the patient were reviewed by me and considered in my medical decision making (see chart for details).    MDM Rules/Calculators/A&P                          49 year old female presents to the ED today with multiple wounds to her bilateral forearms and hands sustained from her dog after breaking up a fight.  Patient is intoxicated, admits to drinking tonight.  Her dogs are up-to-date on rabies and she is up-to-date on tetanus.  On exam patient has multiple puncture wounds to her right forearm, hand, left hand.  Will obtain x-rays to assess for any bony deformities, foreign bodies.  Tylenol given for pain given patient is intoxicated.  X-ray of the left hand does show distal tuft fracture of the left pinky finger, patient does have a mature wound to this area with partial missing nail classifying this as an open fracture.  Will touch base with hand surgery.  Discussed case with Dr. Doreatha Martin who agrees with plan of Ancef in the ED, copious irrigation of the wounds, splint, follow-up in the office.  Given IV needs to be started for Ancef will obtain labs and EtOH level at this time.  Wounds have been cleaned by nursing tech.  I went in afterwards and copiously irrigated  wounds  with normal saline via pressure watch.  Given these are dog bites and small puncture wounds I do not feel that any of them require sutures at this time.  Will apply bacitracin and cover wounds and plan to discharge home with Augmentin.   CBC with a leukocytosis of 18,000, I suspect this is related to pain. BMP with glucose 118, bicarb 21.  No other abnormalities. EtOH 220.  Pt able to ambulate without dificulty for safety. She will take an uber home. Stable for discharge.   This note was prepared using Dragon voice recognition software and may include unintentional dictation errors due to the inherent limitations of voice recognition software.    Final Clinical Impression(s) / ED Diagnoses Final diagnoses:  Dog bite, initial encounter  Open fracture of tuft of distal phalanx of finger    Rx / DC Orders ED Discharge Orders         Ordered    amoxicillin-clavulanate (AUGMENTIN) 875-125 MG tablet  Every 12 hours        07/04/20 2201           Discharge Instructions     Please pick up antibiotics and take as prescribed to cover for infection. Please keep wounds clean and dry. You can apply bacitracin ointment to the areas to help with wound healing.  Wear splint on your finger and follow up with Dr. Doreatha Martin given your fracture. Take Ibuprofen and Tylenol as needed for pain. While at home please rest, ice, and elevate your hands to reduce swelling/inflammation Return to the ED IMMEDIATELY for any signs of infection including redness/swelling around any of the wounds, red streaking up your arm, drainage of pus, fevers > 100.4, chills, or any other new/worsening symptoms.        Eustaquio Maize, PA-C 07/04/20 2256    Pattricia Boss, MD 07/05/20 [phone]

## 2020-07-05 NOTE — Progress Notes (Signed)
Orthopedic Tech Progress Note Patient Details:  KNOX HOLDMAN 1971-06-26 [id number]  Ortho Devices Type of Ortho Device: Finger splint Ortho Device/Splint Location: lue pinky Ortho Device/Splint Interventions: Ordered, Application, Adjustment   Post Interventions Patient Tolerated: Well Instructions Provided: Care of device, Adjustment of device   Karolee Stamps 07/05/2020, 12:15 AM

## 2020-07-16 ENCOUNTER — Encounter: Payer: Self-pay | Admitting: Adult Health

## 2020-08-02 ENCOUNTER — Encounter: Payer: Self-pay | Admitting: Adult Health

## 2020-08-13 ENCOUNTER — Other Ambulatory Visit: Payer: Self-pay | Admitting: Adult Health

## 2020-08-13 DIAGNOSIS — F4321 Adjustment disorder with depressed mood: Secondary | ICD-10-CM

## 2020-09-21 ENCOUNTER — Encounter: Payer: Self-pay | Admitting: Adult Health

## 2020-09-21 ENCOUNTER — Telehealth (INDEPENDENT_AMBULATORY_CARE_PROVIDER_SITE_OTHER): Payer: BC Managed Care – PPO | Admitting: Adult Health

## 2020-09-21 VITALS — Wt 203.0 lb

## 2020-09-21 DIAGNOSIS — J0141 Acute recurrent pansinusitis: Secondary | ICD-10-CM | POA: Diagnosis not present

## 2020-09-21 MED ORDER — DOXYCYCLINE HYCLATE 100 MG PO CAPS: 100.0000 mg | ORAL_CAPSULE | Freq: Two times a day (BID) | ORAL | 0 refills | Status: DC

## 2020-09-21 NOTE — Telephone Encounter (Signed)
I have scheduled the pt for a virtual visit.  Nothing further needed.

## 2020-09-21 NOTE — Progress Notes (Signed)
Virtual Visit via Video Note  I connected with Katherine Rosario on 09/21/20 at  2:30 PM EST by a video enabled telemedicine application and verified that I am speaking with the correct person using two identifiers.  Location patient: home Location provider:work or home office Persons participating in the virtual visit: patient, provider  I discussed the limitations of evaluation and management by telemedicine and the availability of in person appointments. The patient expressed understanding and agreed to proceed.   HPI: 50 year old female who is being evaluated today for an acute issue.  Her symptoms started roughly 7 to 10 days ago.  Symptoms include sinus pain and pressure, sinus congestion with discolored mucus, low-grade fever up to 100, postnasal drip.  She denies chills, shortness of breath, chest pain, or wheezing.  She did use over-the-counter Afrin without resolution.  Does get sinus infections about once a year feels as though this is similar.  He has not been COVID tested but has received all 3 COVID vaccinations   ROS: See pertinent positives and negatives per HPI.  Past Medical History:  Diagnosis Date  . Allergic rhinitis   . Anemia    Heavy menstrual flow/OTC FE BID:    . Asthma   . Fibroids   . GERD (gastroesophageal reflux disease)   . Heart palpitations   . Hyperlipidemia   . Migraines   . OSA (obstructive sleep apnea) 05/06/2014    Past Surgical History:  Procedure Laterality Date  . UTERINE FIBROID EMBOLIZATION      Family History  Problem Relation Age of Onset  . Hyperlipidemia Mother   . Hypertension Mother   . Colon cancer Father 40  . Hyperlipidemia Father   . Hypertension Father   . Prostate cancer Father 80  . Heart attack Neg Hx   . Stroke Neg Hx        Current Outpatient Medications:  .  albuterol (PROVENTIL HFA;VENTOLIN HFA) 108 (90 BASE) MCG/ACT inhaler, Inhale 2 puffs into the lungs every 4 (four) hours as needed for shortness of breath.,  Disp: , Rfl:  .  citalopram (CELEXA) 10 MG tablet, TAKE 1 TABLET BY MOUTH EVERY DAY, Disp: 90 tablet, Rfl: 1 .  fluticasone (FLONASE) 50 MCG/ACT nasal spray, Place 2 sprays into both nostrils daily., Disp: 16 g, Rfl: 6 .  omeprazole (PRILOSEC) 20 MG capsule, Take 1 capsule (20 mg total) by mouth daily., Disp: 90 capsule, Rfl: 3 .  rosuvastatin (CRESTOR) 5 MG tablet, Take 1 tablet (5 mg total) by mouth at bedtime., Disp: 90 tablet, Rfl: 3 .  SUMAtriptan (IMITREX) 100 MG tablet, ONSET of migraine;repeat in 2 hours if needed, Disp: 27 tablet, Rfl: 2  EXAM:  VITALS per patient if applicable:  GENERAL: alert, oriented, appears well and in no acute distress  HEENT: atraumatic, conjunttiva clear, no obvious abnormalities on inspection of external nose and ears  NECK: normal movements of the head and neck  LUNGS: on inspection no signs of respiratory distress, breathing rate appears normal, no obvious gross SOB, gasping or wheezing  CV: no obvious cyanosis  MS: moves all visible extremities without noticeable abnormality  PSYCH/NEURO: pleasant and cooperative, no obvious depression or anxiety, speech and thought processing grossly intact  ASSESSMENT AND PLAN:  Discussed the following assessment and plan:  1. Acute recurrent pansinusitis -We will treat for sinusitis.  She was advised to get tested for COVID-19.  Information on how to get an appointment was given.  Follow-up if no improvement over the next 2  to 3 days - doxycycline (VIBRAMYCIN) 100 MG capsule; Take 1 capsule (100 mg total) by mouth 2 (two) times daily.  Dispense: 14 capsule; Refill: 0     I discussed the assessment and treatment plan with the patient. The patient was provided an opportunity to ask questions and all were answered. The patient agreed with the plan and demonstrated an understanding of the instructions.   The patient was advised to call back or seek an in-person evaluation if the symptoms worsen or if the  condition fails to improve as anticipated.   Dorothyann Peng, NP

## 2020-10-04 ENCOUNTER — Other Ambulatory Visit: Payer: BC Managed Care – PPO

## 2020-10-04 DIAGNOSIS — Z20822 Contact with and (suspected) exposure to covid-19: Secondary | ICD-10-CM

## 2020-10-05 LAB — SARS-COV-2, NAA 2 DAY TAT

## 2020-10-05 LAB — NOVEL CORONAVIRUS, NAA: SARS-CoV-2, NAA: NOT DETECTED

## 2020-10-08 ENCOUNTER — Other Ambulatory Visit: Payer: Self-pay | Admitting: Adult Health

## 2020-10-08 ENCOUNTER — Encounter: Payer: Self-pay | Admitting: Adult Health

## 2020-10-08 MED ORDER — PREDNISONE 10 MG PO TABS: ORAL_TABLET | ORAL | 0 refills | Status: DC

## 2020-10-08 MED ORDER — LEVOFLOXACIN 500 MG PO TABS: 500.0000 mg | ORAL_TABLET | Freq: Every day | ORAL | 0 refills | Status: DC

## 2020-11-10 ENCOUNTER — Ambulatory Visit (INDEPENDENT_AMBULATORY_CARE_PROVIDER_SITE_OTHER): Payer: BC Managed Care – PPO

## 2020-11-10 ENCOUNTER — Ambulatory Visit: Payer: BC Managed Care – PPO | Admitting: Adult Health

## 2020-11-10 ENCOUNTER — Encounter: Payer: Self-pay | Admitting: Adult Health

## 2020-11-10 ENCOUNTER — Other Ambulatory Visit: Payer: Self-pay

## 2020-11-10 VITALS — BP 114/72 | HR 72 | Temp 97.8°F | Resp 18 | Wt 216.2 lb

## 2020-11-10 DIAGNOSIS — M2550 Pain in unspecified joint: Secondary | ICD-10-CM

## 2020-11-10 DIAGNOSIS — M25521 Pain in right elbow: Secondary | ICD-10-CM | POA: Diagnosis not present

## 2020-11-10 DIAGNOSIS — M25562 Pain in left knee: Secondary | ICD-10-CM | POA: Diagnosis not present

## 2020-11-10 DIAGNOSIS — M25561 Pain in right knee: Secondary | ICD-10-CM | POA: Diagnosis not present

## 2020-11-10 DIAGNOSIS — M25522 Pain in left elbow: Secondary | ICD-10-CM | POA: Diagnosis not present

## 2020-11-10 LAB — CBC WITH DIFFERENTIAL/PLATELET
Basophils Absolute: 0.1 10*3/uL (ref 0.0–0.1)
Basophils Relative: 1.2 % (ref 0.0–3.0)
Eosinophils Absolute: 0.3 10*3/uL (ref 0.0–0.7)
Eosinophils Relative: 4.6 % (ref 0.0–5.0)
HCT: 41.1 % (ref 36.0–46.0)
Hemoglobin: 13.8 g/dL (ref 12.0–15.0)
Lymphocytes Relative: 31.4 % (ref 12.0–46.0)
Lymphs Abs: 2 10*3/uL (ref 0.7–4.0)
MCHC: 33.6 g/dL (ref 30.0–36.0)
MCV: 89.7 fl (ref 78.0–100.0)
Monocytes Absolute: 0.5 10*3/uL (ref 0.1–1.0)
Monocytes Relative: 7.8 % (ref 3.0–12.0)
Neutro Abs: 3.5 10*3/uL (ref 1.4–7.7)
Neutrophils Relative %: 55 % (ref 43.0–77.0)
Platelets: 337 10*3/uL (ref 150.0–400.0)
RBC: 4.58 Mil/uL (ref 3.87–5.11)
RDW: 13.7 % (ref 11.5–15.5)
WBC: 6.4 10*3/uL (ref 4.0–10.5)

## 2020-11-10 LAB — HEPATIC FUNCTION PANEL
ALT: 30 (ref 7–35)
AST: 23 (ref 13–35)

## 2020-11-10 LAB — COMPREHENSIVE METABOLIC PANEL
ALT: 30 U/L (ref 0–35)
AST: 23 U/L (ref 0–37)
Albumin: 4 g/dL (ref 3.5–5.2)
Alkaline Phosphatase: 72 U/L (ref 39–117)
BUN: 16 mg/dL (ref 6–23)
CO2: 23 mEq/L (ref 19–32)
Calcium: 9.2 mg/dL (ref 8.4–10.5)
Chloride: 106 mEq/L (ref 96–112)
Creatinine, Ser: 0.81 mg/dL (ref 0.40–1.20)
GFR: 83.63 mL/min (ref >60.00)
Glucose, Bld: 92 mg/dL (ref 70–99)
Potassium: 4.4 mEq/L (ref 3.5–5.1)
Sodium: 140 mEq/L (ref 135–145)
Total Bilirubin: 0.6 mg/dL (ref 0.2–1.2)
Total Protein: 6.8 g/dL (ref 6.0–8.3)

## 2020-11-10 LAB — C-REACTIVE PROTEIN: CRP: <1 mg/dL (ref 0.5–20.0)

## 2020-11-10 LAB — SEDIMENTATION RATE: Sed Rate: 20 mm/hr (ref 0–30)

## 2020-11-10 NOTE — Progress Notes (Signed)
Subjective:    Patient ID: Katherine Rosario, female    DOB: 1971/01/25, 50 y.o.   MRN: [id number]  HPI  50 year old female who  has a past medical history of Allergic rhinitis, Anemia, Asthma, Fibroids, GERD (gastroesophageal reflux disease), Heart palpitations, Hyperlipidemia, Migraines, and OSA (obstructive sleep apnea) (05/06/2014).   She presents to the office today for painful swelling joints throughout her body.  Reports that her symptoms have been present for a few months".  Symptoms include joint tenderness stiffness in all of her joints, swelling in her hands and knees especially, this has improved.  Low-grade fever up to 100.6 and chronic fatigue.  Her mother had lupus and her cousin passed away from complications of lupus so she is concerned that she may have an autoimmune disease.  Discomfort is more apparent in her elbows and knees especially on the left side  Has used Motrin to help with the swelling which seems to work but does not do much for the joint tenderness.  Pain is worse with range of motion such as lifting, carrying, and walking.  She denies redness or warmth to her joints  She did have a negative ANA and rheumatoid factor back in August 2020  Review of Systems   See HP   Past Medical History:  Diagnosis Date  . Allergic rhinitis   . Anemia    Heavy menstrual flow/OTC FE BID:    . Asthma   . Fibroids   . GERD (gastroesophageal reflux disease)   . Heart palpitations   . Hyperlipidemia   . Migraines   . OSA (obstructive sleep apnea) 05/06/2014    Social History   Socioeconomic History  . Marital status: Married    Spouse name: Not on file  . Number of children: Not on file  . Years of education: Not on file  . Highest education level: Not on file  Occupational History  . Occupation: Medical illustrator: [employer]  Tobacco Use  . Smoking status: Never Smoker  . Smokeless tobacco: Current User    Types: Snuff  Vaping  Use  . Vaping Use: Never used  Substance and Sexual Activity  . Alcohol use: Yes    Alcohol/week: 0.0 standard drinks    Comment: occ  . Drug use: No  . Sexual activity: Never  Other Topics Concern  . 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      Pt lives with wife, in a one story home. Has some college education.       She likes to hike and swim.    Social Determinants of Health   Financial Resource Strain: Not on file  Food Insecurity: Not on file  Transportation Needs: Not on file  Physical Activity: Not on file  Stress: Not on file  Social Connections: Not on file  Intimate Partner Violence: Not on file    Past Surgical History:  Procedure Laterality Date  . UTERINE FIBROID EMBOLIZATION      Family History  Problem Relation Age of Onset  . Hyperlipidemia Mother   . Hypertension Mother   . Colon cancer Father 55  . Hyperlipidemia Father   . Hypertension Father   . Prostate cancer Father 34  . Heart attack Neg Hx   . Stroke Neg Hx     No Known Allergies  Current Outpatient Medications on File Prior to Visit  Medication  Sig Dispense Refill  . albuterol (PROVENTIL HFA;VENTOLIN HFA) 108 (90 BASE) MCG/ACT inhaler Inhale 2 puffs into the lungs every 4 (four) hours as needed for shortness of breath.    . citalopram (CELEXA) 10 MG tablet TAKE 1 TABLET BY MOUTH EVERY DAY 90 tablet 1  . fluticasone (FLONASE) 50 MCG/ACT nasal spray Place 2 sprays into both nostrils daily. 16 g 6  . levofloxacin (LEVAQUIN) 500 MG tablet Take 1 tablet (500 mg total) by mouth daily. 7 tablet 0  . omeprazole (PRILOSEC) 20 MG capsule Take 1 capsule (20 mg total) by mouth daily. 90 capsule 3  . predniSONE (DELTASONE) 10 MG tablet 40 mg x 3 days, 20 mg x 3 days, 10 mg x 3 days 21 tablet 0  . rosuvastatin (CRESTOR) 5 MG tablet Take 1 tablet (5 mg total) by mouth at bedtime. 90 tablet 3  . SUMAtriptan (IMITREX) 100 MG  tablet ONSET of migraine;repeat in 2 hours if needed 27 tablet 2   No current facility-administered medications on file prior to visit.    BP 114/72 (BP Location: Left Arm, Patient Position: Sitting, Cuff Size: Normal)   Pulse 72   Temp 97.8 F (36.6 C) (Oral)   Resp 18   Wt 216 lb 3.2 oz (98.1 kg)   SpO2 99%   BMI 40.85 kg/m    Past Medical History:  Diagnosis Date  . Allergic rhinitis   . Anemia    Heavy menstrual flow/OTC FE BID:    . Asthma   . Fibroids   . GERD (gastroesophageal reflux disease)   . Heart palpitations   . Hyperlipidemia   . Migraines   . OSA (obstructive sleep apnea) 05/06/2014    Social History   Socioeconomic History  . Marital status: Married    Spouse name: Not on file  . Number of children: Not on file  . Years of education: Not on file  . Highest education level: Not on file  Occupational History  . Occupation: Medical illustrator: [employer]  Tobacco Use  . Smoking status: Never Smoker  . Smokeless tobacco: Current User    Types: Snuff  Vaping Use  . Vaping Use: Never used  Substance and Sexual Activity  . Alcohol use: Yes    Alcohol/week: 0.0 standard drinks    Comment: occ  . Drug use: No  . Sexual activity: Never  Other Topics Concern  . 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      Pt lives with wife, in a one story home. Has some college education.       She likes to hike and swim.    Social Determinants of Health   Financial Resource Strain: Not on file  Food Insecurity: Not on file  Transportation Needs: Not on file  Physical Activity: Not on file  Stress: Not on file  Social Connections: Not on file  Intimate Partner Violence: Not on file    Past Surgical History:  Procedure Laterality Date  . UTERINE FIBROID EMBOLIZATION      Family History  Problem Relation Age of Onset  . Hyperlipidemia Mother   .  Hypertension Mother   . Colon cancer Father 57  . Hyperlipidemia Father   . Hypertension Father   . Prostate cancer Father 36  . Heart attack Neg Hx   . Stroke Neg Hx     No Known Allergies  Current Outpatient Medications on File Prior to Visit  Medication Sig Dispense Refill  . albuterol (PROVENTIL HFA;VENTOLIN HFA) 108 (90 BASE) MCG/ACT inhaler Inhale 2 puffs into the lungs every 4 (four) hours as needed for shortness of breath.    . citalopram (CELEXA) 10 MG tablet TAKE 1 TABLET BY MOUTH EVERY DAY 90 tablet 1  . fluticasone (FLONASE) 50 MCG/ACT nasal spray Place 2 sprays into both nostrils daily. 16 g 6  . levofloxacin (LEVAQUIN) 500 MG tablet Take 1 tablet (500 mg total) by mouth daily. 7 tablet 0  . omeprazole (PRILOSEC) 20 MG capsule Take 1 capsule (20 mg total) by mouth daily. 90 capsule 3  . predniSONE (DELTASONE) 10 MG tablet 40 mg x 3 days, 20 mg x 3 days, 10 mg x 3 days 21 tablet 0  . rosuvastatin (CRESTOR) 5 MG tablet Take 1 tablet (5 mg total) by mouth at bedtime. 90 tablet 3  . SUMAtriptan (IMITREX) 100 MG tablet ONSET of migraine;repeat in 2 hours if needed 27 tablet 2   No current facility-administered medications on file prior to visit.    BP 114/72 (BP Location: Left Arm, Patient Position: Sitting, Cuff Size: Normal)   Pulse 72   Temp 97.8 F (36.6 C) (Oral)   Resp 18   Wt 216 lb 3.2 oz (98.1 kg)   SpO2 99%   BMI 40.85 kg/m       Objective:   Physical Exam Vitals and nursing note reviewed.  Constitutional:      Appearance: Normal appearance. She is obese.  Cardiovascular:     Rate and Rhythm: Normal rate and regular rhythm.     Pulses: Normal pulses.     Heart sounds: Normal heart sounds.  Pulmonary:     Effort: Pulmonary effort is normal.     Breath sounds: Normal breath sounds.  Abdominal:     General: Abdomen is flat.     Palpations: Abdomen is soft.  Musculoskeletal:     Right shoulder: Tenderness present. No swelling or crepitus. Normal  range of motion.     Left shoulder: Tenderness present. No swelling or crepitus. Normal range of motion.     Right elbow: No swelling. Tenderness present.     Left elbow: No swelling. Tenderness present.     Right wrist: Tenderness present. No swelling or crepitus. Decreased range of motion. Normal pulse.     Left wrist: Tenderness present. No swelling or crepitus. Normal range of motion. Normal pulse.     Right hand: Tenderness present. No swelling. Decreased strength (unable to make complete fist ). Normal capillary refill. Normal pulse.     Left hand: Tenderness present. No swelling. Decreased strength (unable to make complete fist ). Normal capillary refill. Normal pulse.     Right knee: Bony tenderness present. No swelling, deformity or crepitus. Tenderness present over the medial joint line, lateral joint line and patellar tendon. Normal alignment.     Left knee: Bony tenderness present. No swelling, deformity or crepitus. Tenderness present over the medial joint line, lateral joint line and patellar tendon. Normal alignment.     Right ankle: No swelling. Tenderness present. Normal range of motion. Normal pulse.     Left ankle: No swelling. Tenderness present. Normal range of motion. Normal pulse.  Skin:    General: Skin is warm and dry.     Capillary Refill: Capillary refill takes less than 2 seconds.  Neurological:     General: No focal deficit present.  Mental Status: She is alert and oriented to person, place, and time.  Psychiatric:        Mood and Affect: Mood normal.        Behavior: Behavior normal.        Thought Content: Thought content normal.        Judgment: Judgment normal.       Assessment & Plan:  1. Polyarthralgia -Start with lab work and x-rays of the most painful joints including knees and elbows bilaterally.  Consider lupus fibromyalgia versus other autoimmune disease. Likely referral to Rheumatology  - ANA; Future - Rheumatoid Factor; Future - C-reactive  Protein; Future - Sedimentation Rate; Future - Lactate Dehydrogenase; Future - CBC with Differential/Platelet; Future - CMP; Future - DG Knee 1-2 Views Right; Future - DG Knee 1-2 Views Left; Future - DG Elbow Complete Right; Future - DG Elbow Complete Left; Future - CMP - CBC with Differential/Platelet - Lactate Dehydrogenase - Sedimentation Rate - C-reactive Protein - Rheumatoid Factor - ANA  Dorothyann Peng, NP

## 2020-11-11 ENCOUNTER — Telehealth: Payer: Self-pay | Admitting: Adult Health

## 2020-11-11 DIAGNOSIS — M2550 Pain in unspecified joint: Secondary | ICD-10-CM

## 2020-11-11 NOTE — Telephone Encounter (Signed)
Spoke to patient and informed her of her labs as well as x-rays.  All were negative.  Due to symptoms of polyarthralgia will refer to rheumatology for further evaluation

## 2020-11-12 LAB — LACTATE DEHYDROGENASE: LDH: 151 U/L (ref 120–250)

## 2020-11-12 LAB — ANA: Anti Nuclear Antibody (ANA): NEGATIVE

## 2020-11-12 LAB — RHEUMATOID FACTOR: Rheumatoid fact SerPl-aCnc: <14 IU/mL (ref <14)

## 2020-11-17 DIAGNOSIS — M79643 Pain in unspecified hand: Secondary | ICD-10-CM | POA: Diagnosis not present

## 2020-11-17 DIAGNOSIS — M79671 Pain in right foot: Secondary | ICD-10-CM | POA: Diagnosis not present

## 2020-11-17 DIAGNOSIS — M2550 Pain in unspecified joint: Secondary | ICD-10-CM | POA: Diagnosis not present

## 2020-11-17 DIAGNOSIS — M545 Low back pain, unspecified: Secondary | ICD-10-CM | POA: Diagnosis not present

## 2020-11-17 DIAGNOSIS — M7989 Other specified soft tissue disorders: Secondary | ICD-10-CM | POA: Diagnosis not present

## 2020-11-17 DIAGNOSIS — M1990 Unspecified osteoarthritis, unspecified site: Secondary | ICD-10-CM | POA: Diagnosis not present

## 2020-11-17 DIAGNOSIS — Z79899 Other long term (current) drug therapy: Secondary | ICD-10-CM | POA: Diagnosis not present

## 2020-11-17 DIAGNOSIS — M79672 Pain in left foot: Secondary | ICD-10-CM | POA: Diagnosis not present

## 2020-11-17 DIAGNOSIS — M79641 Pain in right hand: Secondary | ICD-10-CM | POA: Diagnosis not present

## 2020-11-17 DIAGNOSIS — M79642 Pain in left hand: Secondary | ICD-10-CM | POA: Diagnosis not present

## 2020-11-17 DIAGNOSIS — M25551 Pain in right hip: Secondary | ICD-10-CM | POA: Diagnosis not present

## 2020-11-17 DIAGNOSIS — M533 Sacrococcygeal disorders, not elsewhere classified: Secondary | ICD-10-CM | POA: Diagnosis not present

## 2020-11-22 ENCOUNTER — Encounter: Payer: Self-pay | Admitting: Orthopedic Surgery

## 2020-11-22 NOTE — Progress Notes (Signed)
This encounter was created in error - please disregard.

## 2020-11-23 ENCOUNTER — Encounter: Payer: Self-pay | Admitting: Adult Health

## 2020-11-24 ENCOUNTER — Encounter: Payer: Self-pay | Admitting: Adult Health

## 2020-12-08 DIAGNOSIS — M79643 Pain in unspecified hand: Secondary | ICD-10-CM | POA: Diagnosis not present

## 2020-12-08 DIAGNOSIS — M1990 Unspecified osteoarthritis, unspecified site: Secondary | ICD-10-CM | POA: Diagnosis not present

## 2020-12-08 DIAGNOSIS — M7989 Other specified soft tissue disorders: Secondary | ICD-10-CM | POA: Diagnosis not present

## 2020-12-08 DIAGNOSIS — M2550 Pain in unspecified joint: Secondary | ICD-10-CM | POA: Diagnosis not present

## 2021-01-06 DIAGNOSIS — M0609 Rheumatoid arthritis without rheumatoid factor, multiple sites: Secondary | ICD-10-CM | POA: Diagnosis not present

## 2021-01-20 DIAGNOSIS — M0609 Rheumatoid arthritis without rheumatoid factor, multiple sites: Secondary | ICD-10-CM | POA: Diagnosis not present

## 2021-01-20 DIAGNOSIS — Z79899 Other long term (current) drug therapy: Secondary | ICD-10-CM | POA: Diagnosis not present

## 2021-01-20 DIAGNOSIS — M1990 Unspecified osteoarthritis, unspecified site: Secondary | ICD-10-CM | POA: Diagnosis not present

## 2021-02-08 ENCOUNTER — Encounter: Payer: Self-pay | Admitting: Adult Health

## 2021-02-08 ENCOUNTER — Telehealth: Payer: BC Managed Care – PPO | Admitting: Adult Health

## 2021-02-08 VITALS — HR 110 | Temp 100.4°F | Ht 61.0 in | Wt 216.0 lb

## 2021-02-08 DIAGNOSIS — J988 Other specified respiratory disorders: Secondary | ICD-10-CM

## 2021-02-08 DIAGNOSIS — G4733 Obstructive sleep apnea (adult) (pediatric): Secondary | ICD-10-CM | POA: Diagnosis not present

## 2021-02-08 MED ORDER — HYDROCODONE BIT-HOMATROP MBR 5-1.5 MG/5ML PO SOLN: 5.0000 mL | Freq: Three times a day (TID) | ORAL | 0 refills | Status: DC | PRN

## 2021-02-08 MED ORDER — AMOXICILLIN-POT CLAVULANATE 875-125 MG PO TABS: 1.0000 | ORAL_TABLET | Freq: Two times a day (BID) | ORAL | 0 refills | Status: DC

## 2021-02-08 NOTE — Progress Notes (Signed)
Virtual Visit via Video Note  I connected with Katherine Rosario  on 02/08/21 at  5:00 PM EDT by a video enabled telemedicine application and verified that I am speaking with the correct person using two identifiers.  Location patient: home Location provider:work or home office Persons participating in the virtual visit: patient, provider  I discussed the limitations of evaluation and management by telemedicine and the availability of in person appointments. The patient expressed understanding and agreed to proceed.   HPI: 50 year old female who is being evaluated today for extreme fatigue, low-grade fever, cough.  Symptoms of low-grade fever and cough started 3 to 4 days ago, cough has from a productive cough to a dry cough.  Her fatigue has been an ongoing issue.  She does have a history of OSA but has not worn her CPAP in 6 to 8 months as it has broken.  Has not been seen by pulmonary since 2016.  She has been keeping an eye on her pulse ox at home with readings between 86 and 97% on room air.  She does feel as though she is wheezing more and has some labored breathing but no overt shortness of breath.  She is on Methotrexate for RA.    Past Medical History:  Diagnosis Date  . Allergic rhinitis   . Anemia    Heavy menstrual flow/OTC FE BID:    . Asthma   . Fibroids   . GERD (gastroesophageal reflux disease)   . Heart palpitations   . Hyperlipidemia   . Migraines   . OSA (obstructive sleep apnea) 05/06/2014    Social History   Socioeconomic History  . Marital status: Married    Spouse name: Not on file  . Number of children: Not on file  . Years of education: Not on file  . Highest education level: Not on file  Occupational History  . Occupation: Medical illustrator: [employer]  Tobacco Use  . Smoking status: Never Smoker  . Smokeless tobacco: Current User    Types: Snuff  Vaping Use  . Vaping Use: Never used  Substance and Sexual Activity  .  Alcohol use: Yes    Alcohol/week: 0.0 standard drinks    Comment: occ  . Drug use: No  . Sexual activity: Never  Other Topics Concern  . 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      Pt lives with wife, in a one story home. Has some college education.       She likes to hike and swim.    Social Determinants of Health   Financial Resource Strain: Not on file  Food Insecurity: Not on file  Transportation Needs: Not on file  Physical Activity: Not on file  Stress: Not on file  Social Connections: Not on file  Intimate Partner Violence: Not on file    Past Surgical History:  Procedure Laterality Date  . UTERINE FIBROID EMBOLIZATION      Family History  Problem Relation Age of Onset  . Hyperlipidemia Mother   . Hypertension Mother   . Colon cancer Father 52  . Hyperlipidemia Father   . Hypertension Father   . Prostate cancer Father 44  . Heart attack Neg Hx   . Stroke Neg Hx     No Known Allergies  Current Outpatient Medications on File Prior to Visit  Medication Sig Dispense Refill  . albuterol (PROVENTIL HFA;VENTOLIN HFA)  108 (90 BASE) MCG/ACT inhaler Inhale 2 puffs into the lungs every 4 (four) hours as needed for shortness of breath.    . citalopram (CELEXA) 10 MG tablet TAKE 1 TABLET BY MOUTH EVERY DAY 90 tablet 1  . fluticasone (FLONASE) 50 MCG/ACT nasal spray Place 2 sprays into both nostrils daily. 16 g 6  . omeprazole (PRILOSEC) 20 MG capsule Take 1 capsule (20 mg total) by mouth daily. 90 capsule 3  . rosuvastatin (CRESTOR) 5 MG tablet Take 1 tablet (5 mg total) by mouth at bedtime. 90 tablet 3  . SUMAtriptan (IMITREX) 100 MG tablet ONSET of migraine;repeat in 2 hours if needed 27 tablet 2   No current facility-administered medications on file prior to visit.    Pulse (!) 110   Temp (!) 100.4 F (38 C) (Oral)   Ht 5\' 1"  (1.549 m)   Wt 216 lb (98 kg)   SpO2 97%   BMI 40.81 kg/m      ROS: See pertinent positives and negatives per HPI.  Past Medical History:  Diagnosis Date  . Allergic rhinitis   . Anemia    Heavy menstrual flow/OTC FE BID:    . Asthma   . Fibroids   . GERD (gastroesophageal reflux disease)   . Heart palpitations   . Hyperlipidemia   . Migraines   . OSA (obstructive sleep apnea) 05/06/2014    Past Surgical History:  Procedure Laterality Date  . UTERINE FIBROID EMBOLIZATION      Family History  Problem Relation Age of Onset  . Hyperlipidemia Mother   . Hypertension Mother   . Colon cancer Father 17  . Hyperlipidemia Father   . Hypertension Father   . Prostate cancer Father 21  . Heart attack Neg Hx   . Stroke Neg Hx        Current Outpatient Medications:  .  albuterol (PROVENTIL HFA;VENTOLIN HFA) 108 (90 BASE) MCG/ACT inhaler, Inhale 2 puffs into the lungs every 4 (four) hours as needed for shortness of breath., Disp: , Rfl:  .  citalopram (CELEXA) 10 MG tablet, TAKE 1 TABLET BY MOUTH EVERY DAY, Disp: 90 tablet, Rfl: 1 .  fluticasone (FLONASE) 50 MCG/ACT nasal spray, Place 2 sprays into both nostrils daily., Disp: 16 g, Rfl: 6 .  omeprazole (PRILOSEC) 20 MG capsule, Take 1 capsule (20 mg total) by mouth daily., Disp: 90 capsule, Rfl: 3 .  rosuvastatin (CRESTOR) 5 MG tablet, Take 1 tablet (5 mg total) by mouth at bedtime., Disp: 90 tablet, Rfl: 3 .  SUMAtriptan (IMITREX) 100 MG tablet, ONSET of migraine;repeat in 2 hours if needed, Disp: 27 tablet, Rfl: 2  EXAM:  VITALS per patient if applicable:  GENERAL: alert, oriented, appears well and in no acute distress  HEENT: atraumatic, conjunttiva clear, no obvious abnormalities on inspection of external nose and ears  NECK: normal movements of the head and neck  LUNGS: on inspection no signs of respiratory distress, breathing rate appears normal, no obvious gross SOB, gasping or wheezing  CV: no obvious cyanosis  MS: moves all visible extremities without noticeable  abnormality  PSYCH/NEURO: pleasant and cooperative, no obvious depression or anxiety, speech and thought processing grossly intact  ASSESSMENT AND PLAN:  Discussed the following assessment and plan:  1. Respiratory infection - concern for pnuemonia d/t symptoms. Will treat with Augmentin.  - amoxicillin-clavulanate (AUGMENTIN) 875-125 MG tablet; Take 1 tablet by mouth 2 (two) times daily.  Dispense: 20 tablet; Refill: 0 - HYDROcodone bit-homatropine (HYCODAN) 5-1.5 MG/5ML  syrup; Take 5 mLs by mouth every 8 (eight) hours as needed for cough.  Dispense: 120 mL; Refill: 0  2. OSA (obstructive sleep apnea)  - Ambulatory referral to Pulmonology  I discussed the assessment and treatment plan with the patient. The patient was provided an opportunity to ask questions and all were answered. The patient agreed with the plan and demonstrated an understanding of the instructions.   The patient was advised to call back or seek an in-person evaluation if the symptoms worsen or if the condition fails to improve as anticipated.   Dorothyann Peng, NP

## 2021-02-09 ENCOUNTER — Encounter: Payer: Self-pay | Admitting: Adult Health

## 2021-02-09 NOTE — Telephone Encounter (Signed)
What days would you like for me to put for the pt?

## 2021-02-10 ENCOUNTER — Encounter: Payer: Self-pay | Admitting: Adult Health

## 2021-02-11 ENCOUNTER — Other Ambulatory Visit: Payer: Self-pay | Admitting: Adult Health

## 2021-02-11 DIAGNOSIS — F4321 Adjustment disorder with depressed mood: Secondary | ICD-10-CM

## 2021-02-15 ENCOUNTER — Encounter: Payer: Self-pay | Admitting: Adult Health

## 2021-02-25 ENCOUNTER — Telehealth: Payer: Self-pay | Admitting: Adult Health

## 2021-02-25 NOTE — Telephone Encounter (Signed)
Patient dropped off paperwork to be completed by [facility]. She says that she has an envelope and everything needed with the paperwork.  Patient says the forms should be mailed to Katherine Rosario.  Paperwork will be placed in folder.  Please advise.

## 2021-02-25 NOTE — Telephone Encounter (Signed)
Noted  

## 2021-02-28 NOTE — Telephone Encounter (Signed)
FYI

## 2021-03-02 ENCOUNTER — Encounter: Payer: Self-pay | Admitting: Family Medicine

## 2021-03-02 ENCOUNTER — Other Ambulatory Visit: Payer: Self-pay

## 2021-03-02 ENCOUNTER — Ambulatory Visit: Payer: BC Managed Care – PPO | Admitting: Family Medicine

## 2021-03-02 VITALS — BP 116/80 | HR 96 | Temp 98.9°F | Ht 61.0 in | Wt 215.6 lb

## 2021-03-02 DIAGNOSIS — R42 Dizziness and giddiness: Secondary | ICD-10-CM | POA: Diagnosis not present

## 2021-03-02 DIAGNOSIS — H6692 Otitis media, unspecified, left ear: Secondary | ICD-10-CM

## 2021-03-02 DIAGNOSIS — L255 Unspecified contact dermatitis due to plants, except food: Secondary | ICD-10-CM | POA: Diagnosis not present

## 2021-03-02 MED ORDER — MECLIZINE HCL 12.5 MG PO TABS: 12.5000 mg | ORAL_TABLET | Freq: Three times a day (TID) | ORAL | 0 refills | Status: DC | PRN

## 2021-03-02 MED ORDER — CEFDINIR 300 MG PO CAPS: 300.0000 mg | ORAL_CAPSULE | Freq: Two times a day (BID) | ORAL | 0 refills | Status: AC

## 2021-03-02 NOTE — Progress Notes (Signed)
Subjective:    Patient ID: Katherine Rosario, female    DOB: 03/24/1971, 50 y.o.   MRN: [id number]  Chief Complaint  Patient presents with   Dizziness    Going on since sunday    HPI Patient was seen today for acute conerns.  Pt endorses n/v and decreased focus [place].  On Monday pt woke up with dizziness.  Pt's boss had to drive her home from work on Tuesday 2/2 symptoms increasing.  Pt feels her eyes darting back and forth.  Feels like she looks like she is drunk when walking.  Pt with pruritic rash on R wrist after trimming bushes in yard over the wknd.  Pt was wearing gloves, but the area is just above where the gloves covered.  Pt may have also come into contact with poison ivy from her dog who sleeps in the bed with her.  Past Medical History:  Diagnosis Date   Allergic rhinitis    Anemia    Heavy menstrual flow/OTC FE BID:     Asthma    Fibroids    GERD (gastroesophageal reflux disease)    Heart palpitations    Hyperlipidemia    Migraines    OSA (obstructive sleep apnea) 05/06/2014   Family History  Problem Relation Age of Onset   Hyperlipidemia Mother    Hypertension Mother    Colon cancer Father 3   Hyperlipidemia Father    Hypertension Father    Prostate cancer Father 58   Heart attack Neg Hx    Stroke Neg Hx     No Known Allergies  ROS General: Denies fever, chills, night sweats, changes in weight, changes in appetite +dizziness HEENT: Denies headaches, ear pain, changes in vision, rhinorrhea, sore throat CV: Denies CP, palpitations, SOB, orthopnea Pulm: Denies SOB, cough, wheezing GI: Denies abdominal pain, diarrhea, constipation +n/v GU: Denies dysuria, hematuria, frequency, vaginal discharge Msk: Denies muscle cramps, joint pains Neuro: Denies weakness, numbness, tingling Skin: Denies bruising +pruritic rash Psych: Denies depression, anxiety, hallucinations     Objective:    Blood pressure 116/80, pulse 96,  temperature 98.9 F (37.2 C), temperature source Oral, height 5\' 1"  (1.549 m), weight 215 lb 9.6 oz (97.8 kg), SpO2 98 %.  Gen. Pleasant, well-nourished, in no distress, normal affect  HEENT: [place]/AT, face symmetric, conjunctiva clear, no scleral icterus, PERRLA, EOMI, no nystagmus, nares patent without drainage, pharynx without erythema or exudate.  L external ear and canal normal.  L TM full with mild erythema.  R TM full.  Dix-hallpike maneuver performed. Lungs: no accessory muscle use Cardiovascular: RRR, no peripheral edema Musculoskeletal: No deformities, no cyanosis or clubbing, normal tone Neuro:  A&Ox3, CN II-XII intact, normal gait Skin:  Warm, dry, intact.  Dried vesicles in a linear distribution with a small area forming a plaque on R anterior wrist.  Wt Readings from Last 3 Encounters:  03/02/21 215 lb 9.6 oz (97.8 kg)  02/08/21 216 lb (98 kg)  11/10/20 216 lb 3.2 oz (98.1 kg)    Lab Results  Component Value Date   WBC 6.4 11/10/2020   HGB 13.8 11/10/2020   HCT 41.1 11/10/2020   PLT 337.0 11/10/2020   GLUCOSE 92 11/10/2020   CHOL 216 (H) 02/19/2020   TRIG 83.0 02/19/2020   HDL 52.50 02/19/2020   LDLDIRECT 150.8 08/25/2013   LDLCALC 147 (H) 02/19/2020   ALT 30 11/10/2020   AST 23 11/10/2020   NA 140 11/10/2020  K 4.4 11/10/2020   CL 106 11/10/2020   CREATININE 0.81 11/10/2020   BUN 16 11/10/2020   CO2 23 11/10/2020   TSH 2.77 02/19/2020   INR 0.97 02/07/2013   HGBA1C 5.5 05/11/2017    Assessment/Plan:  Left acute otitis media  -supportive care -recent Augmentin use.  Will start cephalosporin.  NKDA. - Plan: cefdinir (OMNICEF) 300 MG capsule  Dizziness -discussed possible causes including AOM, allergies, dehydration, vertigo -consent obtained.  Dix-Hallpike maneuver performed.  Pt tolerated procedure well.  - Plan: meclizine (ANTIVERT) 12.5 MG tablet  Toxicodendron dermatitis -supportive care: antihistamine, OTC topical agents -avoidance -given  handout  Pt given letter for work.  F/u prn  Grier Mitts, MD

## 2021-03-07 DIAGNOSIS — L255 Unspecified contact dermatitis due to plants, except food: Secondary | ICD-10-CM | POA: Diagnosis not present

## 2021-03-07 DIAGNOSIS — B372 Candidiasis of skin and nail: Secondary | ICD-10-CM | POA: Diagnosis not present

## 2021-03-09 ENCOUNTER — Emergency Department (HOSPITAL_COMMUNITY)
Admission: EM | Admit: 2021-03-09 | Discharge: 2021-03-09 | Disposition: A | Discharge status: Home / Self Care | Payer: BC Managed Care – PPO | Attending: Emergency Medicine | Admitting: Emergency Medicine

## 2021-03-09 ENCOUNTER — Encounter (HOSPITAL_COMMUNITY): Payer: Self-pay

## 2021-03-09 ENCOUNTER — Other Ambulatory Visit: Payer: Self-pay

## 2021-03-09 DIAGNOSIS — Z79899 Other long term (current) drug therapy: Secondary | ICD-10-CM | POA: Diagnosis not present

## 2021-03-09 DIAGNOSIS — L237 Allergic contact dermatitis due to plants, except food: Secondary | ICD-10-CM | POA: Diagnosis not present

## 2021-03-09 DIAGNOSIS — Z20822 Contact with and (suspected) exposure to covid-19: Secondary | ICD-10-CM | POA: Diagnosis not present

## 2021-03-09 DIAGNOSIS — B372 Candidiasis of skin and nail: Secondary | ICD-10-CM

## 2021-03-09 DIAGNOSIS — B9789 Other viral agents as the cause of diseases classified elsewhere: Secondary | ICD-10-CM | POA: Diagnosis not present

## 2021-03-09 DIAGNOSIS — R059 Cough, unspecified: Secondary | ICD-10-CM | POA: Diagnosis not present

## 2021-03-09 DIAGNOSIS — J069 Acute upper respiratory infection, unspecified: Secondary | ICD-10-CM | POA: Diagnosis not present

## 2021-03-09 DIAGNOSIS — J45909 Unspecified asthma, uncomplicated: Secondary | ICD-10-CM | POA: Diagnosis not present

## 2021-03-09 LAB — RESP PANEL BY RT-PCR (FLU A&B, COVID) ARPGX2
Influenza A by PCR: NEGATIVE
Influenza B by PCR: NEGATIVE
SARS Coronavirus 2 by RT PCR: NEGATIVE

## 2021-03-09 LAB — CBG MONITORING, ED: Glucose-Capillary: 105 mg/dL — ABNORMAL HIGH (ref 70–99)

## 2021-03-09 MED ORDER — CLOBETASOL PROPIONATE 0.05 % EX CREA: TOPICAL_CREAM | CUTANEOUS | 0 refills | Status: DC

## 2021-03-09 MED ORDER — OXYMETAZOLINE HCL 0.05 % NA SOLN
2.0000 | Freq: Two times a day (BID) | NASAL | Status: DC | PRN
  Administered 2021-03-09: 2 via NASAL
  Filled 2021-03-09: qty 30

## 2021-03-09 MED ORDER — ALBUTEROL SULFATE HFA 108 (90 BASE) MCG/ACT IN AERS: 2.0000 | INHALATION_SPRAY | RESPIRATORY_TRACT | Status: DC | PRN

## 2021-03-09 MED ORDER — ALBUTEROL SULFATE HFA 108 (90 BASE) MCG/ACT IN AERS
2.0000 | INHALATION_SPRAY | RESPIRATORY_TRACT | Status: DC | PRN
  Administered 2021-03-09: 2 via RESPIRATORY_TRACT
  Filled 2021-03-09: qty 6.7

## 2021-03-09 NOTE — ED Triage Notes (Signed)
Pt to Ed from home with c/o poison ivy exposure to face, abdomen and groin. Also c/o cough which began today. Pt was seen at [facility] yesterday for skin irritations, was told she also had a yeast infection, states the irritation is getting worse. HR is elevtaed in triage.

## 2021-03-09 NOTE — ED Provider Notes (Signed)
[facility] DEPT Provider Note: Georgena Spurling, MD, FACEP  CSN: [id number] MRN: [id number] ARRIVAL: 03/09/21 at [place]: [place]  Cough   HISTORY OF PRESENT ILLNESS  03/09/21 1:00 AM Katherine Rosario is a 50 y.o. female who believes she was exposed to poison ivy at her apartment house when she was cutting weeds about a week ago.  She has a rash to her face that has gradually developed over the past week and worsened yesterday.  She was seen at an urgent care a day or two ago and given a shot of steroids.  She was also noted to have an intertrigo rash of her abdominal and groin folds as well as her submammary folds.  She was given a prescription for Diflucan tablets as well as a topical antifungal (she thinks Lamisil, which she has started using).  Her facial rash is worse this morning than it was yesterday.  Her rashes are both itchy.  She rates the associated discomfort as a 5 out of 10.  Her principal reason for being here this morning is that she developed a cough yesterday.  The cough is dry and fairly persistent.  She was noted to have a low-grade fever of 99.9 on arrival.  She was noted to be hypertensive and tachycardic on arrival but these have improved.  Her current BP is 102 systolic and her heart rate is 103.   Past Medical History:  Diagnosis Date   Allergic rhinitis    Anemia    Heavy menstrual flow/OTC FE BID:     Asthma    Fibroids    GERD (gastroesophageal reflux disease)    Heart palpitations    Hyperlipidemia    Migraines    OSA (obstructive sleep apnea) 05/06/2014    Past Surgical History:  Procedure Laterality Date   UTERINE FIBROID EMBOLIZATION      Family History  Problem Relation Age of Onset   Hyperlipidemia Mother    Hypertension Mother    Colon cancer Father 20   Hyperlipidemia Father    Hypertension Father    Prostate cancer Father 57   Heart attack Neg Hx    Stroke Neg Hx     Social History   Tobacco Use   Smoking  status: Never   Smokeless tobacco: Current    Types: Snuff  Vaping Use   Vaping Use: Never used  Substance Use Topics   Alcohol use: Yes    Alcohol/week: 0.0 standard drinks    Comment: occ   Drug use: No    Prior to Admission medications   Medication Sig Start Date End Date Taking? Authorizing Provider  clobetasol cream (TEMOVATE) 0.05 % Apply to facial rash twice daily.  Apply sparingly. 03/09/21  Yes Tenasia Aull, MD  cefdinir (OMNICEF) 300 MG capsule Take 1 capsule (300 mg total) by mouth 2 (two) times daily for 7 days. 03/02/21 03/09/21  Billie Ruddy, MD  citalopram (CELEXA) 10 MG tablet TAKE 1 TABLET BY MOUTH EVERY DAY 02/11/21   Nafziger, Tommi Rumps, NP  fluticasone (FLONASE) 50 MCG/ACT nasal spray Place 2 sprays into both nostrils daily. 05/31/18   Nafziger, Tommi Rumps, NP  HYDROcodone bit-homatropine (HYCODAN) 5-1.5 MG/5ML syrup Take 5 mLs by mouth every 8 (eight) hours as needed for cough. 02/08/21   Nafziger, Tommi Rumps, NP  meclizine (ANTIVERT) 12.5 MG tablet Take 1 tablet (12.5 mg total) by mouth 3 (three) times daily as needed for dizziness. 03/02/21   Billie Ruddy, MD  methotrexate 2.5 MG tablet Take 8 tablets by mouth once a week.    [provider]  omeprazole (PRILOSEC) 20 MG capsule Take 1 capsule (20 mg total) by mouth daily. 02/17/20   Nafziger, Tommi Rumps, NP  rosuvastatin (CRESTOR) 5 MG tablet TAKE 1 TABLET BY MOUTH EVERYDAY AT BEDTIME 02/11/21   Nafziger, Tommi Rumps, NP  SUMAtriptan (IMITREX) 100 MG tablet ONSET of migraine;repeat in 2 hours if needed 02/18/20   Dorothyann Peng, NP    Allergies Patient has no known allergies.   REVIEW OF SYSTEMS  Negative except as noted here or in the History of Present Illness.   PHYSICAL EXAMINATION  Initial Vital Signs Blood pressure (!) 150/115, pulse (!) 131, temperature 99.9 F (37.7 C), temperature source Oral, resp. rate (!) 24, SpO2 100 %.  Examination General: Well-developed, well-nourished female in no acute distress; appearance  consistent with age of record HENT: normocephalic; atraumatic Eyes: Normal appearance Neck: supple Heart: regular rate and rhythm Lungs: clear to auscultation bilaterally Abdomen: soft; nondistended; nontender; bowel sounds present Extremities: No deformity; full range of motion; pulses normal Neurologic: Awake, alert and oriented; motor function intact in all extremities and symmetric; no facial droop Skin: Warm and dry; confluent, erythematous intertriginous rash of the abdominal pannus, groin folds and submammary folds; irregular rash of the face and upper chest:    Psychiatric: Normal mood and affect   RESULTS  Summary of this visit's results, reviewed and interpreted by myself:   EKG Interpretation  Date/Time:    Ventricular Rate:    PR Interval:    QRS Duration:   QT Interval:    QTC Calculation:   R Axis:     Text Interpretation:          Laboratory Studies: Results for orders placed or performed during the hospital encounter of 03/09/21 (from the past 24 hour(s))  Resp Panel by RT-PCR (Flu A&B, Covid) Nasopharyngeal Swab     Status: None   Collection Time: 03/09/21  1:02 AM   Specimen: Nasopharyngeal Swab; Nasopharyngeal(NP) swabs in vial transport medium  Result Value Ref Range   SARS Coronavirus 2 by RT PCR NEGATIVE NEGATIVE   Influenza A by PCR NEGATIVE NEGATIVE   Influenza B by PCR NEGATIVE NEGATIVE  CBG monitoring, ED     Status: Abnormal   Collection Time: 03/09/21  1:33 AM  Result Value Ref Range   Glucose-Capillary 105 (H) 70 - 99 mg/dL   Imaging Studies: No results found.  ED COURSE and MDM  Nursing notes, initial and subsequent vitals signs, including pulse oximetry, reviewed and interpreted by myself.  Vitals:   03/09/21 0014 03/09/21 0015 03/09/21 0100 03/09/21 0130  BP: (!) 150/115 (!) 144/99 (!) 133/91 (!) 133/92  Pulse: (!) 131 (!) 119 (!) 108 100  Resp: (!) 24  17 20   Temp: 99.9 F (37.7 C)     TempSrc: Oral     SpO2: 100% 98% 98%  98%   Medications  albuterol (VENTOLIN HFA) 108 (90 Base) MCG/ACT inhaler 2 puff (2 puffs Inhalation Given 03/09/21 0137)  oxymetazoline (AFRIN) 0.05 % nasal spray 2 spray (2 sprays Each Nare Given 03/09/21 0137)   2:14 AM The patient is COVID and influenza negative but may benefit from an inhaler for what appears to be a viral illness.  Because systemic steroids may worsen her candidal intertrigo we will use topical steroid treatment for her facial rash, consistent with poison ivy or poison oak.  The patient is already being treated for her  candidal intertrigo.  Her blood sugar is 105 and she was advised that diabetes is a risk factor for candidal infections and obesity is a risk factor for diabetes.  She should be vigilant about her blood sugars in the future.   PROCEDURES  Procedures   ED DIAGNOSES     ICD-10-CM   1. Viral URI with cough  J06.9     2. Candidal intertrigo  B37.2     3. Allergic dermatitis due to poison ivy  L23.7     4. COVID-19 virus not detected  Z20.822          Shanon Rosser, MD 03/09/21 [phone]

## 2021-03-16 ENCOUNTER — Other Ambulatory Visit: Payer: Self-pay

## 2021-03-16 ENCOUNTER — Ambulatory Visit: Payer: BC Managed Care – PPO | Admitting: Family Medicine

## 2021-03-17 ENCOUNTER — Ambulatory Visit: Payer: BC Managed Care – PPO | Admitting: Adult Health

## 2021-03-17 ENCOUNTER — Encounter: Payer: Self-pay | Admitting: Adult Health

## 2021-03-17 VITALS — BP 110/60 | HR 81 | Temp 98.6°F | Ht 61.0 in | Wt 207.0 lb

## 2021-03-17 DIAGNOSIS — M1990 Unspecified osteoarthritis, unspecified site: Secondary | ICD-10-CM | POA: Diagnosis not present

## 2021-03-17 DIAGNOSIS — L237 Allergic contact dermatitis due to plants, except food: Secondary | ICD-10-CM

## 2021-03-17 DIAGNOSIS — Z79899 Other long term (current) drug therapy: Secondary | ICD-10-CM | POA: Diagnosis not present

## 2021-03-17 DIAGNOSIS — M0609 Rheumatoid arthritis without rheumatoid factor, multiple sites: Secondary | ICD-10-CM | POA: Diagnosis not present

## 2021-03-17 MED ORDER — PREDNISONE 10 MG PO TABS: ORAL_TABLET | ORAL | 0 refills | Status: DC

## 2021-03-17 MED ORDER — HYDROXYZINE PAMOATE 25 MG PO CAPS: 25.0000 mg | ORAL_CAPSULE | Freq: Three times a day (TID) | ORAL | 0 refills | Status: DC | PRN

## 2021-03-17 NOTE — Progress Notes (Signed)
Subjective:    Patient ID: Katherine Rosario, female    DOB: [birth year], 50 y.o.   MRN: [id number]  HPI 50 year old female who  has a past medical history of Allergic rhinitis, Anemia, Asthma, Fibroids, GERD (gastroesophageal reflux disease), Heart palpitations, Hyperlipidemia, Migraines, and OSA (obstructive sleep apnea) (05/06/2014).  She was seen 8 days ago in the emergency room after being exposed to poison ivy at her home.  She was cutting weeds and pulling which she thought was just [place] ivy but there was poison ivy and tangled.  She had significant contact dermatitis to her face, abdomen, legs, and arms.  At this time she also had significant swelling to her orbits as well as feeling as though she had a tickle in the back of her throat and had developed a cough.  She was given steroid injection and then released.  Today she reports that her facial rash and swelling are much improved but she continues to have red itchy rash throughout the rest of her body.  She has been using prescription steroid cream which is helping minimally.  Using Benadryl to help with this pruritus   Review of Systems See HPI   Past Medical History:  Diagnosis Date   Allergic rhinitis    Anemia    Heavy menstrual flow/OTC FE BID:     Asthma    Fibroids    GERD (gastroesophageal reflux disease)    Heart palpitations    Hyperlipidemia    Migraines    OSA (obstructive sleep apnea) 05/06/2014    Social History   Socioeconomic History   Marital status: Married    Spouse name: Not on file   Number of children: Not on file   Years of education: Not on file   Highest education level: Not on file  Occupational History   Occupation: Radiation protection practitioner    Employer: [employer]  Tobacco Use   Smoking status: Never   Smokeless tobacco: Current    Types: Snuff  Vaping Use   Vaping Use: Never used  Substance and Sexual Activity   Alcohol use: Yes    Alcohol/week: 0.0 standard drinks     Comment: occ   Drug use: No   Sexual activity: Never  Other Topics Concern   Not on file  Social History Narrative   married to female partner [name] 12/2012, 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      Pt lives with wife, in a one story home. Has some college education.       She likes to hike and swim.    Social Determinants of Health   Financial Resource Strain: Not on file  Food Insecurity: Not on file  Transportation Needs: Not on file  Physical Activity: Not on file  Stress: Not on file  Social Connections: Not on file  Intimate Partner Violence: Not on file    Past Surgical History:  Procedure Laterality Date   UTERINE FIBROID EMBOLIZATION      Family History  Problem Relation Age of Onset   Hyperlipidemia Mother    Hypertension Mother    Colon cancer Father 33   Hyperlipidemia Father    Hypertension Father    Prostate cancer Father 30   Heart attack Neg Hx    Stroke Neg Hx     No Known Allergies  Current Outpatient Medications on File Prior to Visit  Medication Sig Dispense Refill   citalopram (CELEXA) 10 MG tablet TAKE 1 TABLET BY MOUTH EVERY DAY  90 tablet 1   clobetasol cream (TEMOVATE) 0.05 % Apply to facial rash twice daily.  Apply sparingly. 30 g 0   fluticasone (FLONASE) 50 MCG/ACT nasal spray Place 2 sprays into both nostrils daily. 16 g 6   HYDROcodone bit-homatropine (HYCODAN) 5-1.5 MG/5ML syrup Take 5 mLs by mouth every 8 (eight) hours as needed for cough. 120 mL 0   methotrexate 2.5 MG tablet Take 8 tablets by mouth once a week.     omeprazole (PRILOSEC) 20 MG capsule Take 1 capsule (20 mg total) by mouth daily. 90 capsule 3   rosuvastatin (CRESTOR) 5 MG tablet TAKE 1 TABLET BY MOUTH EVERYDAY AT BEDTIME 90 tablet 3   SUMAtriptan (IMITREX) 100 MG tablet ONSET of migraine;repeat in 2 hours if needed 27 tablet 2   meclizine (ANTIVERT) 12.5 MG tablet Take 1 tablet (12.5 mg total) by mouth 3 (three) times daily as needed for dizziness. 30 tablet 0   No  current facility-administered medications on file prior to visit.    BP 110/60   Pulse 81   Temp 98.6 F (37 C) (Oral)   Ht 5\' 1"  (1.549 m)   Wt 207 lb (93.9 kg)   SpO2 97%   BMI 39.11 kg/m       Objective:   Physical Exam Vitals and nursing note reviewed.  Constitutional:      Appearance: Normal appearance.  Musculoskeletal:        General: Normal range of motion.  Skin:    General: Skin is warm and dry.     Findings: Erythema and rash present. Rash is vesicular.     Comments: Multiple areas of red raised vesicular rash noted throughout body including face, bilateral upper and lower extremities as well as torso.  Neurological:     General: No focal deficit present.     Mental Status: She is alert and oriented to person, place, and time.  Psychiatric:        Mood and Affect: Mood normal.        Behavior: Behavior normal.        Thought Content: Thought content normal.      Assessment & Plan:   1. Poison ivy dermatitis -Rash consistent with poison ivy dermatitis likely having rebound.  Will place on 14 days of prednisone and prescribed Vistaril for pruritus.  She will not take this with Benadryl. - hydrOXYzine (VISTARIL) 25 MG capsule; Take 1 capsule (25 mg total) by mouth every 8 (eight) hours as needed.  Dispense: 30 capsule; Refill: 0 - predniSONE (DELTASONE) 10 MG tablet; Two tabs ( 20 mg) x 7 days then 1 tab ( 10mg ) 7 days  Dispense: 21 tablet; Refill: 0 - Follow up as needed  Dorothyann Peng, NP

## 2021-03-22 ENCOUNTER — Institutional Professional Consult (permissible substitution): Payer: BC Managed Care – PPO | Admitting: Pulmonary Disease

## 2021-03-22 DIAGNOSIS — S61219A Laceration without foreign body of unspecified finger without damage to nail, initial encounter: Secondary | ICD-10-CM | POA: Diagnosis not present

## 2021-03-22 DIAGNOSIS — S61441A Puncture wound with foreign body of right hand, initial encounter: Secondary | ICD-10-CM | POA: Diagnosis not present

## 2021-03-22 DIAGNOSIS — S51812A Laceration without foreign body of left forearm, initial encounter: Secondary | ICD-10-CM | POA: Diagnosis not present

## 2021-03-22 DIAGNOSIS — W540XXA Bitten by dog, initial encounter: Secondary | ICD-10-CM | POA: Diagnosis not present

## 2021-03-28 ENCOUNTER — Encounter: Payer: Self-pay | Admitting: Adult Health

## 2021-05-12 ENCOUNTER — Other Ambulatory Visit: Payer: Self-pay | Admitting: Adult Health

## 2021-05-12 ENCOUNTER — Encounter: Payer: Self-pay | Admitting: Adult Health

## 2021-05-12 DIAGNOSIS — Z1231 Encounter for screening mammogram for malignant neoplasm of breast: Secondary | ICD-10-CM

## 2021-05-19 ENCOUNTER — Encounter: Payer: Self-pay | Admitting: Adult Health

## 2021-05-19 NOTE — Telephone Encounter (Signed)
Spoke to pt and advised that the slot was not available. Pt informed that I could schedule appointment with another provider around the same time. Pt declined and stated that she wanted to speak with [facility]. I advised pt that if she becomes worse she needs to go to local UC or ED. Pt verbalized understanding.

## 2021-05-23 DIAGNOSIS — Z803 Family history of malignant neoplasm of breast: Secondary | ICD-10-CM | POA: Diagnosis not present

## 2021-05-23 DIAGNOSIS — Z01419 Encounter for gynecological examination (general) (routine) without abnormal findings: Secondary | ICD-10-CM | POA: Diagnosis not present

## 2021-05-23 DIAGNOSIS — Z800 Family history of malignant neoplasm of digestive organs: Secondary | ICD-10-CM | POA: Diagnosis not present

## 2021-05-23 DIAGNOSIS — Z6841 Body Mass Index (BMI) 40.0 and over, adult: Secondary | ICD-10-CM | POA: Diagnosis not present

## 2021-05-23 DIAGNOSIS — Z8042 Family history of malignant neoplasm of prostate: Secondary | ICD-10-CM | POA: Diagnosis not present

## 2021-05-24 ENCOUNTER — Encounter: Payer: Self-pay | Admitting: Adult Health

## 2021-05-24 ENCOUNTER — Telehealth: Payer: BC Managed Care – PPO | Admitting: Adult Health

## 2021-05-24 VITALS — HR 90 | Temp 99.0°F | Ht 61.0 in | Wt 215.0 lb

## 2021-05-24 DIAGNOSIS — J069 Acute upper respiratory infection, unspecified: Secondary | ICD-10-CM

## 2021-05-24 DIAGNOSIS — F4321 Adjustment disorder with depressed mood: Secondary | ICD-10-CM | POA: Diagnosis not present

## 2021-05-24 MED ORDER — AMOXICILLIN-POT CLAVULANATE 875-125 MG PO TABS: 1.0000 | ORAL_TABLET | Freq: Two times a day (BID) | ORAL | 0 refills | Status: DC

## 2021-05-24 NOTE — Progress Notes (Signed)
Virtual Visit via Video Note  I connected with Katherine Rosario on 05/24/21 at  4:30 PM EDT by a video enabled telemedicine application and verified that I am speaking with the correct person using two identifiers.  Location patient: home Location provider:work or home office Persons participating in the virtual visit: patient, provider  I discussed the limitations of evaluation and management by telemedicine and the availability of in person appointments. The patient expressed understanding and agreed to proceed.   HPI: 50 year old female who is being evaluated today for recurrent URI.  Her symptoms started roughly 11 days ago.  Symptoms include fever up to 102, currently 99 degrees, sinus pain and pressure, yellow-brown nasal drainage as well as a productive cough with yellow-brown mucus, and generalized fatigue.  She has been using over-the-counter Mucinex and Tylenol with some relief in her symptoms.  He took 2 COVID tests which were negative.  She did have her GYN exam yesterday and reports that her GYN listen to her lungs and they were clear.  Additionally, she reports that her depression has not been controlled for the last month or so.  Symptoms exacerbating depression include her brother being hospitalized with end-stage liver disease from alcoholic cirrhosis.  He is currently on Celexa 10 mg.   ROS: See pertinent positives and negatives per HPI.  Past Medical History:  Diagnosis Date   Allergic rhinitis    Anemia    Heavy menstrual flow/OTC FE BID:     Asthma    Fibroids    GERD (gastroesophageal reflux disease)    Heart palpitations    Hyperlipidemia    Migraines    OSA (obstructive sleep apnea) 05/06/2014    Past Surgical History:  Procedure Laterality Date   UTERINE FIBROID EMBOLIZATION      Family History  Problem Relation Age of Onset   Hyperlipidemia Mother    Hypertension Mother    Colon cancer Father 79   Hyperlipidemia Father    Hypertension Father     Prostate cancer Father 52   Heart attack Neg Hx    Stroke Neg Hx        Current Outpatient Medications:    amoxicillin-clavulanate (AUGMENTIN) 875-125 MG tablet, Take 1 tablet by mouth 2 (two) times daily., Disp: 20 tablet, Rfl: 0   citalopram (CELEXA) 10 MG tablet, TAKE 1 TABLET BY MOUTH EVERY DAY, Disp: 90 tablet, Rfl: 1   fluticasone (FLONASE) 50 MCG/ACT nasal spray, Place 2 sprays into both nostrils daily., Disp: 16 g, Rfl: 6   HYDROcodone bit-homatropine (HYCODAN) 5-1.5 MG/5ML syrup, Take 5 mLs by mouth every 8 (eight) hours as needed for cough., Disp: 120 mL, Rfl: 0   hydrOXYzine (VISTARIL) 25 MG capsule, Take 1 capsule (25 mg total) by mouth every 8 (eight) hours as needed., Disp: 30 capsule, Rfl: 0   methotrexate 2.5 MG tablet, Take 8 tablets by mouth once a week., Disp: , Rfl:    omeprazole (PRILOSEC) 20 MG capsule, Take 1 capsule (20 mg total) by mouth daily., Disp: 90 capsule, Rfl: 3   rosuvastatin (CRESTOR) 5 MG tablet, TAKE 1 TABLET BY MOUTH EVERYDAY AT BEDTIME, Disp: 90 tablet, Rfl: 3   SUMAtriptan (IMITREX) 100 MG tablet, ONSET of migraine;repeat in 2 hours if needed, Disp: 27 tablet, Rfl: 2  EXAM:  VITALS per patient if applicable:  GENERAL: alert, oriented, appears well and in no acute distress  HEENT: atraumatic, conjunttiva clear, no obvious abnormalities on inspection of external nose and ears  NECK: normal movements of  the head and neck  LUNGS: on inspection no signs of respiratory distress, breathing rate appears normal, no obvious gross SOB, gasping or wheezing  CV: no obvious cyanosis  MS: moves all visible extremities without noticeable abnormality  PSYCH/NEURO: pleasant and cooperative, no obvious depression or anxiety, speech and thought processing grossly intact  ASSESSMENT AND PLAN:  Discussed the following assessment and plan:  1. Upper respiratory tract infection, unspecified type  - amoxicillin-clavulanate (AUGMENTIN) 875-125 MG tablet; Take  1 tablet by mouth 2 (two) times daily.  Dispense: 20 tablet; Refill: 0  2. Situational depression - Will have her increase Celexa to 20 mg daily  - Follow up in one month or sooner if needed      I discussed the assessment and treatment plan with the patient. The patient was provided an opportunity to ask questions and all were answered. The patient agreed with the plan and demonstrated an understanding of the instructions.   The patient was advised to call back or seek an in-person evaluation if the symptoms worsen or if the condition fails to improve as anticipated.   Dorothyann Peng, NP

## 2021-06-14 ENCOUNTER — Ambulatory Visit
Admission: RE | Admit: 2021-06-14 | Discharge: 2021-06-14 | Disposition: A | Discharge status: Home / Self Care | Payer: BC Managed Care – PPO | Source: Ambulatory Visit | Attending: Adult Health | Admitting: Adult Health

## 2021-06-14 ENCOUNTER — Other Ambulatory Visit: Payer: Self-pay

## 2021-06-14 DIAGNOSIS — Z1231 Encounter for screening mammogram for malignant neoplasm of breast: Secondary | ICD-10-CM

## 2021-06-15 DIAGNOSIS — N870 Mild cervical dysplasia: Secondary | ICD-10-CM | POA: Diagnosis not present

## 2021-06-15 DIAGNOSIS — R87810 Cervical high risk human papillomavirus (HPV) DNA test positive: Secondary | ICD-10-CM | POA: Diagnosis not present

## 2021-06-15 DIAGNOSIS — R87611 Atypical squamous cells cannot exclude high grade squamous intraepithelial lesion on cytologic smear of cervix (ASC-H): Secondary | ICD-10-CM | POA: Diagnosis not present

## 2021-07-06 DIAGNOSIS — Z800 Family history of malignant neoplasm of digestive organs: Secondary | ICD-10-CM | POA: Diagnosis not present

## 2021-07-06 DIAGNOSIS — Z9189 Other specified personal risk factors, not elsewhere classified: Secondary | ICD-10-CM | POA: Diagnosis not present

## 2021-07-19 DIAGNOSIS — N870 Mild cervical dysplasia: Secondary | ICD-10-CM | POA: Diagnosis not present

## 2021-08-04 DIAGNOSIS — M0609 Rheumatoid arthritis without rheumatoid factor, multiple sites: Secondary | ICD-10-CM | POA: Diagnosis not present

## 2021-08-04 DIAGNOSIS — M1990 Unspecified osteoarthritis, unspecified site: Secondary | ICD-10-CM | POA: Diagnosis not present

## 2021-08-04 DIAGNOSIS — Z79899 Other long term (current) drug therapy: Secondary | ICD-10-CM | POA: Diagnosis not present

## 2021-08-05 ENCOUNTER — Encounter: Payer: Self-pay | Admitting: Adult Health

## 2021-08-05 ENCOUNTER — Other Ambulatory Visit: Payer: Self-pay | Admitting: Adult Health

## 2021-08-05 MED ORDER — PREDNISONE 10 MG PO TABS: ORAL_TABLET | ORAL | 0 refills | Status: DC

## 2021-11-03 DIAGNOSIS — M1990 Unspecified osteoarthritis, unspecified site: Secondary | ICD-10-CM | POA: Diagnosis not present

## 2021-11-03 DIAGNOSIS — M79643 Pain in unspecified hand: Secondary | ICD-10-CM | POA: Diagnosis not present

## 2021-11-03 DIAGNOSIS — M0609 Rheumatoid arthritis without rheumatoid factor, multiple sites: Secondary | ICD-10-CM | POA: Diagnosis not present

## 2021-11-03 DIAGNOSIS — Z79899 Other long term (current) drug therapy: Secondary | ICD-10-CM | POA: Diagnosis not present

## 2021-12-12 DIAGNOSIS — Z800 Family history of malignant neoplasm of digestive organs: Secondary | ICD-10-CM | POA: Diagnosis not present

## 2021-12-12 DIAGNOSIS — K5730 Diverticulosis of large intestine without perforation or abscess without bleeding: Secondary | ICD-10-CM | POA: Diagnosis not present

## 2021-12-12 LAB — HM COLONOSCOPY

## 2021-12-13 DIAGNOSIS — R87610 Atypical squamous cells of undetermined significance on cytologic smear of cervix (ASC-US): Secondary | ICD-10-CM | POA: Diagnosis not present

## 2021-12-13 DIAGNOSIS — Z124 Encounter for screening for malignant neoplasm of cervix: Secondary | ICD-10-CM | POA: Diagnosis not present

## 2021-12-13 DIAGNOSIS — Z1151 Encounter for screening for human papillomavirus (HPV): Secondary | ICD-10-CM | POA: Diagnosis not present

## 2021-12-15 LAB — RESULTS CONSOLE HPV: CHL HPV: POSITIVE

## 2021-12-15 LAB — HM PAP SMEAR

## 2021-12-16 ENCOUNTER — Encounter: Payer: Self-pay | Admitting: Adult Health

## 2021-12-29 ENCOUNTER — Encounter: Payer: Self-pay | Admitting: Adult Health

## 2021-12-29 ENCOUNTER — Ambulatory Visit: Payer: BC Managed Care – PPO | Admitting: Adult Health

## 2021-12-29 VITALS — BP 122/70 | HR 100 | Temp 98.7°F | Ht 61.0 in | Wt 216.0 lb

## 2021-12-29 DIAGNOSIS — M25561 Pain in right knee: Secondary | ICD-10-CM

## 2021-12-29 MED ORDER — NAPROXEN 500 MG PO TABS: 500.0000 mg | ORAL_TABLET | Freq: Two times a day (BID) | ORAL | 0 refills | Status: DC

## 2021-12-29 NOTE — Progress Notes (Signed)
? ?Subjective:  ? ? Patient ID: Katherine Rosario, female    DOB: 1971-02-03, 51 y.o.   MRN: [id number] ? ?HPI ?51 year old female who  has a past medical history of Allergic rhinitis, Anemia, Asthma, Fibroids, GERD (gastroesophageal reflux disease), Heart palpitations, Hyperlipidemia, Migraines, and OSA (obstructive sleep apnea) (05/06/2014). ? ?She presents to the office today for acute onset right knee pain.  Symptoms started roughly a week ago.  She denies any trauma or aggravating injury but may have twisted her knee at work.  She has pain with certain movements such as climbing stairs or standing up.  But no pain with rest or walking.  Pain is better in the morning when she gets up and then progresses throughout the day.  At home she has been using Advil liquid gels and ice with seems to help to some degree. ? ?Pain is located along the inside of her knee ? ? ?Review of Systems ?Se HPI  ? ?Past Medical History:  ?Diagnosis Date  ? Allergic rhinitis   ? Anemia   ? Heavy menstrual flow/OTC FE BID:    ? Asthma   ? Fibroids   ? GERD (gastroesophageal reflux disease)   ? Heart palpitations   ? Hyperlipidemia   ? Migraines   ? OSA (obstructive sleep apnea) 05/06/2014  ? ? ?Social History  ? ?Socioeconomic History  ? Marital status: Married  ?  Spouse name: Not on file  ? Number of children: Not on file  ? Years of education: Not on file  ? Highest education level: Some college, no degree  ?Occupational History  ? Occupation: Radiation protection practitioner  ?  Employer: [employer]  ?Tobacco Use  ? Smoking status: Never  ? Smokeless tobacco: Current  ?  Types: Snuff  ?Vaping Use  ? Vaping Use: Never used  ?Substance and Sexual Activity  ? Alcohol use: Yes  ?  Alcohol/week: 0.0 standard drinks  ?  Comment: occ  ? Drug use: No  ? Sexual activity: Never  ?Other Topics Concern  ? Not on file  ?Social History Narrative  ? 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  ?   ? Pt lives with wife, in a  one story home. Has some college education.   ?   ? She likes to hike and swim.   ? ?Social Determinants of Health  ? ?Financial Resource Strain: Medium Risk  ? Difficulty of Paying Living Expenses: Somewhat hard  ?Food Insecurity: Food Insecurity Present  ? Worried About Charity fundraiser in the Last Year: Sometimes true  ? Ran Out of Food in the Last Year: Sometimes true  ?Transportation Needs: No Transportation Needs  ? Lack of Transportation (Medical): No  ? Lack of Transportation (Non-Medical): No  ?Physical Activity: Insufficiently Active  ? Days of Exercise per Week: 2 days  ? Minutes of Exercise per Session: 30 min  ?Stress: Stress Concern Present  ? Feeling of Stress : Very much  ?Social Connections: Socially Isolated  ? Frequency of Communication with Friends and Family: Once a week  ? Frequency of Social Gatherings with Friends and Family: Once a week  ? Attends Religious Services: Never  ? Active Member of Clubs or Organizations: No  ? Attends Archivist Meetings: Not on file  ? Marital Status: Divorced  ?Intimate Partner Violence: Not on file  ? ? ?Past Surgical History:  ?Procedure Laterality Date  ? UTERINE FIBROID EMBOLIZATION    ? ? ?  Family History  ?Problem Relation Age of Onset  ? Hyperlipidemia Mother   ? Hypertension Mother   ? Colon cancer Father 39  ? Hyperlipidemia Father   ? Hypertension Father   ? Prostate cancer Father 66  ? Heart attack Neg Hx   ? Stroke Neg Hx   ? ? ?No Known Allergies ? ?Current Outpatient Medications on File Prior to Visit  ?Medication Sig Dispense Refill  ? citalopram (CELEXA) 10 MG tablet TAKE 1 TABLET BY MOUTH EVERY DAY 90 tablet 1  ? fluticasone (FLONASE) 50 MCG/ACT nasal spray Place 2 sprays into both nostrils daily. 16 g 6  ? hydrOXYzine (VISTARIL) 25 MG capsule Take 1 capsule (25 mg total) by mouth every 8 (eight) hours as needed. 30 capsule 0  ? methotrexate 2.5 MG tablet Take 8 tablets by mouth once a week.    ? omeprazole (PRILOSEC) 20 MG capsule  Take 1 capsule (20 mg total) by mouth daily. 90 capsule 3  ? rosuvastatin (CRESTOR) 5 MG tablet TAKE 1 TABLET BY MOUTH EVERYDAY AT BEDTIME 90 tablet 3  ? SUMAtriptan (IMITREX) 100 MG tablet ONSET of migraine;repeat in 2 hours if needed 27 tablet 2  ? ?No current facility-administered medications on file prior to visit.  ? ? ?BP 122/70   Pulse 100   Temp 98.7 ?F (37.1 ?C) (Oral)   Ht '5\' 1"'$  (1.549 m)   Wt 216 lb (98 kg)   SpO2 96%   BMI 40.81 kg/m?  ? ? ?   ?Objective:  ? Physical Exam ?Vitals and nursing note reviewed.  ?Constitutional:   ?   Appearance: Normal appearance.  ?Musculoskeletal:     ?   General: Tenderness present. No swelling.  ?   Right knee: No crepitus. Normal range of motion. Tenderness (along MCL) present over the MCL. No patellar tendon tenderness. No MCL laxity. Normal alignment, normal meniscus and normal patellar mobility.  ?   Instability Tests: Anterior drawer test negative. Posterior drawer test positive.  ?Skin: ?   General: Skin is warm and dry.  ?   Capillary Refill: Capillary refill takes less than 2 seconds.  ?Neurological:  ?   General: No focal deficit present.  ?   Mental Status: She is alert and oriented to person, place, and time.  ?Psychiatric:     ?   Mood and Affect: Mood normal.     ?   Behavior: Behavior normal.     ?   Thought Content: Thought content normal.     ?   Judgment: Judgment normal.  ? ?   ?Assessment & Plan:  ?1. Acute pain of right knee ?-Seems to be MCL strain.  No concern for meniscus or MCL tear.  Advised compression with Ace bandage, we will send in naproxen 500 mg twice daily and ice is much as possible.  If no improvement in the next 3 to 4 weeks then follow-up and we can do advanced imaging ? ?Dorothyann Peng, NP ? ? ? ?

## 2021-12-29 NOTE — Progress Notes (Signed)
? ?  Subjective:  ? ? Patient ID: Katherine Rosario, female    DOB: October 27, 1970, 51 y.o.   MRN: [id number] ? ?HPI ? ? ? ?Review of Systems ? ?   ?Objective:  ? Physical Exam ? ? ? ? ?   ?Assessment & Plan:  ? ? ?

## 2022-01-24 ENCOUNTER — Ambulatory Visit: Payer: BC Managed Care – PPO | Admitting: Adult Health

## 2022-01-24 ENCOUNTER — Encounter: Payer: Self-pay | Admitting: Adult Health

## 2022-01-24 VITALS — BP 110/80 | HR 79 | Temp 98.5°F | Ht 61.0 in | Wt 214.0 lb

## 2022-01-24 DIAGNOSIS — Z8669 Personal history of other diseases of the nervous system and sense organs: Secondary | ICD-10-CM | POA: Diagnosis not present

## 2022-01-24 DIAGNOSIS — R82998 Other abnormal findings in urine: Secondary | ICD-10-CM

## 2022-01-24 DIAGNOSIS — J40 Bronchitis, not specified as acute or chronic: Secondary | ICD-10-CM | POA: Diagnosis not present

## 2022-01-24 LAB — POCT URINALYSIS DIPSTICK
Bilirubin, UA: NEGATIVE
Blood, UA: NEGATIVE
Glucose, UA: NEGATIVE
Ketones, UA: NEGATIVE
Leukocytes, UA: NEGATIVE
Nitrite, UA: NEGATIVE
Protein, UA: NEGATIVE
Spec Grav, UA: 1.015 (ref 1.010–1.025)
Urobilinogen, UA: 0.2 E.U./dL
pH, UA: 7 (ref 5.0–8.0)

## 2022-01-24 MED ORDER — DOXYCYCLINE HYCLATE 100 MG PO CAPS: 100.0000 mg | ORAL_CAPSULE | Freq: Two times a day (BID) | ORAL | 0 refills | Status: DC

## 2022-01-24 MED ORDER — SUMATRIPTAN SUCCINATE 100 MG PO TABS: ORAL_TABLET | ORAL | 3 refills | Status: DC

## 2022-01-24 MED ORDER — PREDNISONE 10 MG PO TABS: ORAL_TABLET | ORAL | 0 refills | Status: DC

## 2022-01-24 NOTE — Progress Notes (Signed)
Subjective:    Patient ID: Katherine Rosario, female    DOB: 10-23-1970, 51 y.o.   MRN: [id number]  HPI 51 year old female who  has a past medical history of Allergic rhinitis, Anemia, Asthma, Fibroids, GERD (gastroesophageal reflux disease), Heart palpitations, Hyperlipidemia, Migraines, and OSA (obstructive sleep apnea) (05/06/2014).  She presents to the office today for an acute issue. Her symptoms include that of low grade fever up to 100, dizziness, fatigue, semi productive cough and left ear pain, dark urine, shortness of breath, and wheezing, and chest congestion. She has had to use her inhaler more frequently this week. She does report short-lived improvement in her breathing after using the inhaler.  She did an at home covid test on 01/23/2022 which was negative.    Review of Systems See HPI   Past Medical History:  Diagnosis Date   Allergic rhinitis    Anemia    Heavy menstrual flow/OTC FE BID:     Asthma    Fibroids    GERD (gastroesophageal reflux disease)    Heart palpitations    Hyperlipidemia    Migraines    OSA (obstructive sleep apnea) 05/06/2014    Social History   Socioeconomic History   Marital status: Married    Spouse name: Not on file   Number of children: Not on file   Years of education: Not on file   Highest education level: Some college, no degree  Occupational History   Occupation: Medical illustrator: [employer]  Tobacco Use   Smoking status: Never   Smokeless tobacco: Current    Types: Snuff  Vaping Use   Vaping Use: Never used  Substance and Sexual Activity   Alcohol use: Yes    Alcohol/week: 0.0 standard drinks    Comment: occ   Drug use: No   Sexual activity: Never  Other Topics Concern   Not on file  Social History Narrative   married to female partner [name] 12/2012, 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      Pt lives with wife, in a one story home. Has some college education.       She likes to hike and swim.     Social Determinants of Health   Financial Resource Strain: Medium Risk   Difficulty of Paying Living Expenses: Somewhat hard  Food Insecurity: Food Insecurity Present   Worried About [place] in the Last Year: Sometimes true   Ran Out of Food in the Last Year: Sometimes true  Transportation Needs: No Transportation Needs   Lack of Transportation (Medical): No   Lack of Transportation (Non-Medical): No  Physical Activity: Insufficiently Active   Days of Exercise per Week: 2 days   Minutes of Exercise per Session: 30 min  Stress: Stress Concern Present   Feeling of Stress : Very much  Social Connections: Socially Isolated   Frequency of Communication with Friends and Family: Once a week   Frequency of Social Gatherings with Friends and Family: Once a week   Attends Religious Services: Never   Marine scientist or Organizations: No   Attends Music therapist: Not on file   Marital Status: Divorced  Human resources officer Violence: Not on file    Past Surgical History:  Procedure Laterality Date   UTERINE FIBROID EMBOLIZATION      Family History  Problem Relation Age of Onset   Hyperlipidemia Mother    Hypertension Mother    Colon cancer Father 26  Hyperlipidemia Father    Hypertension Father    Prostate cancer Father 83   Heart attack Neg Hx    Stroke Neg Hx     No Known Allergies  Current Outpatient Medications on File Prior to Visit  Medication Sig Dispense Refill   citalopram (CELEXA) 10 MG tablet TAKE 1 TABLET BY MOUTH EVERY DAY 90 tablet 1   fluticasone (FLONASE) 50 MCG/ACT nasal spray Place 2 sprays into both nostrils daily. 16 g 6   hydrOXYzine (VISTARIL) 25 MG capsule Take 1 capsule (25 mg total) by mouth every 8 (eight) hours as needed. 30 capsule 0   methotrexate 2.5 MG tablet Take 8 tablets by mouth once a week.     naproxen (NAPROSYN) 500 MG tablet Take 1 tablet (500 mg total) by mouth 2 (two) times daily with a meal. 30 tablet 0    omeprazole (PRILOSEC) 20 MG capsule Take 1 capsule (20 mg total) by mouth daily. 90 capsule 3   rosuvastatin (CRESTOR) 5 MG tablet TAKE 1 TABLET BY MOUTH EVERYDAY AT BEDTIME 90 tablet 3   SUMAtriptan (IMITREX) 100 MG tablet ONSET of migraine;repeat in 2 hours if needed 27 tablet 2   No current facility-administered medications on file prior to visit.    BP 110/80   Pulse 79   Temp 98.5 F (36.9 C) (Oral)   Ht '5\' 1"'$  (1.549 m)   Wt 214 lb (97.1 kg)   SpO2 96%   BMI 40.43 kg/m       Objective:   Physical Exam Vitals and nursing note reviewed.  Constitutional:      Appearance: Normal appearance.  HENT:     Left Ear: No tenderness. A middle ear effusion is present. Tympanic membrane is not retracted or bulging.  Cardiovascular:     Rate and Rhythm: Normal rate and regular rhythm.     Pulses: Normal pulses.     Heart sounds: Normal heart sounds.  Pulmonary:     Effort: Pulmonary effort is normal.     Breath sounds: Normal breath sounds.  Musculoskeletal:        General: Normal range of motion.  Skin:    General: Skin is warm and dry.     Capillary Refill: Capillary refill takes less than 2 seconds.  Neurological:     General: No focal deficit present.     Mental Status: She is alert and oriented to person, place, and time.  Psychiatric:        Mood and Affect: Mood normal.        Behavior: Behavior normal.        Thought Content: Thought content normal.        Judgment: Judgment normal.      Assessment & Plan:  1. Bronchitis -Likely bronchitis, will prescribe prednisone taper.  Since we are going into a long holiday weekend, will prescribe doxycycline.  She was advised to hold off on this medication for the next 2 days if symptoms are not improving then she can start her antibiotic therapy. - predniSONE (DELTASONE) 10 MG tablet; 40 mg x 3 days, 20 mg x 3 days, 10 mg x 3 days  Dispense: 21 tablet; Refill: 0 - doxycycline (VIBRAMYCIN) 100 MG capsule; Take 1 capsule (100  mg total) by mouth 2 (two) times daily.  Dispense: 14 capsule; Refill: 0  2. Dark urine  - POC Urinalysis Dipstick- negative for infection - Drink more water  3. History of migraine headaches - refill  - SUMAtriptan (  IMITREX) 100 MG tablet; ONSET of migraine;repeat in 2 hours if needed  Dispense: 10 tablet; Refill: 3  Dorothyann Peng, NP

## 2022-01-31 ENCOUNTER — Ambulatory Visit: Payer: BC Managed Care – PPO | Admitting: Adult Health

## 2022-02-09 ENCOUNTER — Telehealth: Payer: BC Managed Care – PPO | Admitting: Adult Health

## 2022-02-09 VITALS — HR 104

## 2022-02-09 DIAGNOSIS — J4541 Moderate persistent asthma with (acute) exacerbation: Secondary | ICD-10-CM

## 2022-02-09 MED ORDER — ARNUITY ELLIPTA 100 MCG/ACT IN AEPB: 1.0000 | INHALATION_SPRAY | Freq: Every day | RESPIRATORY_TRACT | 3 refills | Status: DC

## 2022-02-09 MED ORDER — PREDNISONE 50 MG PO TABS
50.0000 mg | ORAL_TABLET | Freq: Every day | ORAL | 0 refills | Status: DC
Start: 2022-02-09 — End: 2022-02-21

## 2022-02-09 MED ORDER — ALBUTEROL SULFATE HFA 108 (90 BASE) MCG/ACT IN AERS
2.0000 | INHALATION_SPRAY | Freq: Four times a day (QID) | RESPIRATORY_TRACT | 0 refills | Status: DC | PRN
Start: 2022-02-09 — End: 2022-03-01

## 2022-02-09 NOTE — Progress Notes (Signed)
Virtual Visit via Video Note  I connected with Stewart Pimenta on 02/09/22 at 10:30 AM EDT by a video enabled telemedicine application and verified that I am speaking with the correct person using two identifiers.  Location patient: home Location provider:work or home office Persons participating in the virtual visit: patient, provider  I discussed the limitations of evaluation and management by telemedicine and the availability of in person appointments. The patient expressed understanding and agreed to proceed.   HPI: 51 year old female who is being evaluated today for recurrent shortness of breath  non productive coughand wheezing.  She was last seen roughly 2 weeks ago and diagnosed with suspected bronchitis.  She completed her therapy and reported that she was starting to feel better but 3 days after therapy was completed her symptoms came back.  She reports wheezing,, shortness of breath, and a constant unproductive cough.  She is also feeling fatigued.  She checked her oxygen saturation last night and was 92% on room air, this morning it was 94% on room air.   ROS: See pertinent positives and negatives per HPI.  Past Medical History:  Diagnosis Date   Allergic rhinitis    Anemia    Heavy menstrual flow/OTC FE BID:     Asthma    Fibroids    GERD (gastroesophageal reflux disease)    Heart palpitations    Hyperlipidemia    Migraines    OSA (obstructive sleep apnea) 05/06/2014    Past Surgical History:  Procedure Laterality Date   UTERINE FIBROID EMBOLIZATION      Family History  Problem Relation Age of Onset   Hyperlipidemia Mother    Hypertension Mother    Colon cancer Father 83   Hyperlipidemia Father    Hypertension Father    Prostate cancer Father 64   Heart attack Neg Hx    Stroke Neg Hx        Current Outpatient Medications:    albuterol (VENTOLIN HFA) 108 (90 Base) MCG/ACT inhaler, Inhale 2 puffs into the lungs every 6 (six) hours as needed for wheezing or  shortness of breath., Disp: 8 g, Rfl: 0   citalopram (CELEXA) 10 MG tablet, TAKE 1 TABLET BY MOUTH EVERY DAY, Disp: 90 tablet, Rfl: 1   doxycycline (VIBRAMYCIN) 100 MG capsule, Take 1 capsule (100 mg total) by mouth 2 (two) times daily., Disp: 14 capsule, Rfl: 0   fluticasone (FLONASE) 50 MCG/ACT nasal spray, Place 2 sprays into both nostrils daily., Disp: 16 g, Rfl: 6   Fluticasone Furoate (ARNUITY ELLIPTA) 100 MCG/ACT AEPB, Inhale 1 puff into the lungs daily., Disp: 90 each, Rfl: 3   hydrOXYzine (VISTARIL) 25 MG capsule, Take 1 capsule (25 mg total) by mouth every 8 (eight) hours as needed., Disp: 30 capsule, Rfl: 0   methotrexate 2.5 MG tablet, Take 8 tablets by mouth once a week., Disp: , Rfl:    naproxen (NAPROSYN) 500 MG tablet, Take 1 tablet (500 mg total) by mouth 2 (two) times daily with a meal., Disp: 30 tablet, Rfl: 0   omeprazole (PRILOSEC) 20 MG capsule, Take 1 capsule (20 mg total) by mouth daily., Disp: 90 capsule, Rfl: 3   predniSONE (DELTASONE) 50 MG tablet, Take 1 tablet (50 mg total) by mouth daily with breakfast., Disp: 5 tablet, Rfl: 0   rosuvastatin (CRESTOR) 5 MG tablet, TAKE 1 TABLET BY MOUTH EVERYDAY AT BEDTIME, Disp: 90 tablet, Rfl: 3   SUMAtriptan (IMITREX) 100 MG tablet, ONSET of migraine;repeat in 2 hours if needed, Disp:  10 tablet, Rfl: 3  EXAM:  VITALS per patient if applicable:  GENERAL: alert, oriented, appears well and in no acute distress  HEENT: atraumatic, conjunttiva clear, no obvious abnormalities on inspection of external nose and ears  NECK: normal movements of the head and neck  LUNGS: on inspection no signs of respiratory distress, breathing rate appears normal, no obvious gross SOB, gasping or wheezing  CV: no obvious cyanosis  MS: moves all visible extremities without noticeable abnormality  PSYCH/NEURO: pleasant and cooperative, no obvious depression or anxiety, speech and thought processing grossly intact  ASSESSMENT AND PLAN:  Discussed  the following assessment and plan:  Moderate persistent asthma with acute exacerbation - Plan: predniSONE (DELTASONE) 50 MG tablet, albuterol (VENTOLIN HFA) 108 (90 Base) MCG/ACT inhaler, Fluticasone Furoate (ARNUITY ELLIPTA) 100 MCG/ACT AEPB, Ambulatory referral to Pulmonology  She does not appear in respiratory distress during this virtual visit.  We will start her on prednisone 50 mg x 5 days and daily maintenance inhaler with Arnuity Ellipta.  We will also send in albuterol inhaler and refer to pulmonary for further testing.    I discussed the assessment and treatment plan with the patient. The patient was provided an opportunity to ask questions and all were answered. The patient agreed with the plan and demonstrated an understanding of the instructions.   The patient was advised to call back or seek an in-person evaluation if the symptoms worsen or if the condition fails to improve as anticipated.   Dorothyann Peng, NP

## 2022-02-16 DIAGNOSIS — Z79899 Other long term (current) drug therapy: Secondary | ICD-10-CM | POA: Diagnosis not present

## 2022-02-16 DIAGNOSIS — M0609 Rheumatoid arthritis without rheumatoid factor, multiple sites: Secondary | ICD-10-CM | POA: Diagnosis not present

## 2022-02-16 DIAGNOSIS — M1990 Unspecified osteoarthritis, unspecified site: Secondary | ICD-10-CM | POA: Diagnosis not present

## 2022-02-16 DIAGNOSIS — M79643 Pain in unspecified hand: Secondary | ICD-10-CM | POA: Diagnosis not present

## 2022-02-20 ENCOUNTER — Encounter (HOSPITAL_COMMUNITY): Payer: Self-pay

## 2022-02-20 ENCOUNTER — Emergency Department (HOSPITAL_COMMUNITY): Payer: BC Managed Care – PPO

## 2022-02-20 ENCOUNTER — Encounter: Payer: Self-pay | Admitting: Adult Health

## 2022-02-20 ENCOUNTER — Emergency Department (HOSPITAL_COMMUNITY)
Admission: EM | Admit: 2022-02-20 | Discharge: 2022-02-21 | Disposition: A | Discharge status: Home / Self Care | Payer: BC Managed Care – PPO | Attending: Emergency Medicine | Admitting: Emergency Medicine

## 2022-02-20 DIAGNOSIS — J45909 Unspecified asthma, uncomplicated: Secondary | ICD-10-CM | POA: Diagnosis not present

## 2022-02-20 DIAGNOSIS — J208 Acute bronchitis due to other specified organisms: Secondary | ICD-10-CM | POA: Insufficient documentation

## 2022-02-20 DIAGNOSIS — Z7952 Long term (current) use of systemic steroids: Secondary | ICD-10-CM | POA: Insufficient documentation

## 2022-02-20 DIAGNOSIS — B9789 Other viral agents as the cause of diseases classified elsewhere: Secondary | ICD-10-CM | POA: Diagnosis not present

## 2022-02-20 DIAGNOSIS — Z7951 Long term (current) use of inhaled steroids: Secondary | ICD-10-CM | POA: Diagnosis not present

## 2022-02-20 DIAGNOSIS — R509 Fever, unspecified: Secondary | ICD-10-CM | POA: Diagnosis not present

## 2022-02-20 DIAGNOSIS — R0602 Shortness of breath: Secondary | ICD-10-CM | POA: Diagnosis not present

## 2022-02-20 LAB — CBC WITH DIFFERENTIAL/PLATELET
Abs Immature Granulocytes: 0.03 10*3/uL (ref 0.00–0.07)
Basophils Absolute: 0.1 10*3/uL (ref 0.0–0.1)
Basophils Relative: 1 %
Eosinophils Absolute: 0.1 10*3/uL (ref 0.0–0.5)
Eosinophils Relative: 3 %
HCT: 42.4 % (ref 36.0–46.0)
Hemoglobin: 13.8 g/dL (ref 12.0–15.0)
Immature Granulocytes: 1 %
Lymphocytes Relative: 30 %
Lymphs Abs: 1.6 10*3/uL (ref 0.7–4.0)
MCH: 31.9 pg (ref 26.0–34.0)
MCHC: 32.5 g/dL (ref 30.0–36.0)
MCV: 98.1 fL (ref 80.0–100.0)
Monocytes Absolute: 0.6 10*3/uL (ref 0.1–1.0)
Monocytes Relative: 11 %
Neutro Abs: 2.8 10*3/uL (ref 1.7–7.7)
Neutrophils Relative %: 54 %
Platelets: 242 10*3/uL (ref 150–400)
RBC: 4.32 MIL/uL (ref 3.87–5.11)
RDW: 14.5 % (ref 11.5–15.5)
WBC: 5.2 10*3/uL (ref 4.0–10.5)
nRBC: 0 % (ref 0.0–0.2)

## 2022-02-20 LAB — BASIC METABOLIC PANEL
Anion gap: 6 (ref 5–15)
BUN: 12 mg/dL (ref 6–20)
CO2: 27 mmol/L (ref 22–32)
Calcium: 8.7 mg/dL — ABNORMAL LOW (ref 8.9–10.3)
Chloride: 110 mmol/L (ref 98–111)
Creatinine, Ser: 0.97 mg/dL (ref 0.44–1.00)
GFR, Estimated: >60 mL/min (ref >60)
Glucose, Bld: 103 mg/dL — ABNORMAL HIGH (ref 70–99)
Potassium: 3.8 mmol/L (ref 3.5–5.1)
Sodium: 143 mmol/L (ref 135–145)

## 2022-02-20 NOTE — ED Provider Triage Note (Signed)
Emergency Medicine Provider Triage Evaluation Note  Katherine Rosario , a 51 y.o. female  was evaluated in triage.  Pt complains of fevers, cough, and shortness of breath. States that she has had these symptoms for 1 month, was seen for same by her PCP at that time and was treated empirically for pneumonia with doxycycline.  She states she never had a chest x-ray confirming pneumonia.  States that 2 weeks later she returned to her PCP with continuing symptoms and was treated with prednisone.  She completed the course of both of these medications but continues to not have any improvement in her symptoms.  She also states that she has had a fever persistently for the past month.  States that she has been taking Tylenol and ibuprofen daily with some improvement.  Additionally, patient with history of asthma, states that she rarely uses her rescue albuterol inhaler normally, however the last month she has needed it about twice a day.  Review of Systems  Positive:  Negative:   Physical Exam  BP (!) 187/98   Pulse 96   Temp (!) 100.7 F (38.2 C) (Oral)   Resp 20   SpO2 99%  Gen:   Awake, no distress  Resp:  Normal effort  MSK:   Moves extremities without difficulty  Other:    Medical Decision Making  Medically screening exam initiated at 7:11 PM.  Appropriate orders placed.  JOVANNI ECKHART was informed that the remainder of the evaluation will be completed by another provider, this initial triage assessment does not replace that evaluation, and the importance of remaining in the ED until their evaluation is complete.     Nestor Lewandowsky 02/20/22 1913

## 2022-02-20 NOTE — ED Triage Notes (Addendum)
Pt arrived via POV, c/o worsening fever, recently treated for bronchitis. Finished steroids with little relief. States fever up to 103 today, took tylenol approx 1 hr ago

## 2022-02-21 ENCOUNTER — Encounter: Payer: Self-pay | Admitting: Adult Health

## 2022-02-21 MED ORDER — PREDNISONE 10 MG PO TABS: ORAL_TABLET | ORAL | 0 refills | Status: DC

## 2022-02-21 NOTE — ED Notes (Addendum)
Patient ambulated without assistance. Patient oxygen maintained 98% while ambulating.

## 2022-02-21 NOTE — Telephone Encounter (Signed)
FYI Spoke to pt to and she stated that she is still fatigue and tired. Wheezing and coughing but chest Xray was clear. Pt was given prednisone taper. Pt is taking Ibuprofen and fever has come down under 100.

## 2022-02-21 NOTE — Telephone Encounter (Signed)
Forms placed on providers desk.  ?

## 2022-02-21 NOTE — ED Provider Notes (Signed)
[facility] DEPT Provider Note   CSN: [id number] Arrival date & time: 02/20/22  1758     History  Chief Complaint  Patient presents with   Fever    Katherine Rosario is a 51 y.o. female.  HPI     A 51 year old female with a history of asthma who presents with ongoing cough and fever.  Patient reports that she initially had upper respiratory symptoms approximately 1 month ago.  She was seen and evaluated by her primary physician and treated with doxycycline and her cough improved.  She states that 7 to 10 days ago she had recurrence of symptoms and had a virtual visit.  At that time she was placed on 5 days of high-dose prednisone and has been using her inhaler.  She states that she developed a fever yesterday to 102.  It has peaked at 103.  Cough is nonproductive.  She has finished her steroids.  She reports ongoing shortness of breath.  She did test negative for COVID and influenza 1 month ago and had a negative COVID test yesterday.  She is concerned regarding her ongoing symptoms.  She is immunosuppressed on methotrexate.  Denies other infectious symptoms such as nausea, vomiting, urinary tract symptoms. Home Medications Prior to Admission medications   Medication Sig Start Date End Date Taking? Authorizing Provider  predniSONE (DELTASONE) 10 MG tablet Take 5 tablets by mouth daily for 3 days, then take 4 tablets by mouth daily for 3 days, then take 3 tablets by mouth daily for 3 days, then take 2 tablets by mouth daily for 3 days, then take 1 tablet by mouth daily for 3 days, then take 1/2 tablet by mouth for 2 days 02/21/22  Yes Oneka Parada, Barbette Hair, MD  albuterol (VENTOLIN HFA) 108 (90 Base) MCG/ACT inhaler Inhale 2 puffs into the lungs every 6 (six) hours as needed for wheezing or shortness of breath. 02/09/22   Nafziger, Tommi Rumps, NP  citalopram (CELEXA) 10 MG tablet TAKE 1 TABLET BY MOUTH EVERY DAY 02/11/21   Nafziger, Tommi Rumps, NP  doxycycline (VIBRAMYCIN) 100 MG  capsule Take 1 capsule (100 mg total) by mouth 2 (two) times daily. 01/24/22   Nafziger, Tommi Rumps, NP  fluticasone (FLONASE) 50 MCG/ACT nasal spray Place 2 sprays into both nostrils daily. 05/31/18   Nafziger, Tommi Rumps, NP  Fluticasone Furoate (ARNUITY ELLIPTA) 100 MCG/ACT AEPB Inhale 1 puff into the lungs daily. 02/09/22   Nafziger, Tommi Rumps, NP  hydrOXYzine (VISTARIL) 25 MG capsule Take 1 capsule (25 mg total) by mouth every 8 (eight) hours as needed. 03/17/21   Nafziger, Tommi Rumps, NP  methotrexate 2.5 MG tablet Take 8 tablets by mouth once a week.    [provider]  naproxen (NAPROSYN) 500 MG tablet Take 1 tablet (500 mg total) by mouth 2 (two) times daily with a meal. 12/29/21   Nafziger, Tommi Rumps, NP  omeprazole (PRILOSEC) 20 MG capsule Take 1 capsule (20 mg total) by mouth daily. 02/17/20   Nafziger, Tommi Rumps, NP  rosuvastatin (CRESTOR) 5 MG tablet TAKE 1 TABLET BY MOUTH EVERYDAY AT BEDTIME 02/11/21   Nafziger, Tommi Rumps, NP  SUMAtriptan (IMITREX) 100 MG tablet ONSET of migraine;repeat in 2 hours if needed 01/24/22   Dorothyann Peng, NP      Allergies    Patient has no known allergies.    Review of Systems   Review of Systems  Constitutional:  Positive for fever.  Respiratory:  Positive for cough and shortness of breath.   Cardiovascular:  Negative for  chest pain.  All other systems reviewed and are negative.   Physical Exam Updated Vital Signs BP (!) 137/95 (BP Location: Right Arm)   Pulse 100   Temp 97.7 F (36.5 C)   Resp 20   SpO2 96%  Physical Exam Vitals and nursing note reviewed.  Constitutional:      Appearance: She is well-developed. She is obese. She is not ill-appearing.  HENT:     Head: Normocephalic and atraumatic.  Eyes:     Pupils: Pupils are equal, round, and reactive to light.  Cardiovascular:     Rate and Rhythm: Normal rate and regular rhythm.     Heart sounds: Normal heart sounds.  Pulmonary:     Effort: Pulmonary effort is normal. No respiratory distress.     Breath sounds:  Wheezing present.     Comments: Fair air movement, occasional wheezing Abdominal:     General: Bowel sounds are normal.     Palpations: Abdomen is soft.     Tenderness: There is no abdominal tenderness.  Musculoskeletal:     Cervical back: Neck supple.     Right lower leg: No edema.     Left lower leg: No edema.  Skin:    General: Skin is warm and dry.  Neurological:     Mental Status: She is alert and oriented to person, place, and time.  Psychiatric:        Mood and Affect: Mood normal.     ED Results / Procedures / Treatments   Labs (all labs ordered are listed, but only abnormal results are displayed) Labs Reviewed  BASIC METABOLIC PANEL - Abnormal; Notable for the following components:      Result Value   Glucose, Bld 103 (*)    Calcium 8.7 (*)    All other components within normal limits  CBC WITH DIFFERENTIAL/PLATELET    EKG None  Radiology DG Chest 2 View  Result Date: 02/20/2022 CLINICAL DATA:  Short of breath EXAM: CHEST - 2 VIEW COMPARISON:  None Available. FINDINGS: The heart size and mediastinal contours are within normal limits. Both lungs are clear. The visualized skeletal structures are unremarkable. IMPRESSION: No active cardiopulmonary disease. Electronically Signed   By: Suzy Bouchard M.D.   On: 02/20/2022 19:34    Procedures Procedures    Medications Ordered in ED Medications - No data to display  ED Course/ Medical Decision Making/ A&P                           Medical Decision Making Risk Prescription drug management.   This patient presents to the ED for concern of fever, ongoing cough, shortness of breath, this involves an extensive number of treatment options, and is a complaint that carries with it a high risk of complications and morbidity.  I considered the following differential and admission for this acute, potentially life threatening condition.  The differential diagnosis includes pneumonia, viral etiology such as COVID or  influenza, acute on chronic bronchitis, asthma exacerbation  MDM:    This is a 51 year old female who presents with ongoing cough and shortness of breath.  Developed fevers 2 days ago.  Has within the last month been treated for pneumonia.  Also had a 5-day burst of steroids.  She is overall nontoxic-appearing.  She is satting 97 to 98% on room air.  She has no respiratory distress.  She has fair air movement with some end expiratory wheezing.  Chest x-ray shows  no evidence of pneumothorax or pneumonia.  Lab work obtained.  No significant leukocytosis.  No significant metabolic derangements.  Highly suspect she may have ongoing bronchitis from a viral illness.  She declines repeat COVID or influenza testing.  She is immunosuppressed which could suppress her ability to mount a white count; however, would hesitate to start on antibiotics given recent treatment.  I do think she may benefit from a longer taper of steroids given her ongoing cough.  She was ambulated and maintained her pulse ox greater than 98%.  Recommend that she continue supportive measures including her albuterol at home.  She will go home on a steroid taper.  If she is not improving in 2 to 3 days, she needs rechecked by her primary physician and may need repeat chest x-ray.  (Labs, imaging, consults)  Labs: I Ordered, and personally interpreted labs.  The pertinent results include: CBC, BMP  Imaging Studies ordered: I ordered imaging studies including chest x-ray I independently visualized and interpreted imaging. I agree with the radiologist interpretation  Additional history obtained from chart review.  External records from outside source obtained and reviewed including office visit  Cardiac Monitoring: The patient was maintained on a cardiac monitor.  I personally viewed and interpreted the cardiac monitored which showed an underlying rhythm of: Normal sinus rhythm  Reevaluation: After the interventions noted above, I  reevaluated the patient and found that they have :stayed the same  Social Determinants of Health: Lives independently  Disposition: Discharge  Co morbidities that complicate the patient evaluation  Past Medical History:  Diagnosis Date   Allergic rhinitis    Anemia    Heavy menstrual flow/OTC FE BID:     Asthma    Fibroids    GERD (gastroesophageal reflux disease)    Heart palpitations    Hyperlipidemia    Migraines    OSA (obstructive sleep apnea) 05/06/2014     Medicines Meds ordered this encounter  Medications   predniSONE (DELTASONE) 10 MG tablet    Sig: Take 5 tablets by mouth daily for 3 days, then take 4 tablets by mouth daily for 3 days, then take 3 tablets by mouth daily for 3 days, then take 2 tablets by mouth daily for 3 days, then take 1 tablet by mouth daily for 3 days, then take 1/2 tablet by mouth for 2 days    Dispense:  46 tablet    Refill:  0    I have reviewed the patients home medicines and have made adjustments as needed  Problem List / ED Course: Problem List Items Addressed This Visit   None Visit Diagnoses     Viral bronchitis    -  Primary                   Final Clinical Impression(s) / ED Diagnoses Final diagnoses:  Viral bronchitis    Rx / DC Orders ED Discharge Orders          Ordered    predniSONE (DELTASONE) 10 MG tablet        02/21/22 0442              Merryl Hacker, MD 02/21/22 0505

## 2022-02-21 NOTE — Discharge Instructions (Addendum)
You were seen today for ongoing shortness of breath and fever.  You likely have a viral infection causing bronchitis and ongoing asthma exacerbation.  You will be placed on a longer prednisone taper.  Continue your inhaler as needed.  Take Tylenol or ibuprofen for any fevers.  If you have persistent fevers past 2 to 3 days or increasing shortness of breath, you should be reevaluated.  Today your chest x-ray shows no evidence of pneumonia.

## 2022-02-21 NOTE — ED Notes (Signed)
I provided reinforced discharge education based off of discharge instructions. Pt acknowledged and understood my education. Pt had no further questions/concerns for provider/myself.  °

## 2022-02-21 NOTE — Telephone Encounter (Signed)
Pt advised of message below.Pt stated she haven't checked her temp. Today but she will check it and let us know via mychart the results.

## 2022-02-22 ENCOUNTER — Encounter: Payer: Self-pay | Admitting: Adult Health

## 2022-02-22 NOTE — Telephone Encounter (Signed)
Forms have been filed out. Pt notified of update and will have someone pick forms up. Forms placed in front office filing cabinet.

## 2022-02-27 ENCOUNTER — Other Ambulatory Visit: Payer: Self-pay | Admitting: Adult Health

## 2022-02-28 ENCOUNTER — Encounter: Payer: Self-pay | Admitting: Pulmonary Disease

## 2022-02-28 ENCOUNTER — Encounter: Payer: Self-pay | Admitting: *Deleted

## 2022-02-28 ENCOUNTER — Ambulatory Visit: Payer: BC Managed Care – PPO | Admitting: Pulmonary Disease

## 2022-02-28 ENCOUNTER — Other Ambulatory Visit: Payer: Self-pay | Admitting: Adult Health

## 2022-02-28 VITALS — BP 128/76 | HR 92 | Temp 98.6°F | Ht 62.0 in | Wt 222.6 lb

## 2022-02-28 DIAGNOSIS — J4541 Moderate persistent asthma with (acute) exacerbation: Secondary | ICD-10-CM

## 2022-02-28 DIAGNOSIS — M0609 Rheumatoid arthritis without rheumatoid factor, multiple sites: Secondary | ICD-10-CM | POA: Diagnosis not present

## 2022-02-28 MED ORDER — BUDESONIDE-FORMOTEROL FUMARATE 160-4.5 MCG/ACT IN AERO: 2.0000 | INHALATION_SPRAY | Freq: Two times a day (BID) | RESPIRATORY_TRACT | 5 refills | Status: DC

## 2022-03-01 ENCOUNTER — Other Ambulatory Visit: Payer: Self-pay | Admitting: Adult Health

## 2022-03-01 DIAGNOSIS — J4541 Moderate persistent asthma with (acute) exacerbation: Secondary | ICD-10-CM

## 2022-03-12 ENCOUNTER — Encounter: Payer: Self-pay | Admitting: Adult Health

## 2022-03-12 DIAGNOSIS — L255 Unspecified contact dermatitis due to plants, except food: Secondary | ICD-10-CM

## 2022-03-13 NOTE — Telephone Encounter (Signed)
See message from 08/05/21. Derm referral will be placed.

## 2022-03-16 ENCOUNTER — Institutional Professional Consult (permissible substitution): Payer: BC Managed Care – PPO | Admitting: Internal Medicine

## 2022-03-20 ENCOUNTER — Ambulatory Visit: Payer: BC Managed Care – PPO | Admitting: Pulmonary Disease

## 2022-03-20 ENCOUNTER — Encounter: Payer: Self-pay | Admitting: Pulmonary Disease

## 2022-03-20 VITALS — BP 136/78 | HR 94 | Ht 62.0 in | Wt 225.2 lb

## 2022-03-20 DIAGNOSIS — G4733 Obstructive sleep apnea (adult) (pediatric): Secondary | ICD-10-CM | POA: Diagnosis not present

## 2022-03-20 DIAGNOSIS — R0602 Shortness of breath: Secondary | ICD-10-CM | POA: Diagnosis not present

## 2022-03-20 DIAGNOSIS — J4541 Moderate persistent asthma with (acute) exacerbation: Secondary | ICD-10-CM

## 2022-03-20 NOTE — Patient Instructions (Signed)
Glad you are feeling better with regard to your breathing Continue Symbicort as prescribed Can use the Flonase and chlorpheniramine as needed Reduce Prilosec to 20 mg once daily  We will order high-res CT and PFTs for evaluation of lung to rule out rheumatoid arthritis interstitial lung disease Order home sleep study Follow-up in 6 months.

## 2022-03-20 NOTE — Progress Notes (Signed)
Katherine Rosario    [id number]    05-13-1971  Primary Care Physician:Nafziger, Tommi Rumps, NP  Referring Physician: Dorothyann Peng, NP [place],  [place] [id number]  Chief complaint: Follow-up for asthma  HPI: 51 y.o. with history of allergic rhinitis, asthma, OSA, rheumatoid arthritis Complains of prolonged episode of bronchitis, asthma exacerbation over the past 1 month.  She has been treated with doxycycline and multiple rounds of prednisone by primary care.  She is currently on a prednisone taper at 15 mg which we will continue for few more weeks.  Also been started on Arnuity. Evaluated in the ED on 6/19 with fevers of 103 with a chest x-ray that is clear  She has longstanding history of asthma without has been previously well controlled.  Not on controller medication prior to this episode History of rheumatoid arthritis which was diagnosed 1 year ago.  She follows with Dr. Kathlene November, rheumatology.  She had been on methotrexate for the past 1 year with poor control of arthritis symptoms recently started on Simponi  History of OSA, evaluated by Dr. Halford Chessman in 2016.  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  No feather pillows or comforter.  She has new carpeted floors and forced air HVAC which is brand-new. Smoking history: Never smoker Travel history: Originally from [place].  Moved to [place] in 2013 Relevant family history: No family history of lung disease  Interim history:  At last visit Arnuity was changed to Symbicort.  She is doing much better with this with improvement in cough and dyspnea  Had also been using Flonase and chlorpheniramine over-the-counter for postnasal drip and Pepcid twice daily temporarily for better control of acid reflux.  Outpatient Encounter Medications as of 03/20/2022  Medication Sig    albuterol (VENTOLIN HFA) 108 (90 Base) MCG/ACT inhaler INHALE 2 PUFFS INTO THE LUNGS EVERY 6 HOURS AS NEEDED FOR WHEEZING OR SHORTNESS OF BREATH   budesonide-formoterol (SYMBICORT) 160-4.5 MCG/ACT inhaler Inhale 2 puffs into the lungs in the morning and at bedtime.   fluticasone (FLONASE) 50 MCG/ACT nasal spray Place 2 sprays into both nostrils daily.   folic acid (FOLVITE) 1 MG tablet Take 1 tablet by mouth daily.   methotrexate 2.5 MG tablet Take 8 tablets by mouth once a week.   naproxen (NAPROSYN) 500 MG tablet Take 1 tablet (500 mg total) by mouth 2 (two) times daily with a meal.   omeprazole (PRILOSEC) 20 MG capsule Take 1 capsule (20 mg total) by mouth daily.   rosuvastatin (CRESTOR) 5 MG tablet Take 1 tablet (5 mg total) by mouth at bedtime. SCHEDULE APPT FOR REFILLS   SUMAtriptan (IMITREX) 100 MG tablet ONSET of migraine;repeat in 2 hours if needed   [DISCONTINUED] Fluticasone Furoate (ARNUITY ELLIPTA) 100 MCG/ACT AEPB Inhale 1 puff into the lungs daily.   [DISCONTINUED] predniSONE (DELTASONE) 10 MG tablet Take 5 tablets by mouth daily for 3 days, then take 4 tablets by mouth daily for 3 days, then take 3 tablets by mouth daily for 3 days, then take 2 tablets by mouth daily for 3 days, then take 1 tablet by mouth daily for 3 days, then take 1/2 tablet by mouth for 2 days   No facility-administered encounter medications on file as of 03/20/2022.    Physical Exam: Blood pressure 136/78, pulse 94,  height '5\' 2"'$  (1.575 m), weight 225 lb 3.2 oz (102.2 kg), SpO2 96 %. Gen:      No acute distress HEENT:  EOMI, sclera anicteric Neck:     No masses; no thyromegaly Lungs:    Clear to auscultation bilaterally; normal respiratory effort CV:         Regular rate and rhythm; no murmurs Abd:      + bowel sounds; soft, non-tender; no palpable masses, no distension Ext:    No edema; adequate peripheral perfusion Skin:      Warm and dry; no rash Neuro: alert and oriented x 3 Psych: normal mood and  affect   Data Reviewed: Imaging: Chest x-ray 02/20/2022-no active cardiopulmonary disease.  I have reviewed the images personally  PFTs:  ACT score 03/20/2022 -20  Labs: CBC 02/20/2022-WBC 5.2, eos 3%, absolute eosinophil count 159  ANA, rheumatoid factor 11/10/20- Negative  Assessment:  Asthmatic bronchitis with exacerbation Appears to have prolonged exacerbation set off by viral illness.  Suspect postnasal drip and GERD playing a big role in nonresolving symptoms  Symptoms improved with change in Arnuity to Symbicort She can go back to using Prilosec once daily and use Flonase and chlorpheniramine as needed  History of rheumatoid arthritis Order high-res CT and PFTs to look for RA ILD   OSA She has history of OSA but quit using CPAP 3 years ago when the machine broke down.  Will need repeat home sleep study to resume therapy.  Plan/Recommendations: Continue Symbicort High-res CT, PFTs Home sleep study  Marshell Garfinkel MD [facility] 03/20/2022, 8:58 AM  CC: Dorothyann Peng, NP

## 2022-03-28 ENCOUNTER — Ambulatory Visit
Admission: RE | Admit: 2022-03-28 | Discharge: 2022-03-28 | Disposition: A | Discharge status: Home / Self Care | Payer: BC Managed Care – PPO | Source: Ambulatory Visit | Attending: Pulmonary Disease | Admitting: Pulmonary Disease

## 2022-03-28 DIAGNOSIS — Z8701 Personal history of pneumonia (recurrent): Secondary | ICD-10-CM | POA: Diagnosis not present

## 2022-03-28 DIAGNOSIS — R918 Other nonspecific abnormal finding of lung field: Secondary | ICD-10-CM | POA: Diagnosis not present

## 2022-03-28 DIAGNOSIS — R0602 Shortness of breath: Secondary | ICD-10-CM | POA: Diagnosis not present

## 2022-03-28 DIAGNOSIS — I700 Atherosclerosis of aorta: Secondary | ICD-10-CM | POA: Diagnosis not present

## 2022-03-29 DIAGNOSIS — M0609 Rheumatoid arthritis without rheumatoid factor, multiple sites: Secondary | ICD-10-CM | POA: Diagnosis not present

## 2022-04-04 ENCOUNTER — Telehealth: Payer: Self-pay | Admitting: Adult Health

## 2022-04-04 ENCOUNTER — Ambulatory Visit: Payer: BC Managed Care – PPO | Admitting: Adult Health

## 2022-04-04 ENCOUNTER — Other Ambulatory Visit: Payer: Self-pay | Admitting: Adult Health

## 2022-04-04 ENCOUNTER — Encounter: Payer: Self-pay | Admitting: Adult Health

## 2022-04-04 VITALS — BP 110/80 | HR 80 | Temp 98.5°F | Ht 62.0 in | Wt 222.0 lb

## 2022-04-04 DIAGNOSIS — Z0000 Encounter for general adult medical examination without abnormal findings: Secondary | ICD-10-CM

## 2022-04-04 DIAGNOSIS — Z8669 Personal history of other diseases of the nervous system and sense organs: Secondary | ICD-10-CM | POA: Diagnosis not present

## 2022-04-04 DIAGNOSIS — E782 Mixed hyperlipidemia: Secondary | ICD-10-CM

## 2022-04-04 DIAGNOSIS — R748 Abnormal levels of other serum enzymes: Secondary | ICD-10-CM

## 2022-04-04 DIAGNOSIS — J4541 Moderate persistent asthma with (acute) exacerbation: Secondary | ICD-10-CM | POA: Diagnosis not present

## 2022-04-04 DIAGNOSIS — M069 Rheumatoid arthritis, unspecified: Secondary | ICD-10-CM

## 2022-04-04 DIAGNOSIS — K760 Fatty (change of) liver, not elsewhere classified: Secondary | ICD-10-CM

## 2022-04-04 LAB — CBC WITH DIFFERENTIAL/PLATELET
Basophils Absolute: 0 10*3/uL (ref 0.0–0.1)
Basophils Relative: 0.5 % (ref 0.0–3.0)
Eosinophils Absolute: 0.2 10*3/uL (ref 0.0–0.7)
Eosinophils Relative: 2.3 % (ref 0.0–5.0)
HCT: 42.3 % (ref 36.0–46.0)
Hemoglobin: 13.9 g/dL (ref 12.0–15.0)
Lymphocytes Relative: 42.3 % (ref 12.0–46.0)
Lymphs Abs: 3.4 10*3/uL (ref 0.7–4.0)
MCHC: 32.7 g/dL (ref 30.0–36.0)
MCV: 97.3 fl (ref 78.0–100.0)
Monocytes Absolute: 0.6 10*3/uL (ref 0.1–1.0)
Monocytes Relative: 7.7 % (ref 3.0–12.0)
Neutro Abs: 3.7 10*3/uL (ref 1.4–7.7)
Neutrophils Relative %: 47.2 % (ref 43.0–77.0)
Platelets: 357 10*3/uL (ref 150.0–400.0)
RBC: 4.35 Mil/uL (ref 3.87–5.11)
RDW: 14.7 % (ref 11.5–15.5)
WBC: 7.9 10*3/uL (ref 4.0–10.5)

## 2022-04-04 LAB — COMPREHENSIVE METABOLIC PANEL
ALT: 55 U/L — ABNORMAL HIGH (ref 0–35)
AST: 38 U/L — ABNORMAL HIGH (ref 0–37)
Albumin: 4.4 g/dL (ref 3.5–5.2)
Alkaline Phosphatase: 60 U/L (ref 39–117)
BUN: 12 mg/dL (ref 6–23)
CO2: 23 mEq/L (ref 19–32)
Calcium: 9.3 mg/dL (ref 8.4–10.5)
Chloride: 106 mEq/L (ref 96–112)
Creatinine, Ser: 0.9 mg/dL (ref 0.40–1.20)
GFR: 72.97 mL/min (ref >60.00)
Glucose, Bld: 103 mg/dL — ABNORMAL HIGH (ref 70–99)
Potassium: 4.3 mEq/L (ref 3.5–5.1)
Sodium: 139 mEq/L (ref 135–145)
Total Bilirubin: 0.5 mg/dL (ref 0.2–1.2)
Total Protein: 7.1 g/dL (ref 6.0–8.3)

## 2022-04-04 LAB — LIPID PANEL
Cholesterol: 183 mg/dL (ref 0–200)
HDL: 61.1 mg/dL (ref >39.00)
LDL Cholesterol: 99 mg/dL (ref 0–99)
NonHDL: 121.45
Total CHOL/HDL Ratio: 3
Triglycerides: 110 mg/dL (ref 0.0–149.0)
VLDL: 22 mg/dL (ref 0.0–40.0)

## 2022-04-04 LAB — TSH: TSH: 2.46 u[IU]/mL (ref 0.35–5.50)

## 2022-04-04 LAB — VITAMIN B12: Vitamin B-12: 238 pg/mL (ref 211–911)

## 2022-04-04 LAB — HEMOGLOBIN A1C: Hgb A1c MFr Bld: 5.6 % (ref 4.6–6.5)

## 2022-04-04 LAB — VITAMIN D 25 HYDROXY (VIT D DEFICIENCY, FRACTURES): VITD: 55.84 ng/mL (ref 30.00–100.00)

## 2022-04-04 NOTE — Progress Notes (Signed)
Subjective:    Patient ID: Katherine Rosario, female    DOB: 1970-10-07, 51 y.o.   MRN: [id number]  HPI Patient presents for yearly preventative medicine examination. She is a pleasant 51 year old female who  has a past medical history of Allergic rhinitis, Anemia, Asthma, Fibroids, GERD (gastroesophageal reflux disease), Heart palpitations, Hyperlipidemia, Migraines, and OSA (obstructive sleep apnea) (05/06/2014).  Migraine Headaches -has Imitrex as needed.  Her migraines are infrequent  Seasonal Allergies-controlled with over-the-counter medications  Asthma -managed by pulmonary.  Currently managed with Symbicort and albuterol inhaler.  Rheumatoid Arthritis -diagnosed in 2022.  She is followed by rheumatology.  Had been on methotrexate for roughly a year with poor control of her arthritis symptoms and recently started on Simponi infusions and has not  Obesity - she has always had a hard time with weight loss. She does stay active and eats healthy. She is interested in bariatric surgery.   Hyperlipidemia - managed with crestor 5 mg daily  Lab Results  Component Value Date   CHOL 216 (H) 02/19/2020   HDL 52.50 02/19/2020   LDLCALC 147 (H) 02/19/2020   LDLDIRECT 150.8 08/25/2013   TRIG 83.0 02/19/2020   CHOLHDL 4 02/19/2020   Fatty Liver - she had a chest CT done at Pulmonary and noticed fatty liver on the scan. She reports that her mother passed from fatty liver disease and her brother is dealing with this now. She would like to do whatever she can to help with fatty liver   All immunizations and health maintenance protocols were reviewed with the patient and needed orders were placed.  Appropriate screening laboratory values were ordered for the patient including screening of hyperlipidemia, renal function and hepatic function.  Medication reconciliation,  past medical history, social history, problem list and allergies were reviewed in detail with the patient  Goals were  established with regard to weight loss, exercise, and  diet in compliance with medications Wt Readings from Last 3 Encounters:  04/04/22 222 lb (100.7 kg)  03/20/22 225 lb 3.2 oz (102.2 kg)  02/28/22 222 lb 9.6 oz (101 kg)   She is up to date on routine colon cancer screening, mammogram, and pap- done at GYN    Review of Systems  Constitutional: Negative.   HENT: Negative.    Eyes: Negative.   Respiratory: Negative.    Cardiovascular: Negative.   Gastrointestinal: Negative.   Endocrine: Negative.   Genitourinary: Negative.   Musculoskeletal: Negative.   Skin: Negative.   Allergic/Immunologic: Negative.   Neurological: Negative.   Hematological: Negative.   Psychiatric/Behavioral: Negative.     Past Medical History:  Diagnosis Date   Allergic rhinitis    Anemia    Heavy menstrual flow/OTC FE BID:     Asthma    Fibroids    GERD (gastroesophageal reflux disease)    Heart palpitations    Hyperlipidemia    Migraines    OSA (obstructive sleep apnea) 05/06/2014    Social History   Socioeconomic History   Marital status: Divorced    Spouse name: Not on file   Number of children: Not on file   Years of education: Not on file   Highest education level: Some college, no degree  Occupational History   Occupation: Medical illustrator: [employer]  Tobacco Use   Smoking status: Never    Passive exposure: Never   Smokeless tobacco: Former    Types: Snuff    Quit date: 2021  Vaping Use   Vaping Use: Never used  Substance and Sexual Activity   Alcohol use: Yes    Alcohol/week: 0.0 standard drinks of alcohol    Comment: occ   Drug use: No   Sexual activity: Never  Other Topics Concern   Not on file  Social History Narrative   married to female partner Katherine Rosario 12/2012, 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      Pt lives with wife, in a one story home. Has some college education.       She likes to hike and swim.    Social Determinants of Health    Financial Resource Strain: Medium Risk (12/26/2021)   Overall Financial Resource Strain (CARDIA)    Difficulty of Paying Living Expenses: Somewhat hard  Food Insecurity: Food Insecurity Present (12/26/2021)   Hunger Vital Sign    Worried About Running Out of Food in the Last Year: Sometimes true    Ran Out of Food in the Last Year: Sometimes true  Transportation Needs: No Transportation Needs (12/26/2021)   PRAPARE - Hydrologist (Medical): No    Lack of Transportation (Non-Medical): No  Physical Activity: Insufficiently Active (12/26/2021)   Exercise Vital Sign    Days of Exercise per Week: 2 days    Minutes of Exercise per Session: 30 min  Stress: Stress Concern Present (12/26/2021)   [place]    Feeling of Stress : Very much  Social Connections: Socially Isolated (12/26/2021)   Social Connection and Isolation Panel [NHANES]    Frequency of Communication with Friends and Family: Once a week    Frequency of Social Gatherings with Friends and Family: Once a week    Attends Religious Services: Never    Marine scientist or Organizations: No    Attends Music therapist: Not on file    Marital Status: Divorced  Human resources officer Violence: Not on file    Past Surgical History:  Procedure Laterality Date   UTERINE FIBROID EMBOLIZATION      Family History  Problem Relation Age of Onset   Hyperlipidemia Mother    Hypertension Mother    Colon cancer Father 40   Hyperlipidemia Father    Hypertension Father    Prostate cancer Father 18   Heart attack Neg Hx    Stroke Neg Hx     No Known Allergies  Current Outpatient Medications on File Prior to Visit  Medication Sig Dispense Refill   albuterol (VENTOLIN HFA) 108 (90 Base) MCG/ACT inhaler INHALE 2 PUFFS INTO THE LUNGS EVERY 6 HOURS AS NEEDED FOR WHEEZING OR SHORTNESS OF BREATH 6.7 g 0   budesonide-formoterol  (SYMBICORT) 160-4.5 MCG/ACT inhaler Inhale 2 puffs into the lungs in the morning and at bedtime. 10.2 g 5   fluticasone (FLONASE) 50 MCG/ACT nasal spray Place 2 sprays into both nostrils daily. 16 g 6   folic acid (FOLVITE) 1 MG tablet Take 1 tablet by mouth daily.     golimumab (SIMPONI ARIA) 50 MG/4ML SOLN injection      methotrexate 2.5 MG tablet Take 8 tablets by mouth once a week.     naproxen (NAPROSYN) 500 MG tablet Take 1 tablet (500 mg total) by mouth 2 (two) times daily with a meal. 30 tablet 0   omeprazole (PRILOSEC) 20 MG capsule Take 1 capsule (20 mg total) by mouth daily. 90 capsule 3   rosuvastatin (CRESTOR) 5 MG tablet Take 1 tablet (5  mg total) by mouth at bedtime. SCHEDULE APPT FOR REFILLS 30 tablet 0   SUMAtriptan (IMITREX) 100 MG tablet ONSET of migraine;repeat in 2 hours if needed 10 tablet 3   No current facility-administered medications on file prior to visit.    BP 110/80   Pulse 80   Temp 98.5 F (36.9 C) (Oral)   Ht '5\' 2"'$  (1.575 m)   Wt 222 lb (100.7 kg)   SpO2 96%   BMI 40.60 kg/m       Objective:   Physical Exam Vitals and nursing note reviewed.  Constitutional:      General: She is not in acute distress.    Appearance: Normal appearance. She is well-developed. She is obese. She is not ill-appearing.  HENT:     Head: Normocephalic and atraumatic.     Right Ear: Tympanic membrane, ear canal and external ear normal. There is no impacted cerumen.     Left Ear: Tympanic membrane, ear canal and external ear normal. There is no impacted cerumen.     Nose: Nose normal. No congestion or rhinorrhea.     Mouth/Throat:     Mouth: Mucous membranes are moist.     Pharynx: Oropharynx is clear. No oropharyngeal exudate or posterior oropharyngeal erythema.  Eyes:     General:        Right eye: No discharge.        Left eye: No discharge.     Extraocular Movements: Extraocular movements intact.     Conjunctiva/sclera: Conjunctivae normal.     Pupils: Pupils are  equal, round, and reactive to light.  Neck:     Thyroid: No thyromegaly.     Vascular: No carotid bruit.     Trachea: No tracheal deviation.  Cardiovascular:     Rate and Rhythm: Normal rate and regular rhythm.     Pulses: Normal pulses.     Heart sounds: Normal heart sounds. No murmur heard.    No friction rub. No gallop.  Pulmonary:     Effort: Pulmonary effort is normal. No respiratory distress.     Breath sounds: Normal breath sounds. No stridor. No wheezing, rhonchi or rales.  Chest:     Chest wall: No tenderness.  Abdominal:     General: Abdomen is flat. Bowel sounds are normal. There is no distension.     Palpations: Abdomen is soft. There is no mass.     Tenderness: There is no abdominal tenderness. There is no right CVA tenderness, left CVA tenderness, guarding or rebound.     Hernia: No hernia is present.  Musculoskeletal:        General: No swelling, tenderness, deformity or signs of injury. Normal range of motion.     Cervical back: Normal range of motion and neck supple.     Right lower leg: No edema.     Left lower leg: No edema.  Lymphadenopathy:     Cervical: No cervical adenopathy.  Skin:    General: Skin is warm and dry.     Capillary Refill: Capillary refill takes less than 2 seconds.     Coloration: Skin is not jaundiced or pale.     Findings: No bruising, erythema, lesion or rash.  Neurological:     General: No focal deficit present.     Mental Status: She is alert and oriented to person, place, and time.     Cranial Nerves: No cranial nerve deficit.     Sensory: No sensory deficit.  Motor: No weakness.     Coordination: Coordination normal.     Gait: Gait normal.     Deep Tendon Reflexes: Reflexes normal.  Psychiatric:        Mood and Affect: Mood normal.        Behavior: Behavior normal.        Thought Content: Thought content normal.        Judgment: Judgment normal.       Assessment & Plan:  1. Routine general medical examination at a  health care facility - Follow up in one year  - Continue with lifestyle modifications  - CBC with Differential/Platelet; Future - Comprehensive metabolic panel; Future - Hemoglobin A1c; Future - Lipid panel; Future - TSH; Future - Vitamin B12; Future - VITAMIN D 25 Hydroxy (Vit-D Deficiency, Fractures); Future - VITAMIN D 25 Hydroxy (Vit-D Deficiency, Fractures) - Vitamin B12 - TSH - Lipid panel - Hemoglobin A1c - Comprehensive metabolic panel - CBC with Differential/Platelet  2. Moderate persistent asthma with acute exacerbation - Per pulmonary  - CBC with Differential/Platelet; Future - Comprehensive metabolic panel; Future - Hemoglobin A1c; Future - Lipid panel; Future - TSH; Future - Vitamin B12; Future - VITAMIN D 25 Hydroxy (Vit-D Deficiency, Fractures); Future - VITAMIN D 25 Hydroxy (Vit-D Deficiency, Fractures) - Vitamin B12 - TSH - Lipid panel - Hemoglobin A1c - Comprehensive metabolic panel - CBC with Differential/Platelet  3. History of migraine headaches - Continue with imitrex PRN  - CBC with Differential/Platelet; Future - Comprehensive metabolic panel; Future - Hemoglobin A1c; Future - Lipid panel; Future - TSH; Future - Vitamin B12; Future - VITAMIN D 25 Hydroxy (Vit-D Deficiency, Fractures); Future - VITAMIN D 25 Hydroxy (Vit-D Deficiency, Fractures) - Vitamin B12 - TSH - Lipid panel - Hemoglobin A1c - Comprehensive metabolic panel - CBC with Differential/Platelet  4. Mixed hyperlipidemia - Consider increasing statin  - CBC with Differential/Platelet; Future - Comprehensive metabolic panel; Future - Hemoglobin A1c; Future - Lipid panel; Future - TSH; Future - Vitamin B12; Future - VITAMIN D 25 Hydroxy (Vit-D Deficiency, Fractures); Future - VITAMIN D 25 Hydroxy (Vit-D Deficiency, Fractures) - Vitamin B12 - TSH - Lipid panel - Hemoglobin A1c - Comprehensive metabolic panel - CBC with Differential/Platelet  5. Rheumatoid arthritis  involving multiple sites, unspecified whether rheumatoid factor present [facility]) - Per rheumatology  - CBC with Differential/Platelet; Future - Comprehensive metabolic panel; Future - Hemoglobin A1c; Future - Lipid panel; Future - TSH; Future - Vitamin B12; Future - VITAMIN D 25 Hydroxy (Vit-D Deficiency, Fractures); Future - VITAMIN D 25 Hydroxy (Vit-D Deficiency, Fractures) - Vitamin B12 - TSH - Lipid panel - Hemoglobin A1c - Comprehensive metabolic panel - CBC with Differential/Platelet  6. Class 3 obesity ([place]) - She will contact [facility] to look into bariatric surgery  - CBC with Differential/Platelet; Future - Comprehensive metabolic panel; Future - Hemoglobin A1c; Future - Lipid panel; Future - TSH; Future - Vitamin B12; Future - VITAMIN D 25 Hydroxy (Vit-D Deficiency, Fractures); Future - VITAMIN D 25 Hydroxy (Vit-D Deficiency, Fractures) - Vitamin B12 - TSH - Lipid panel - Hemoglobin A1c - Comprehensive metabolic panel - CBC with Differential/Platelet  7. Fatty liver - Consider increasing statin  - Continue with heart healthy diet - Comprehensive metabolic panel; Future - Lipid panel; Future  Dorothyann Peng, NP

## 2022-04-04 NOTE — Telephone Encounter (Signed)
Updated patient on her labs.   AST and ALT were mildly elevated. Will recheck in a month. She is eating healthier and exercising more frequently

## 2022-04-04 NOTE — Patient Instructions (Signed)
It was great seeing you today   We will follow up with you regarding your lab work   Please let me know if you need anything   Please call [facility] at [phone].

## 2022-04-05 ENCOUNTER — Encounter: Payer: Self-pay | Admitting: Pulmonary Disease

## 2022-04-05 ENCOUNTER — Other Ambulatory Visit: Payer: Self-pay | Admitting: Adult Health

## 2022-04-06 DIAGNOSIS — L821 Other seborrheic keratosis: Secondary | ICD-10-CM | POA: Diagnosis not present

## 2022-04-06 DIAGNOSIS — L255 Unspecified contact dermatitis due to plants, except food: Secondary | ICD-10-CM | POA: Diagnosis not present

## 2022-04-06 NOTE — Telephone Encounter (Signed)
Pharmacy team, is there a cheaper generic version of symbicort for pt? Thank you!

## 2022-04-07 ENCOUNTER — Other Ambulatory Visit (HOSPITAL_COMMUNITY): Payer: Self-pay

## 2022-04-17 ENCOUNTER — Encounter: Payer: Self-pay | Admitting: Adult Health

## 2022-04-18 NOTE — Telephone Encounter (Signed)
Please advise 

## 2022-04-19 ENCOUNTER — Encounter: Payer: Self-pay | Admitting: Adult Health

## 2022-05-10 ENCOUNTER — Other Ambulatory Visit (HOSPITAL_COMMUNITY): Payer: Self-pay | Admitting: Surgery

## 2022-05-10 ENCOUNTER — Other Ambulatory Visit: Payer: Self-pay | Admitting: Surgery

## 2022-05-10 DIAGNOSIS — K760 Fatty (change of) liver, not elsewhere classified: Secondary | ICD-10-CM | POA: Diagnosis not present

## 2022-05-10 DIAGNOSIS — G4733 Obstructive sleep apnea (adult) (pediatric): Secondary | ICD-10-CM | POA: Diagnosis not present

## 2022-05-10 DIAGNOSIS — J45909 Unspecified asthma, uncomplicated: Secondary | ICD-10-CM | POA: Diagnosis not present

## 2022-05-15 ENCOUNTER — Ambulatory Visit (HOSPITAL_COMMUNITY)
Admission: RE | Admit: 2022-05-15 | Discharge: 2022-05-15 | Disposition: A | Discharge status: Home / Self Care | Payer: BC Managed Care – PPO | Source: Ambulatory Visit | Attending: Surgery | Admitting: Surgery

## 2022-05-15 DIAGNOSIS — Z01818 Encounter for other preprocedural examination: Secondary | ICD-10-CM | POA: Diagnosis not present

## 2022-05-16 ENCOUNTER — Encounter: Payer: BC Managed Care – PPO | Attending: Adult Health | Admitting: Dietician

## 2022-05-16 ENCOUNTER — Encounter: Payer: Self-pay | Admitting: Dietician

## 2022-05-16 DIAGNOSIS — E669 Obesity, unspecified: Secondary | ICD-10-CM | POA: Diagnosis not present

## 2022-05-16 NOTE — Progress Notes (Signed)
Nutrition Assessment for Bariatric Surgery Medical Nutrition Therapy Appt Start Time: 10:04   End Time: 11:00  Patient was seen on 05/16/2022 for Pre-Operative Nutrition Assessment. Letter of approval faxed to [facility] bariatric surgery program coordinator on 05/16/2022.   Referral stated Supervised Weight Loss (SWL) visits needed: 0  Pt completed visits.   Pt has cleared nutrition requirements.   Planned surgery: Sleeve Gastrectomy    NUTRITION ASSESSMENT   Anthropometrics  Start weight at [facility]: 221.3 lbs (date: 05/16/2022)  Height: 61 in BMI: 41.81 kg/m2     Clinical  Medical hx: Asthma, sleep apnea, obesity, GERD, anemia Medications: Methotrexate, omeprazole, folic acid, Symbicort, rosuvastatin, Simponi aria   Labs: glucose 103; lipid panel WNL Notable signs/symptoms: none noted Any previous deficiencies? No  Micronutrient Nutrition Focused Physical Exam: Hair: No issues observed Eyes: No issues observed Mouth: No issues observed Neck: No issues observed Nails: No issues observed Skin: No issues observed  Lifestyle & Dietary Hx  Patient lives alone. The pt performs the food shopping and the pt prepares the meals. She reports that she typically skips or misses 0 out of 21 possible meals per week. She may have 0 meals per week that are take-out or at a restaurant. Patient works as Banker. She denies binge eating and denies feeling shame and/or guilt after eating too much food.  She denies having used laxatives or vomiting to facilitate weight loss. She denies emotional eating during times of stress. She states that she knows the difference between hunger and thirst and can tell when she is full. Pt states she used to be bad about eating only one meal a day, stating now that she is on Weight Watchers, she does not skip meals.    Physical Activity: Walking the dogs   Sleep Hygiene: duration and quality: 7-8 hours; not always good  quality  Current Patient Perceived Stress Level as stated by pt on a scale of 1-10: 7      Stress Management Techniques: deep breathing (app)  Fruit servings per week: 25-30 Non starchy vegetable servings per week: 25-30 Whole Grains per week: 10   24-Hr Dietary Recall First Meal: 8 pm  eggs, fruit, smoothie (lowfat yogurt, almond milk, fruit) Snack:  Second Meal: 4 oz roast beef, potatoes and carrots Snack: fruit Third Meal: sometimes skip or fruit and vegetables Snack:  Beverages: water, Gatorade fit, coffee and tea  Alcoholic beverages per week: 0   Estimated Energy Needs Calories: 1500   NUTRITION DIAGNOSIS  Overweight/obesity ([place]-3.3) related to past poor dietary habits and physical inactivity as evidenced by patient w/ planned sleeve surgery following dietary guidelines for continued weight loss.    NUTRITION INTERVENTION  Nutrition counseling (C-1) and education (E-2) to facilitate bariatric surgery goals.  Educated pt on micronutrient deficiencies post surgery and strategies to mitigate that risk   Pre-Op Goals Reviewed with the Patient Track food and beverage intake (pen and paper, MyFitness Pal, Baritastic app, etc.) Make healthy food choices while monitoring portion sizes Consume 3 meals per day or try to eat every 3-5 hours Avoid concentrated sugars and fried foods Keep sugar & fat in the single digits per serving on food labels Practice CHEWING your food (aim for applesauce consistency) Practice not drinking 15 minutes before, during, and 30 minutes after each meal and snack Avoid all carbonated beverages (ex: soda, sparkling beverages)  Limit caffeinated beverages (ex: coffee, tea, energy drinks) Avoid all sugar-sweetened beverages (ex: regular soda, sports drinks)  Avoid  alcohol  Aim for 64-100 ounces of FLUID daily (with at least half of fluid intake being plain water)  Aim for at least 60-80 grams of PROTEIN daily Look for a liquid protein source that  contains ?15 g protein and ?5 g carbohydrate (ex: shakes, drinks, shots) Make a list of non-food related activities Physical activity is an important part of a healthy lifestyle so keep it moving! The goal is to reach 150 minutes of exercise per week, including cardiovascular and weight baring activity.  *Goals that are bolded indicate the pt would like to start working towards these  Handouts Provided Include  Bariatric Surgery handouts (Nutrition Visits, Pre-Op Goals, Protein Shakes, Vitamins & Minerals)  Learning Style & Readiness for Change Teaching method utilized: Visual & Auditory  Demonstrated degree of understanding via: Teach Back  Readiness Level: preparation Barriers to learning/adherence to lifestyle change: none identified  RD's Notes for Next Visit     MONITORING & EVALUATION Dietary intake, weekly physical activity, body weight, and pre-op goals reached at next nutrition visit.    Next Steps  Pt has completed visits. No further supervised visits required/recomended  Patient is to follow up at [facility] for Pre-Op Class >2 weeks before surgery for further nutrition education.

## 2022-05-19 ENCOUNTER — Encounter: Payer: Self-pay | Admitting: Adult Health

## 2022-05-19 NOTE — Telephone Encounter (Signed)
Pt advised to seek help at local UC. Pt verbalized understanding.

## 2022-05-23 DIAGNOSIS — R6889 Other general symptoms and signs: Secondary | ICD-10-CM | POA: Diagnosis not present

## 2022-05-23 DIAGNOSIS — R55 Syncope and collapse: Secondary | ICD-10-CM | POA: Diagnosis not present

## 2022-05-23 DIAGNOSIS — D649 Anemia, unspecified: Secondary | ICD-10-CM | POA: Diagnosis not present

## 2022-05-24 DIAGNOSIS — M0609 Rheumatoid arthritis without rheumatoid factor, multiple sites: Secondary | ICD-10-CM | POA: Diagnosis not present

## 2022-05-26 ENCOUNTER — Ambulatory Visit: Payer: BC Managed Care – PPO

## 2022-05-26 DIAGNOSIS — G4733 Obstructive sleep apnea (adult) (pediatric): Secondary | ICD-10-CM

## 2022-05-30 ENCOUNTER — Telehealth: Payer: Self-pay | Admitting: Pulmonary Disease

## 2022-05-30 DIAGNOSIS — G4733 Obstructive sleep apnea (adult) (pediatric): Secondary | ICD-10-CM | POA: Diagnosis not present

## 2022-05-30 DIAGNOSIS — F5089 Other specified eating disorder: Secondary | ICD-10-CM | POA: Diagnosis not present

## 2022-05-30 NOTE — Telephone Encounter (Signed)
Call patient  Sleep study result  Date of study: 05/28/2022  Impression: Severe obstructive sleep apnea Moderate oxygen desaturations  Recommendation: DME referral  Recommend CPAP therapy for severe obstructive sleep apnea  Auto titrating CPAP with pressure settings of 5-15 will be appropriate  Encourage weight loss measures  Follow-up in the office 4 to 6 weeks following initiation of treatment

## 2022-05-31 DIAGNOSIS — F5089 Other specified eating disorder: Secondary | ICD-10-CM | POA: Diagnosis not present

## 2022-06-01 NOTE — Telephone Encounter (Signed)
I called and was not able to leave a message for her to call back for results.

## 2022-06-06 NOTE — Telephone Encounter (Signed)
I have been unable to reach the patient with her results and she will need a follow up office visit to go over her sleep study. Please call the patient.

## 2022-06-15 NOTE — Progress Notes (Signed)
$'@Patient'R$  ID: Katherine Rosario, female    DOB: 1970/09/30, 51 y.o.   MRN: [id number]  Chief Complaint  Patient presents with   Follow-up    Sleep study results     Referring provider: Dorothyann Peng, NP  HPI: 51 year old female, never smoker followed for asthma and OSA. She is a patient of Dr. Matilde Bash and last seen in office 03/20/2022. Past medical history significant for migraine, allergic rhinitis, GERD, HLD.  TEST/EVENTS:  07/06/2014 HST: AHI 11.9, SpO2 low 65%, average 94% 03/28/2022 HRCT chest: no evidence of fibrotic interstitial lung disease. Mild, lobular air trapping. There are occasional tiny nodules in the dependent LLL measuring 0.2 cm.  05/28/2022 HST: AHI 46.5/hr; SpO2 low 67%, average 93%  03/20/2022: OV with Dr. Vaughan Browner. Changed from [place] to Symbicort in June 2023. Doing much better on this with improvement of cough and dyspnea. Use flonase, chlortab and pepcid. HRCT to rule out RA ILD. Hx of OSA but quit using CPAP 3 years ago - HST ordered for further evaluation.   06/16/2022: Today - follow up Patient presents today for follow up to discuss her home sleep study results, which revealed severe OSA with AHI 46.5/h. She was on CPAP years ago, around 6-7 years ago, but her machine broke and she got it fixed once but then it broke again so she stopped using it. She struggles with excessive fatigue. She wakes up feeling poorly rested and wants to go back to bed. She snores very loudly. She has woken up gasping for air before. She does struggle with drowsy driving, especially when she's going into work. She has fallen asleep driving before but it's been years ago. She denies any sleep parasomnias/paralysis, history of narcolepsy or cataplexy.  She works third shirt from 10pm-8 am. She goes to bed around 11 am-noon. Wakes a few times to use the restroom. Officially gets out of the bed around 7-8pm. Doesn't have trouble falling asleep. She doesn't take any sleep aids. Her breathing is  stable. Feels like the symbicort works well for her. She rarely has to use her albuterol. Ran a 5K last week and used it. No increased cough, chest congestion or wheezing. She is getting ready for gastric sleeve surgery; tentatively set for November 2023.   No Known Allergies  Immunization History  Administered Date(s) Administered   DTaP 05/01/2009   Influenza Split 06/04/2013   Influenza,inj,Quad PF,6+ Mos 06/04/2014, 07/01/2020   Influenza-Unspecified 07/07/1999, 06/15/2021, 07/08/2021   PFIZER(Purple Top)SARS-COV-2 Vaccination 11/19/2019, 12/12/2019   Td 09/04/2004, 02/17/2020   Tdap 05/01/2009   Unspecified SARS-COV-2 Vaccination 11/19/2019, 12/12/2019, 06/10/2020, 07/08/2021    Past Medical History:  Diagnosis Date   Allergic rhinitis    Anemia    Heavy menstrual flow/OTC FE BID:     Asthma    Fibroids    GERD (gastroesophageal reflux disease)    Heart palpitations    Hyperlipidemia    Migraines    OSA (obstructive sleep apnea) 05/06/2014    Tobacco History: Social History   Tobacco Use  Smoking Status Never   Passive exposure: Never  Smokeless Tobacco Former   Types: Snuff   Quit date: 2021   Counseling given: Not Answered   Outpatient Medications Prior to Visit  Medication Sig Dispense Refill   albuterol (VENTOLIN HFA) 108 (90 Base) MCG/ACT inhaler INHALE 2 PUFFS INTO THE LUNGS EVERY 6 HOURS AS NEEDED FOR WHEEZING OR SHORTNESS OF BREATH 6.7 g 0   budesonide-formoterol (SYMBICORT) 160-4.5 MCG/ACT inhaler Inhale 2  puffs into the lungs in the morning and at bedtime. 10.2 g 5   fluticasone (FLONASE) 50 MCG/ACT nasal spray Place 2 sprays into both nostrils daily. 16 g 6   folic acid (FOLVITE) 1 MG tablet Take 1 tablet by mouth daily.     golimumab (SIMPONI ARIA) 50 MG/4ML SOLN injection      methotrexate 2.5 MG tablet Take 8 tablets by mouth once a week.     naproxen (NAPROSYN) 500 MG tablet Take 1 tablet (500 mg total) by mouth 2 (two) times daily with a meal. 30  tablet 0   omeprazole (PRILOSEC) 20 MG capsule Take 1 capsule (20 mg total) by mouth daily. 90 capsule 3   rosuvastatin (CRESTOR) 5 MG tablet TAKE 1 TABLET(5 MG) BY MOUTH AT BEDTIME 90 tablet 3   SUMAtriptan (IMITREX) 100 MG tablet ONSET of migraine;repeat in 2 hours if needed 10 tablet 3   No facility-administered medications prior to visit.     Review of Systems:   Constitutional: No weight loss or gain, night sweats, fevers, chills, or lassitude. +excessive fatigue HEENT: No headaches, difficulty swallowing, tooth/dental problems, or sore throat. No sneezing, itching, ear ache, nasal congestion, or post nasal drip CV:  No chest pain, orthopnea, PND, swelling in lower extremities, anasarca, dizziness, palpitations, syncope Resp: +loud snoring. No increased shortness of breath with exertion or at rest. No excess mucus or change in color of mucus. No productive or non-productive. No hemoptysis. No wheezing.  No chest wall deformity MSK:  No joint pain or swelling.  No decreased range of motion.  No back pain. Neuro: No dizziness or lightheadedness.  Psych: No depression or anxiety. Mood stable. +sleep disturbance    Physical Exam:  BP (!) 132/90 (BP Location: Right Arm, Cuff Size: Normal)   Pulse 92   Temp 99.7 F (37.6 C) (Oral)   Ht '5\' 1"'$  (1.549 m)   Wt 219 lb 12.8 oz (99.7 kg)   SpO2 99%   BMI 41.53 kg/m   GEN: Pleasant, interactive, well-appearing; morbidly obese; in no acute distress. HEENT:  Normocephalic and atraumatic. PERRLA. Sclera white. Nasal turbinates pink, moist and patent bilaterally. No rhinorrhea present. Oropharynx pink and moist, without exudate or edema. No lesions, ulcerations, or postnasal drip. Mallampati II NECK:  Supple w/ fair ROM. No JVD present. Normal carotid impulses w/o bruits. Thyroid symmetrical with no goiter or nodules palpated. No lymphadenopathy.   CV: RRR, no m/r/g, no peripheral edema. Pulses intact, +2 bilaterally. No cyanosis, pallor or  clubbing. PULMONARY:  Unlabored, regular breathing. Clear bilaterally A&P w/o wheezes/rales/rhonchi. No accessory muscle use.  GI: BS present and normoactive. Soft, non-tender to palpation. No organomegaly or masses detected.  MSK: No erythema, warmth or tenderness. No deformities or joint swelling noted.  Neuro: A/Ox3. No focal deficits noted.   Skin: Warm, no lesions or rashe Psych: Normal affect and behavior. Judgement and thought content appropriate.     Lab Results:  CBC    Component Value Date/Time   WBC 7.9 04/04/2022 0808   RBC 4.35 04/04/2022 0808   HGB 13.9 04/04/2022 0808   HCT 42.3 04/04/2022 0808   PLT 357.0 04/04/2022 0808   MCV 97.3 04/04/2022 0808   MCH 31.9 02/20/2022 2103   MCHC 32.7 04/04/2022 0808   RDW 14.7 04/04/2022 0808   LYMPHSABS 3.4 04/04/2022 0808   MONOABS 0.6 04/04/2022 0808   EOSABS 0.2 04/04/2022 0808   BASOSABS 0.0 04/04/2022 0808    BMET    Component Value  Date/Time   NA 139 04/04/2022 0808   K 4.3 04/04/2022 0808   CL 106 04/04/2022 0808   CO2 23 04/04/2022 0808   GLUCOSE 103 (H) 04/04/2022 0808   BUN 12 04/04/2022 0808   CREATININE 0.90 04/04/2022 0808   CALCIUM 9.3 04/04/2022 0808   GFRNONAA >60 02/20/2022 2103   GFRAA >60 11/14/2017 0127    BNP No results found for: "BNP"   Imaging:  No results found.        No data to display          No results found for: "NITRICOXIDE"      Assessment & Plan:   OSA (obstructive sleep apnea) Recent HST; severe OSA with AHI 46.5/h. Reviewed treatment options and risks of untreated OSA. She is agreeable to restarting CPAP therapy. New start order sent for CPAP 5-20 cmH2O, mask of choice and heated humidification. Cautioned to be aware of and avoid driving drowsy.   Patient Instructions  Start to use CPAP 5-20 cmH2O every night, minimum of 4-6 hours a night.  Change equipment every 30 days or as directed by DME. Wash your tubing with warm soap and water daily, hang to dry.  Wash humidifier portion weekly.  Be aware of reduced alertness and do not drive or operate heavy machinery if experiencing this or drowsiness.  Exercise encouraged, as tolerated. Avoid or decrease alcohol consumption and medications that make you more sleepy, if possible. Notify if persistent daytime sleepiness occurs even with consistent use of CPAP.  We discussed how untreated sleep apnea puts an individual at risk for cardiac arrhthymias, pulm HTN, DM, stroke and increases their risk for daytime accidents. We also briefly reviewed treatment options including weight loss, side sleeping position, oral appliance, CPAP therapy or referral to ENT for possible surgical options  Continue Symbicort 2 puffs Twice daily. Brush tongue and rinse mouth afterwards  Continue Albuterol inhaler 2 puffs every 6 hours as needed for shortness of breath or wheezing. Notify if symptoms persist despite rescue inhaler/neb use.   Follow up in 8-12 weeks with Katie Henritta Mutz,NP to review CPAP usage or sooner if needed     Asthma Compensated on current regimen. Doing well on Symbicort.   Morbid (severe) obesity due to excess calories (HCC) BMI 41. She is tentatively scheduled for gastric sleeve in November 2023. Encouraged her to continue working on healthy weight loss measures in the interim. Discussed the correlation between obesity and OSA.   Rheumatoid arthritis ([place]) She is on methotrexate and Simponi injections. Stable. HRCT without evidence of ILD. PFTs ordered but never completed; we will schedule her today.    I spent 35 minutes of dedicated to the care of this patient on the date of this encounter to include pre-visit review of records, face-to-face time with the patient discussing conditions above, post visit ordering of testing, clinical documentation with the electronic health record, making appropriate referrals as documented, and communicating necessary findings to members of the patients care  team.  Clayton Bibles, NP 06/16/2022  Pt aware and understands NP's role.

## 2022-06-16 ENCOUNTER — Encounter: Payer: Self-pay | Admitting: Nurse Practitioner

## 2022-06-16 ENCOUNTER — Ambulatory Visit: Payer: BC Managed Care – PPO | Admitting: Pulmonary Disease

## 2022-06-16 ENCOUNTER — Ambulatory Visit: Payer: BC Managed Care – PPO | Admitting: Nurse Practitioner

## 2022-06-16 ENCOUNTER — Encounter: Payer: Self-pay | Admitting: Adult Health

## 2022-06-16 VITALS — BP 132/90 | HR 92 | Temp 99.7°F | Ht 61.0 in | Wt 219.8 lb

## 2022-06-16 DIAGNOSIS — M069 Rheumatoid arthritis, unspecified: Secondary | ICD-10-CM

## 2022-06-16 DIAGNOSIS — G4733 Obstructive sleep apnea (adult) (pediatric): Secondary | ICD-10-CM | POA: Diagnosis not present

## 2022-06-16 DIAGNOSIS — J4520 Mild intermittent asthma, uncomplicated: Secondary | ICD-10-CM

## 2022-06-16 NOTE — Assessment & Plan Note (Signed)
Compensated on current regimen. Doing well on Symbicort.

## 2022-06-16 NOTE — Assessment & Plan Note (Signed)
She is on methotrexate and Simponi injections. Stable. HRCT without evidence of ILD. PFTs ordered but never completed; we will schedule her today.

## 2022-06-16 NOTE — Assessment & Plan Note (Signed)
Recent HST; severe OSA with AHI 46.5/h. Reviewed treatment options and risks of untreated OSA. She is agreeable to restarting CPAP therapy. New start order sent for CPAP 5-20 cmH2O, mask of choice and heated humidification. Cautioned to be aware of and avoid driving drowsy.   Patient Instructions  Start to use CPAP 5-20 cmH2O every night, minimum of 4-6 hours a night.  Change equipment every 30 days or as directed by DME. Wash your tubing with warm soap and water daily, hang to dry. Wash humidifier portion weekly.  Be aware of reduced alertness and do not drive or operate heavy machinery if experiencing this or drowsiness.  Exercise encouraged, as tolerated. Avoid or decrease alcohol consumption and medications that make you more sleepy, if possible. Notify if persistent daytime sleepiness occurs even with consistent use of CPAP.  We discussed how untreated sleep apnea puts an individual at risk for cardiac arrhthymias, pulm HTN, DM, stroke and increases their risk for daytime accidents. We also briefly reviewed treatment options including weight loss, side sleeping position, oral appliance, CPAP therapy or referral to ENT for possible surgical options  Continue Symbicort 2 puffs Twice daily. Brush tongue and rinse mouth afterwards  Continue Albuterol inhaler 2 puffs every 6 hours as needed for shortness of breath or wheezing. Notify if symptoms persist despite rescue inhaler/neb use.   Follow up in 8-12 weeks with Katie Rosali Augello,NP to review CPAP usage or sooner if needed

## 2022-06-16 NOTE — Patient Instructions (Addendum)
Start to use CPAP 5-20 cmH2O every night, minimum of 4-6 hours a night.  Change equipment every 30 days or as directed by DME. Wash your tubing with warm soap and water daily, hang to dry. Wash humidifier portion weekly.  Be aware of reduced alertness and do not drive or operate heavy machinery if experiencing this or drowsiness.  Exercise encouraged, as tolerated. Avoid or decrease alcohol consumption and medications that make you more sleepy, if possible. Notify if persistent daytime sleepiness occurs even with consistent use of CPAP.  We discussed how untreated sleep apnea puts an individual at risk for cardiac arrhthymias, pulm HTN, DM, stroke and increases their risk for daytime accidents. We also briefly reviewed treatment options including weight loss, side sleeping position, oral appliance, CPAP therapy or referral to ENT for possible surgical options  Continue Symbicort 2 puffs Twice daily. Brush tongue and rinse mouth afterwards  Continue Albuterol inhaler 2 puffs every 6 hours as needed for shortness of breath or wheezing. Notify if symptoms persist despite rescue inhaler/neb use.   Follow up in 8-12 weeks with Katie Brynnlee Cumpian,NP to review CPAP usage or sooner if needed

## 2022-06-16 NOTE — Assessment & Plan Note (Signed)
BMI 41. She is tentatively scheduled for gastric sleeve in November 2023. Encouraged her to continue working on healthy weight loss measures in the interim. Discussed the correlation between obesity and OSA.

## 2022-06-19 ENCOUNTER — Encounter: Payer: Self-pay | Admitting: Dietician

## 2022-06-19 ENCOUNTER — Encounter: Payer: BC Managed Care – PPO | Attending: Adult Health | Admitting: Dietician

## 2022-06-19 DIAGNOSIS — K219 Gastro-esophageal reflux disease without esophagitis: Secondary | ICD-10-CM | POA: Insufficient documentation

## 2022-06-19 DIAGNOSIS — D649 Anemia, unspecified: Secondary | ICD-10-CM | POA: Insufficient documentation

## 2022-06-19 DIAGNOSIS — E669 Obesity, unspecified: Secondary | ICD-10-CM

## 2022-06-19 DIAGNOSIS — Z713 Dietary counseling and surveillance: Secondary | ICD-10-CM | POA: Diagnosis not present

## 2022-06-19 DIAGNOSIS — Z6841 Body Mass Index (BMI) 40.0 and over, adult: Secondary | ICD-10-CM | POA: Diagnosis not present

## 2022-06-19 DIAGNOSIS — G4730 Sleep apnea, unspecified: Secondary | ICD-10-CM | POA: Diagnosis not present

## 2022-06-19 NOTE — Progress Notes (Signed)
Pre-Operative Nutrition Class:    Patient was seen on 06/19/2022 for Pre-Operative Bariatric Surgery Education at [facility].    Surgery date:  Surgery type: Sleeve  Anthropometrics  Start weight at [facility]: 221.3 lbs (date: 05/16/2022)  Height: 61 in Weight today: 220.4 lbs. BMI: 41.64 kg/m2     Clinical  Medical hx: Asthma, sleep apnea, obesity, GERD, anemia Medications: Methotrexate, omeprazole, folic acid, Symbicort, rosuvastatin, Simponi aria   Labs: glucose 103; lipid panel WNL Notable signs/symptoms: none noted Any previous deficiencies? No  Samples given per MNT protocol. Patient educated on appropriate usage: Bariatric Advantage Multivitamin Lot # [id number] Exp: 08/24  Bariatric Advantage Calcium  Lot # [id number] Exp: 07/25/2022  Protein Shake Ensure Max Choc Lot # [id number] Exp: 04 Sep 2022  Protein Shake Ensure Max Lucianne Lei Lot # [id number] Exp: 04 Aug 2022  The following the learning objectives were met by the patient during this course: Identify Pre-Op Dietary Goals and will begin 2 weeks pre-operatively Identify appropriate sources of fluids and proteins  State protein recommendations and appropriate sources pre and post-operatively Identify Post-Operative Dietary Goals and will follow for 2 weeks post-operatively Identify appropriate multivitamin and calcium sources Describe the need for physical activity post-operatively and will follow MD recommendations State when to call healthcare provider regarding medication questions or post-operative complications When having a diagnosis of diabetes understanding hypoglycemia symptoms and the inclusion of 1 complex carbohydrate per meal  Handouts given during class include: Pre-Op Bariatric Surgery Diet Handout Protein Shake Handout Post-Op Bariatric Surgery Nutrition Handout BELT Program Information Flyer Support Group Information Flyer [facility] Bariatric Supplements  Price List  Follow-Up Plan: Patient will follow-up at [facility] 2 weeks post operatively for diet advancement per MD.

## 2022-06-22 ENCOUNTER — Ambulatory Visit: Payer: Self-pay | Admitting: Surgery

## 2022-06-22 DIAGNOSIS — J45909 Unspecified asthma, uncomplicated: Secondary | ICD-10-CM | POA: Diagnosis not present

## 2022-06-22 DIAGNOSIS — K760 Fatty (change of) liver, not elsewhere classified: Secondary | ICD-10-CM | POA: Diagnosis not present

## 2022-06-22 DIAGNOSIS — G4733 Obstructive sleep apnea (adult) (pediatric): Secondary | ICD-10-CM | POA: Diagnosis not present

## 2022-06-22 NOTE — H&P (Signed)
Katherine Rosario [id number]   Referring Provider:  Self   Subjective   Chief Complaint: Return Weight Loss (tentative sx date 07/17/22 lap sleeve, upper endo)     History of Present Illness:   Returns for follow up regarding upcoming sleeve gastrectomy. Has completed the preop pathway with no barriers identified. No changes in her health. Several insightful questions to go over today.  UGI- neg, no hh Dietician- cleared  Initial visit 05/10/22: Very pleasant 51 year old woman who presents for consultation regarding surgical treatment of morbid obesity. She has been struggling with this for at least 10 years, and has done a fair amount of yoyoing with various diets and weight loss regimens.  She has been on weight watchers for the last month or so and is doing well with this from a compliance standpoint, although last week she did not do as well because she was taking care of her father who was in the hospital.  She has noted increasing difficulty with weight loss after menopause. She reports a fairly well-balanced lifestyle, she does not like a lot of meat but eats lots of fruits and vegetables, does not have any particular proclivity for processed carbohydrates or simple sugars, but does like salty snacks.  She is very active, she has 2 dogs 1 of whom is a chocolate lab that is very active and she walks a lot to take care of this 1.  She also bikes.  She was previously a Engineer, manufacturing. She underwent a CT of her chest recently to evaluate her asthma issues, and had an incidental finding of hepatic steatosis.  She has mildly elevated AST and ALT.  This was extremely concerning to her, because she watched her mother died from complications of NASH and her brother is currently on the liver transplant list for the same. Denies significant abdominal surgery, she has had uterine fibroid embolization. She is interested in the sleeve gastrectomy.  She does note she has GERD which is well controlled with  low-dose omeprazole.  Denies tobacco use, reports occasional alcohol such as a glass of wine with a special occasion dinner but nothing significant.  She works as a Engineering geologist at [employer].   Review of Systems: A complete review of systems was obtained from the patient.  I have reviewed this information and discussed as appropriate with the patient.  See HPI as well for other ROS.   Medical History: Past Medical History:  Diagnosis Date   Arthritis    Asthma, unspecified asthma severity, unspecified whether complicated, unspecified whether persistent    GERD (gastroesophageal reflux disease)    Hyperlipidemia    Sleep apnea     There is no problem list on file for this patient.   Past Surgical History:  Procedure Laterality Date   TRANSCERVICAL UTERINE FIBROID(S) ABLATION       No Known Allergies  Current Outpatient Medications on File Prior to Visit  Medication Sig Dispense Refill   albuterol 90 mcg/actuation inhaler Inhale 2 inhalations into the lungs every 6 (six) hours as needed     fluticasone propion-salmeteroL (ADVAIR DISKUS) 250-50 mcg/dose diskus inhaler Inhale into the lungs     folic acid (FOLVITE) 1 MG tablet Take 1 tablet by mouth once daily     golimumab (SIMPONI ARIA) 12.5 mg/mL Soln      methotrexate (RHEUMATREX) 2.5 MG tablet Take 8 tablets by mouth every 7 (seven) days     omeprazole (PRILOSEC) 20 MG DR capsule 1 capsule 30  minutes before morning meal Orally Once a day for 30 day(s)     rosuvastatin (CRESTOR) 5 MG tablet Take 5 mg by mouth at bedtime     SUMAtriptan (IMITREX) 100 MG tablet ONSET of migraine;repeat in 2 hours if needed     SYMBICORT 160-4.5 mcg/actuation inhaler 2 PUFF INTO THE LUNGS IN THE AM AND AT BED TIME     No current facility-administered medications on file prior to visit.    Family History  Problem Relation Age of Onset   Hyperlipidemia (Elevated cholesterol) Mother    Colon cancer Father     Hyperlipidemia (Elevated cholesterol) Father      Social History   Tobacco Use  Smoking Status Never  Smokeless Tobacco Never     Social History   Socioeconomic History   Marital status: Divorced  Tobacco Use   Smoking status: Never   Smokeless tobacco: Never  Vaping Use   Vaping Use: Never used  Substance and Sexual Activity   Alcohol use: Yes   Drug use: Never    Objective:    Vitals:   06/22/22 0840  BP: 122/82  Pulse: 98  SpO2: 99%  Weight: 100.2 kg (220 lb 12.8 oz)  Height: 154.3 cm (5' 0.75")    Body mass index is 42.06 kg/m.  Alert and well-appearing Unlabored respirations  Labs done 04/04/2022 including CMP, lipid panel, vitamin D, vitamin B12, CBC, hemoglobin A1c, TSH reviewed in the [facility] epic.  Assessment and Plan:  Diagnoses and all orders for this visit:  Morbid obesity (CMS-HCC)  Hepatic steatosis  OSA on CPAP  Moderate asthma, unspecified whether complicated, unspecified whether persistent  Hyperlipidemia, unspecified hyperlipidemia type  Rheumatoid arthritis, involving unspecified site, unspecified whether rheumatoid factor present (CMS-HCC)  Other migraine without status migrainosus, not intractable  Gastroesophageal reflux disease, unspecified whether esophagitis present     She remains a good candidate for sleeve gastrectomy.  We have previously discussed the surgery including but anatomy, technical aspects, the risks of bleeding, infection, pain, scarring, injury to intra-abdominal structures, staple line leak or abscess, chronic abdominal pain or nausea, new onset or worsened GERD, DVT/PE, pneumonia, heart attack, stroke, death, failure to reach weight loss goals and weight regain, hernia.  Discussed the typical peri-, and postoperative course.  Discussed the importance of lifelong behavioral changes to combat the chronic and relapsing disease which is obesity.  Questions welcomed and answered to her satisfaction.  Plan to proceed as  scheduled next month.   Cathy Ropp Raquel James, MD

## 2022-06-22 NOTE — H&P (View-Only) (Signed)
Katherine Rosario [id number]   Referring Provider:  Self   Subjective   Chief Complaint: Return Weight Loss (tentative sx date 07/17/22 lap sleeve, upper endo)     History of Present Illness:   Returns for follow up regarding upcoming sleeve gastrectomy. Has completed the preop pathway with no barriers identified. No changes in her health. Several insightful questions to go over today.  UGI- neg, no hh Dietician- cleared  Initial visit 05/10/22: Very pleasant 51 year old woman who presents for consultation regarding surgical treatment of morbid obesity. She has been struggling with this for at least 10 years, and has done a fair amount of yoyoing with various diets and weight loss regimens.  She has been on weight watchers for the last month or so and is doing well with this from a compliance standpoint, although last week she did not do as well because she was taking care of her father who was in the hospital.  She has noted increasing difficulty with weight loss after menopause. She reports a fairly well-balanced lifestyle, she does not like a lot of meat but eats lots of fruits and vegetables, does not have any particular proclivity for processed carbohydrates or simple sugars, but does like salty snacks.  She is very active, she has 2 dogs 1 of whom is a chocolate lab that is very active and she walks a lot to take care of this 1.  She also bikes.  She was previously a Engineer, manufacturing. She underwent a CT of her chest recently to evaluate her asthma issues, and had an incidental finding of hepatic steatosis.  She has mildly elevated AST and ALT.  This was extremely concerning to her, because she watched her mother died from complications of NASH and her brother is currently on the liver transplant list for the same. Denies significant abdominal surgery, she has had uterine fibroid embolization. She is interested in the sleeve gastrectomy.  She does note she has GERD which is well controlled with  low-dose omeprazole.  Denies tobacco use, reports occasional alcohol such as a glass of wine with a special occasion dinner but nothing significant.  She works as a Engineering geologist at [employer].   Review of Systems: A complete review of systems was obtained from the patient.  I have reviewed this information and discussed as appropriate with the patient.  See HPI as well for other ROS.   Medical History: Past Medical History:  Diagnosis Date   Arthritis    Asthma, unspecified asthma severity, unspecified whether complicated, unspecified whether persistent    GERD (gastroesophageal reflux disease)    Hyperlipidemia    Sleep apnea     There is no problem list on file for this patient.   Past Surgical History:  Procedure Laterality Date   TRANSCERVICAL UTERINE FIBROID(S) ABLATION       No Known Allergies  Current Outpatient Medications on File Prior to Visit  Medication Sig Dispense Refill   albuterol 90 mcg/actuation inhaler Inhale 2 inhalations into the lungs every 6 (six) hours as needed     fluticasone propion-salmeteroL (ADVAIR DISKUS) 250-50 mcg/dose diskus inhaler Inhale into the lungs     folic acid (FOLVITE) 1 MG tablet Take 1 tablet by mouth once daily     golimumab (SIMPONI ARIA) 12.5 mg/mL Soln      methotrexate (RHEUMATREX) 2.5 MG tablet Take 8 tablets by mouth every 7 (seven) days     omeprazole (PRILOSEC) 20 MG DR capsule 1 capsule 30  minutes before morning meal Orally Once a day for 30 day(s)     rosuvastatin (CRESTOR) 5 MG tablet Take 5 mg by mouth at bedtime     SUMAtriptan (IMITREX) 100 MG tablet ONSET of migraine;repeat in 2 hours if needed     SYMBICORT 160-4.5 mcg/actuation inhaler 2 PUFF INTO THE LUNGS IN THE AM AND AT BED TIME     No current facility-administered medications on file prior to visit.    Family History  Problem Relation Age of Onset   Hyperlipidemia (Elevated cholesterol) Mother    Colon cancer Father     Hyperlipidemia (Elevated cholesterol) Father      Social History   Tobacco Use  Smoking Status Never  Smokeless Tobacco Never     Social History   Socioeconomic History   Marital status: Divorced  Tobacco Use   Smoking status: Never   Smokeless tobacco: Never  Vaping Use   Vaping Use: Never used  Substance and Sexual Activity   Alcohol use: Yes   Drug use: Never    Objective:    Vitals:   06/22/22 0840  BP: 122/82  Pulse: 98  SpO2: 99%  Weight: 100.2 kg (220 lb 12.8 oz)  Height: 154.3 cm (5' 0.75")    Body mass index is 42.06 kg/m.  Alert and well-appearing Unlabored respirations  Labs done 04/04/2022 including CMP, lipid panel, vitamin D, vitamin B12, CBC, hemoglobin A1c, TSH reviewed in the [facility] epic.  Assessment and Plan:  Diagnoses and all orders for this visit:  Morbid obesity (CMS-HCC)  Hepatic steatosis  OSA on CPAP  Moderate asthma, unspecified whether complicated, unspecified whether persistent  Hyperlipidemia, unspecified hyperlipidemia type  Rheumatoid arthritis, involving unspecified site, unspecified whether rheumatoid factor present (CMS-HCC)  Other migraine without status migrainosus, not intractable  Gastroesophageal reflux disease, unspecified whether esophagitis present     She remains a good candidate for sleeve gastrectomy.  We have previously discussed the surgery including but anatomy, technical aspects, the risks of bleeding, infection, pain, scarring, injury to intra-abdominal structures, staple line leak or abscess, chronic abdominal pain or nausea, new onset or worsened GERD, DVT/PE, pneumonia, heart attack, stroke, death, failure to reach weight loss goals and weight regain, hernia.  Discussed the typical peri-, and postoperative course.  Discussed the importance of lifelong behavioral changes to combat the chronic and relapsing disease which is obesity.  Questions welcomed and answered to her satisfaction.  Plan to proceed as  scheduled next month.   Addison Whidbee Raquel James, MD

## 2022-07-03 DIAGNOSIS — G4733 Obstructive sleep apnea (adult) (pediatric): Secondary | ICD-10-CM | POA: Diagnosis not present

## 2022-07-04 DIAGNOSIS — M0609 Rheumatoid arthritis without rheumatoid factor, multiple sites: Secondary | ICD-10-CM | POA: Diagnosis not present

## 2022-07-04 DIAGNOSIS — Z79899 Other long term (current) drug therapy: Secondary | ICD-10-CM | POA: Diagnosis not present

## 2022-07-04 DIAGNOSIS — M79643 Pain in unspecified hand: Secondary | ICD-10-CM | POA: Diagnosis not present

## 2022-07-04 DIAGNOSIS — M1990 Unspecified osteoarthritis, unspecified site: Secondary | ICD-10-CM | POA: Diagnosis not present

## 2022-07-06 NOTE — Progress Notes (Addendum)
COVID Vaccine Completed:  Yes  Date of COVID positive in last 90 days:  No  PCP - Dorothyann Peng, NP Cardiologist - Arnoldo Hooker, MD (lst OV 2016) [place], MD  Chest x-ray - 02-20-22 Epic EKG - 02-21-22 Epic Stress Test - N/A ECHO - 2015 Epic Cardiac Cath -   N/A Pacemaker/ICD device last checked: Spinal Cord Stimulator:  N/A  Bowel Prep - N/A  Sleep Study - Yes, +sleep apnea CPAP - Yes  Fasting Blood Sugar - N/A Checks Blood Sugar _____ times a day  Blood Thinner Instructions:  N/A Aspirin Instructions: Last Dose:  Activity level:  Can go up a flight of stairs and perform activities of daily living without stopping and without symptoms of chest pain or shortness of breath.  Anesthesia review: Palpitations with a history of syncope followed by cardiology.  Patient states that no more syncopal episodes and palpitations have resolved.   Sleep apnea  AST and ALT elevated on PAT labs.  Patient denies shortness of breath, fever, cough and chest pain at PAT appointment  Patient verbalized understanding of instructions that were given to them at the PAT appointment. Patient was also instructed that they will need to review over the PAT instructions again at home before surgery.

## 2022-07-06 NOTE — Patient Instructions (Addendum)
SURGICAL WAITING ROOM VISITATION Patients having surgery or a procedure may have no more than 2 support people in the waiting area - these visitors may rotate.   Children under the age of 57 must have an adult with them who is not the patient. If the patient needs to stay at the hospital during part of their recovery, the visitor guidelines for inpatient rooms apply. Pre-op nurse will coordinate an appropriate time for 1 support person to accompany patient in pre-op.  This support person may not rotate.    Please refer to the [facility] website for the visitor guidelines for Inpatients (after your surgery is over and you are in a regular room).      Your procedure is scheduled on: 07-17-22   Report to [facility] Main Entrance    Report to admitting at 5:15 AM   Call this number if you have problems the morning of surgery [phone]   Do not eat food :After 6:00 PM.   After 6:00 PM you may have the following liquids until 4:30 AM DAY OF SURGERY  Water Non-Citrus Juices (without pulp, NO RED) Carbonated Beverages Black Coffee (NO MILK/CREAM OR CREAMERS, sugar ok)  Clear Tea (NO MILK/CREAM OR CREAMERS, sugar ok) regular and decaf                             Plain Jell-O (NO RED)                                           Fruit ices (not with fruit pulp, NO RED)                                     Popsicles (NO RED)                                                               Sports drinks like Gatorade (NO RED)                   The day of surgery:  Drink ONE (1) Pre-Surgery G2 at 4:30 AM the morning of surgery. Drink in one sitting. Do not sip.  This drink was given to you during your hospital  pre-op appointment visit. Nothing else to drink after completing the Pre-Surgery G2.          If you have questions, please contact your surgeon's office.   FOLLOW BOWEL PREP AND ANY ADDITIONAL PRE OP INSTRUCTIONS YOU RECEIVED FROM YOUR SURGEON'S OFFICE!!!     Oral  Hygiene is also important to reduce your risk of infection.                                    Remember - BRUSH YOUR TEETH THE MORNING OF SURGERY WITH YOUR REGULAR TOOTHPASTE   Do NOT smoke after Midnight   Take these medicines the morning of surgery with A SIP OF WATER:   Omeprazole  Zyrtec  Okay to use inhalers  Imitrex if needed  Bring CPAP mask and tubing day of surgery.                              You may not have any metal on your body including hair pins, jewelry, and body piercing             Do not wear make-up, lotions, powders, perfumes or deodorant  Do not wear nail polish including gel and S&S, artificial/acrylic nails, or any other type of covering on natural nails including finger and toenails. If you have artificial nails, gel coating, etc. that needs to be removed by a nail salon please have this removed prior to surgery or surgery may need to be canceled/ delayed if the surgeon/ anesthesia feels like they are unable to be safely monitored.   Do not shave  48 hours prior to surgery.        Do not bring valuables to the hospital. [place].   Contacts, dentures or bridgework may not be worn into surgery.   Bring small overnight bag day of surgery.   DO NOT [place]. PHARMACY WILL DISPENSE MEDICATIONS LISTED ON YOUR MEDICATION LIST TO YOU DURING YOUR ADMISSION [place]!                Please read over the following fact sheets you were given: IF [place] Katherine Rosario  If you received a COVID test during your pre-op visit  it is requested that you wear a mask when out in public, stay away from anyone that may not be feeling well and notify your surgeon if you develop symptoms. If you test positive for Covid or have been in contact with anyone that has tested positive in the last 10 days please notify you surgeon.  [place] -  Preparing for Surgery Before surgery, you can play an important role.  Because skin is not sterile, your skin needs to be as free of germs as possible.  You can reduce the number of germs on your skin by washing with CHG (chlorahexidine gluconate) soap before surgery.  CHG is an antiseptic cleaner which kills germs and bonds with the skin to continue killing germs even after washing. Please DO NOT use if you have an allergy to CHG or antibacterial soaps.  If your skin becomes reddened/irritated stop using the CHG and inform your nurse when you arrive at Short Stay. Do not shave (including legs and underarms) for at least 48 hours prior to the first CHG shower.  You may shave your face/neck.  Please follow these instructions carefully:  1.  Shower with CHG Soap the night before surgery and the  morning of surgery.  2.  If you choose to wash your hair, wash your hair first as usual with your normal  shampoo.  3.  After you shampoo, rinse your hair and body thoroughly to remove the shampoo.                             4.  Use CHG as you would any other liquid soap.  You can apply chg directly to the skin and wash.  Gently with a scrungie or clean washcloth.  5.  Apply the CHG Soap to your body ONLY FROM THE NECK  DOWN.   Do   not use on face/ open                           Wound or open sores. Avoid contact with eyes, ears mouth and   genitals (private parts).                       Wash face,  Genitals (private parts) with your normal soap.             6.  Wash thoroughly, paying special attention to the area where your    surgery  will be performed.  7.  Thoroughly rinse your body with warm water from the neck down.  8.  DO NOT shower/wash with your normal soap after using and rinsing off the CHG Soap.                9.  Pat yourself dry with a clean towel.            10.  Wear clean pajamas.            11.  Place clean sheets on your bed the night of your first shower and do not  sleep with  pets. Day of Surgery : Do not apply any lotions/deodorants the morning of surgery.  Please wear clean clothes to the hospital/surgery center.  FAILURE TO FOLLOW THESE INSTRUCTIONS MAY RESULT IN THE CANCELLATION OF YOUR SURGERY  PATIENT SIGNATURE_________________________________  NURSE SIGNATURE__________________________________  ________________________________________________________________________    Katherine Rosario  An incentive spirometer is a tool that can help keep your lungs clear and active. This tool measures how well you are filling your lungs with each breath. Taking long deep breaths may help reverse or decrease the chance of developing breathing (pulmonary) problems (especially infection) following: A long period of time when you are unable to move or be active. BEFORE THE PROCEDURE  If the spirometer includes an indicator to show your best effort, your nurse or respiratory therapist will set it to a desired goal. If possible, sit up straight or lean slightly forward. Try not to slouch. Hold the incentive spirometer in an upright position. INSTRUCTIONS FOR USE  Sit on the edge of your bed if possible, or sit up as far as you can in bed or on a chair. Hold the incentive spirometer in an upright position. Breathe out normally. Place the mouthpiece in your mouth and seal your lips tightly around it. Breathe in slowly and as deeply as possible, raising the piston or the ball toward the top of the column. Hold your breath for 3-5 seconds or for as long as possible. Allow the piston or ball to fall to the bottom of the column. Remove the mouthpiece from your mouth and breathe out normally. Rest for a few seconds and repeat Steps 1 through 7 at least 10 times every 1-2 hours when you are awake. Take your time and take a few normal breaths between deep breaths. The spirometer may include an indicator to show your best effort. Use the indicator as a goal to work toward during  each repetition. After each set of 10 deep breaths, practice coughing to be sure your lungs are clear. If you have an incision (the cut made at the time of surgery), support your incision when coughing by placing a pillow or rolled up towels firmly against it. Once you are able to get out of bed, walk  around indoors and cough well. You may stop using the incentive spirometer when instructed by your caregiver.  RISKS AND COMPLICATIONS Take your time so you do not get dizzy or light-headed. If you are in pain, you may need to take or ask for pain medication before doing incentive spirometry. It is harder to take a deep breath if you are having pain. AFTER USE Rest and breathe slowly and easily. It can be helpful to keep track of a log of your progress. Your caregiver can provide you with a simple table to help with this. If you are using the spirometer at home, follow these instructions: [place] IF:  You are having difficultly using the spirometer. You have trouble using the spirometer as often as instructed. Your pain medication is not giving enough relief while using the spirometer. You develop fever of 100.5 F (38.1 C) or higher. SEEK IMMEDIATE MEDICAL CARE IF:  You cough up bloody sputum that had not been present before. You develop fever of 102 F (38.9 C) or greater. You develop worsening pain at or near the incision site. MAKE SURE YOU:  Understand these instructions. Will watch your condition. Will get help right away if you are not doing well or get worse. Document Released: 01/01/2007 Document Revised: 11/13/2011 Document Reviewed: 03/04/2007 ExitCare Patient Information 2014 ExitCare, [place].   ________________________________________________________________________ WHAT IS A BLOOD TRANSFUSION? Blood Transfusion Information  A transfusion is the replacement of blood or some of its parts. Blood is made up of multiple cells which provide different functions. Red  blood cells carry oxygen and are used for blood loss replacement. White blood cells fight against infection. Platelets control bleeding. Plasma helps clot blood. Other blood products are available for specialized needs, such as hemophilia or other clotting disorders. BEFORE THE TRANSFUSION  Who gives blood for transfusions?  Healthy volunteers who are fully evaluated to make sure their blood is safe. This is blood bank blood. Transfusion therapy is the safest it has ever been in the practice of medicine. Before blood is taken from a donor, a complete history is taken to make sure that person has no history of diseases nor engages in risky social behavior (examples are intravenous drug use or sexual activity with multiple partners). The donor's travel history is screened to minimize risk of transmitting infections, such as malaria. The donated blood is tested for signs of infectious diseases, such as HIV and hepatitis. The blood is then tested to be sure it is compatible with you in order to minimize the chance of a transfusion reaction. If you or a relative donates blood, this is often done in anticipation of surgery and is not appropriate for emergency situations. It takes many days to process the donated blood. RISKS AND COMPLICATIONS Although transfusion therapy is very safe and saves many lives, the main dangers of transfusion include:  Getting an infectious disease. Developing a transfusion reaction. This is an allergic reaction to something in the blood you were given. Every precaution is taken to prevent this. The decision to have a blood transfusion has been considered carefully by your caregiver before blood is given. Blood is not given unless the benefits outweigh the risks. AFTER THE TRANSFUSION Right after receiving a blood transfusion, you will usually feel much better and more energetic. This is especially true if your red blood cells have gotten low (anemic). The transfusion raises the  level of the red blood cells which carry oxygen, and this usually causes an energy increase. The  nurse administering the transfusion will monitor you carefully for complications. HOME CARE INSTRUCTIONS  No special instructions are needed after a transfusion. You may find your energy is better. Speak with your caregiver about any limitations on activity for underlying diseases you may have. SEEK MEDICAL CARE IF:  Your condition is not improving after your transfusion. You develop redness or irritation at the intravenous (IV) site. SEEK IMMEDIATE MEDICAL CARE IF:  Any of the following symptoms occur over the next 12 hours: Shaking chills. You have a temperature by mouth above 102 F (38.9 C), not controlled by medicine. Chest, back, or muscle pain. People around you feel you are not acting correctly or are confused. Shortness of breath or difficulty breathing. Dizziness and fainting. You get a rash or develop hives. You have a decrease in urine output. Your urine turns a dark color or changes to pink, red, or brown. Any of the following symptoms occur over the next 10 days: You have a temperature by mouth above 102 F (38.9 C), not controlled by medicine. Shortness of breath. Weakness after normal activity. The white part of the eye turns yellow (jaundice). You have a decrease in the amount of urine or are urinating less often. Your urine turns a dark color or changes to pink, red, or brown. Document Released: 08/18/2000 Document Revised: 11/13/2011 Document Reviewed: 04/06/2008 [facility] Patient Information 2014 [place], [place].  _______________________________________________________________________

## 2022-07-11 ENCOUNTER — Other Ambulatory Visit: Payer: Self-pay

## 2022-07-11 ENCOUNTER — Encounter (HOSPITAL_COMMUNITY): Payer: Self-pay

## 2022-07-11 ENCOUNTER — Encounter (HOSPITAL_COMMUNITY)
Admission: RE | Admit: 2022-07-11 | Discharge: 2022-07-11 | Disposition: A | Discharge status: Home / Self Care | Payer: BC Managed Care – PPO | Source: Ambulatory Visit | Attending: Surgery | Admitting: Surgery

## 2022-07-11 DIAGNOSIS — Z01812 Encounter for preprocedural laboratory examination: Secondary | ICD-10-CM | POA: Insufficient documentation

## 2022-07-11 HISTORY — DX: Family history of other specified conditions: Z84.89

## 2022-07-11 HISTORY — DX: Rheumatoid arthritis, unspecified: M06.9

## 2022-07-11 HISTORY — DX: Pneumonia, unspecified organism: J18.9

## 2022-07-11 HISTORY — DX: Personal history of urinary calculi: Z87.442

## 2022-07-11 LAB — CBC WITH DIFFERENTIAL/PLATELET
Abs Immature Granulocytes: 0.03 10*3/uL (ref 0.00–0.07)
Basophils Absolute: 0.1 10*3/uL (ref 0.0–0.1)
Basophils Relative: 1 %
Eosinophils Absolute: 0.3 10*3/uL (ref 0.0–0.5)
Eosinophils Relative: 3 %
HCT: 42.8 % (ref 36.0–46.0)
Hemoglobin: 13.8 g/dL (ref 12.0–15.0)
Immature Granulocytes: 0 %
Lymphocytes Relative: 32 %
Lymphs Abs: 2.8 10*3/uL (ref 0.7–4.0)
MCH: 30.6 pg (ref 26.0–34.0)
MCHC: 32.2 g/dL (ref 30.0–36.0)
MCV: 94.9 fL (ref 80.0–100.0)
Monocytes Absolute: 0.8 10*3/uL (ref 0.1–1.0)
Monocytes Relative: 9 %
Neutro Abs: 4.6 10*3/uL (ref 1.7–7.7)
Neutrophils Relative %: 55 %
Platelets: 347 10*3/uL (ref 150–400)
RBC: 4.51 MIL/uL (ref 3.87–5.11)
RDW: 14 % (ref 11.5–15.5)
WBC: 8.6 10*3/uL (ref 4.0–10.5)
nRBC: 0 % (ref 0.0–0.2)

## 2022-07-11 LAB — COMPREHENSIVE METABOLIC PANEL
ALT: 88 U/L — ABNORMAL HIGH (ref 0–44)
AST: 84 U/L — ABNORMAL HIGH (ref 15–41)
Albumin: 4.3 g/dL (ref 3.5–5.0)
Alkaline Phosphatase: 65 U/L (ref 38–126)
Anion gap: 11 (ref 5–15)
BUN: 22 mg/dL — ABNORMAL HIGH (ref 6–20)
CO2: 23 mmol/L (ref 22–32)
Calcium: 9.4 mg/dL (ref 8.9–10.3)
Chloride: 105 mmol/L (ref 98–111)
Creatinine, Ser: 0.76 mg/dL (ref 0.44–1.00)
GFR, Estimated: >60 mL/min (ref >60)
Glucose, Bld: 80 mg/dL (ref 70–99)
Potassium: 4.3 mmol/L (ref 3.5–5.1)
Sodium: 139 mmol/L (ref 135–145)
Total Bilirubin: 1.3 mg/dL — ABNORMAL HIGH (ref 0.3–1.2)
Total Protein: 7.3 g/dL (ref 6.5–8.1)

## 2022-07-17 ENCOUNTER — Encounter (HOSPITAL_COMMUNITY): Payer: Self-pay | Admitting: Surgery

## 2022-07-17 ENCOUNTER — Encounter (HOSPITAL_COMMUNITY)
Admission: RE | Disposition: A | Discharge status: Home / Self Care | Payer: Self-pay | Source: Home / Self Care | Attending: Surgery

## 2022-07-17 ENCOUNTER — Inpatient Hospital Stay (HOSPITAL_COMMUNITY): Payer: BC Managed Care – PPO | Admitting: Physician Assistant

## 2022-07-17 ENCOUNTER — Other Ambulatory Visit: Payer: Self-pay

## 2022-07-17 ENCOUNTER — Inpatient Hospital Stay (HOSPITAL_COMMUNITY): Payer: BC Managed Care – PPO | Admitting: Anesthesiology

## 2022-07-17 ENCOUNTER — Inpatient Hospital Stay (HOSPITAL_COMMUNITY)
Admission: RE | Admit: 2022-07-17 | Discharge: 2022-07-18 | DRG: 621 | Disposition: A | Discharge status: Home / Self Care | Payer: BC Managed Care – PPO | Attending: Surgery | Admitting: Surgery

## 2022-07-17 DIAGNOSIS — M0600 Rheumatoid arthritis without rheumatoid factor, unspecified site: Secondary | ICD-10-CM | POA: Diagnosis present

## 2022-07-17 DIAGNOSIS — G43909 Migraine, unspecified, not intractable, without status migrainosus: Secondary | ICD-10-CM | POA: Diagnosis present

## 2022-07-17 DIAGNOSIS — E785 Hyperlipidemia, unspecified: Secondary | ICD-10-CM | POA: Diagnosis not present

## 2022-07-17 DIAGNOSIS — M1990 Unspecified osteoarthritis, unspecified site: Secondary | ICD-10-CM | POA: Diagnosis present

## 2022-07-17 DIAGNOSIS — K219 Gastro-esophageal reflux disease without esophagitis: Secondary | ICD-10-CM | POA: Diagnosis not present

## 2022-07-17 DIAGNOSIS — G4733 Obstructive sleep apnea (adult) (pediatric): Secondary | ICD-10-CM | POA: Diagnosis not present

## 2022-07-17 DIAGNOSIS — Z800 Family history of malignant neoplasm of digestive organs: Secondary | ICD-10-CM

## 2022-07-17 DIAGNOSIS — Z6839 Body mass index (BMI) 39.0-39.9, adult: Secondary | ICD-10-CM | POA: Diagnosis not present

## 2022-07-17 DIAGNOSIS — K760 Fatty (change of) liver, not elsewhere classified: Secondary | ICD-10-CM | POA: Diagnosis not present

## 2022-07-17 DIAGNOSIS — Z79899 Other long term (current) drug therapy: Secondary | ICD-10-CM

## 2022-07-17 DIAGNOSIS — J45909 Unspecified asthma, uncomplicated: Secondary | ICD-10-CM | POA: Diagnosis not present

## 2022-07-17 DIAGNOSIS — Z7951 Long term (current) use of inhaled steroids: Secondary | ICD-10-CM | POA: Diagnosis not present

## 2022-07-17 HISTORY — PX: UPPER GI ENDOSCOPY: SHX6162

## 2022-07-17 HISTORY — PX: LAPAROSCOPIC GASTRIC SLEEVE RESECTION: SHX5895

## 2022-07-17 LAB — TYPE AND SCREEN
ABO/RH(D): A POS
Antibody Screen: NEGATIVE

## 2022-07-17 LAB — ABO/RH: ABO/RH(D): A POS

## 2022-07-17 SURGERY — GASTRECTOMY, SLEEVE, LAPAROSCOPIC
Anesthesia: General

## 2022-07-17 MED ORDER — ORAL CARE MOUTH RINSE: 15.0000 mL | Freq: Once | OROMUCOSAL | Status: AC

## 2022-07-17 MED ORDER — DOCUSATE SODIUM 100 MG PO CAPS
100.0000 mg | ORAL_CAPSULE | Freq: Two times a day (BID) | ORAL | Status: DC
  Administered 2022-07-17 – 2022-07-18 (×3): 100 mg via ORAL
  Filled 2022-07-17 (×3): qty 1

## 2022-07-17 MED ORDER — 0.9 % SODIUM CHLORIDE (POUR BTL) OPTIME
TOPICAL | Status: DC | PRN
  Administered 2022-07-17: 1000 mL

## 2022-07-17 MED ORDER — CHLORHEXIDINE GLUCONATE 4 % EX LIQD: 60.0000 mL | Freq: Once | CUTANEOUS | Status: DC

## 2022-07-17 MED ORDER — CHLORHEXIDINE GLUCONATE 0.12 % MT SOLN
15.0000 mL | Freq: Once | OROMUCOSAL | Status: AC
  Administered 2022-07-17: 15 mL via OROMUCOSAL

## 2022-07-17 MED ORDER — BUPIVACAINE LIPOSOME 1.3 % IJ SUSP: 20.0000 mL | Freq: Once | INTRAMUSCULAR | Status: DC

## 2022-07-17 MED ORDER — KETAMINE HCL 10 MG/ML IJ SOLN
INTRAMUSCULAR | Status: DC | PRN
  Administered 2022-07-17: 25 mg via INTRAVENOUS

## 2022-07-17 MED ORDER — LABETALOL HCL 5 MG/ML IV SOLN: 10.0000 mg | Freq: Once | INTRAVENOUS | Status: AC

## 2022-07-17 MED ORDER — PROPOFOL 10 MG/ML IV BOLUS
INTRAVENOUS | Status: AC
  Filled 2022-07-17: qty 20

## 2022-07-17 MED ORDER — SODIUM CHLORIDE 0.9 % IV SOLN
2.0000 g | INTRAVENOUS | Status: AC
  Administered 2022-07-17: 2 g via INTRAVENOUS
  Filled 2022-07-17: qty 2

## 2022-07-17 MED ORDER — ROCURONIUM BROMIDE 10 MG/ML (PF) SYRINGE
PREFILLED_SYRINGE | INTRAVENOUS | Status: DC | PRN
  Administered 2022-07-17: 60 mg via INTRAVENOUS

## 2022-07-17 MED ORDER — ONDANSETRON HCL 4 MG/2ML IJ SOLN
INTRAMUSCULAR | Status: DC | PRN
  Administered 2022-07-17: 4 mg via INTRAVENOUS

## 2022-07-17 MED ORDER — APREPITANT 40 MG PO CAPS
40.0000 mg | ORAL_CAPSULE | ORAL | Status: AC
  Administered 2022-07-17: 40 mg via ORAL
  Filled 2022-07-17: qty 1

## 2022-07-17 MED ORDER — SUMATRIPTAN SUCCINATE 50 MG PO TABS: 100.0000 mg | ORAL_TABLET | ORAL | Status: DC | PRN

## 2022-07-17 MED ORDER — GABAPENTIN 300 MG PO CAPS
300.0000 mg | ORAL_CAPSULE | ORAL | Status: AC
  Administered 2022-07-17: 300 mg via ORAL
  Filled 2022-07-17: qty 1

## 2022-07-17 MED ORDER — ACETAMINOPHEN 500 MG PO TABS
1000.0000 mg | ORAL_TABLET | Freq: Three times a day (TID) | ORAL | Status: DC
  Administered 2022-07-17 – 2022-07-18 (×4): 1000 mg via ORAL
  Filled 2022-07-17 (×4): qty 2

## 2022-07-17 MED ORDER — ONDANSETRON HCL 4 MG/2ML IJ SOLN
4.0000 mg | INTRAMUSCULAR | Status: DC | PRN
  Administered 2022-07-17: 4 mg via INTRAVENOUS
  Filled 2022-07-17: qty 2

## 2022-07-17 MED ORDER — ONDANSETRON HCL 4 MG/2ML IJ SOLN: 4.0000 mg | Freq: Four times a day (QID) | INTRAMUSCULAR | Status: DC | PRN

## 2022-07-17 MED ORDER — ONDANSETRON HCL 4 MG/2ML IJ SOLN
INTRAMUSCULAR | Status: AC
  Filled 2022-07-17: qty 2

## 2022-07-17 MED ORDER — SCOPOLAMINE 1 MG/3DAYS TD PT72
1.0000 | MEDICATED_PATCH | TRANSDERMAL | Status: DC
  Administered 2022-07-17: 1.5 mg via TRANSDERMAL
  Filled 2022-07-17: qty 1

## 2022-07-17 MED ORDER — METOPROLOL TARTRATE 5 MG/5ML IV SOLN: 5.0000 mg | Freq: Four times a day (QID) | INTRAVENOUS | Status: DC | PRN

## 2022-07-17 MED ORDER — ENSURE MAX PROTEIN PO LIQD
2.0000 [oz_av] | ORAL | Status: DC
  Administered 2022-07-18 (×3): 2 [oz_av] via ORAL

## 2022-07-17 MED ORDER — SODIUM CHLORIDE 0.9 % IV SOLN: INTRAVENOUS | Status: DC

## 2022-07-17 MED ORDER — OXYCODONE HCL 5 MG/5ML PO SOLN: 5.0000 mg | Freq: Once | ORAL | Status: DC | PRN

## 2022-07-17 MED ORDER — SUGAMMADEX SODIUM 200 MG/2ML IV SOLN
INTRAVENOUS | Status: DC | PRN
  Administered 2022-07-17: 200 mg via INTRAVENOUS

## 2022-07-17 MED ORDER — DEXAMETHASONE SODIUM PHOSPHATE 10 MG/ML IJ SOLN
INTRAMUSCULAR | Status: DC | PRN
  Administered 2022-07-17: 5 mg via INTRAVENOUS

## 2022-07-17 MED ORDER — OXYCODONE HCL 5 MG PO TABS: 5.0000 mg | ORAL_TABLET | Freq: Once | ORAL | Status: DC | PRN

## 2022-07-17 MED ORDER — PROPOFOL 10 MG/ML IV BOLUS
INTRAVENOUS | Status: DC | PRN
  Administered 2022-07-17: 160 mg via INTRAVENOUS

## 2022-07-17 MED ORDER — GABAPENTIN 100 MG PO CAPS
200.0000 mg | ORAL_CAPSULE | Freq: Two times a day (BID) | ORAL | Status: DC
  Administered 2022-07-17 – 2022-07-18 (×3): 200 mg via ORAL
  Filled 2022-07-17 (×3): qty 2

## 2022-07-17 MED ORDER — ALBUTEROL SULFATE HFA 108 (90 BASE) MCG/ACT IN AERS: 2.0000 | INHALATION_SPRAY | Freq: Four times a day (QID) | RESPIRATORY_TRACT | Status: DC | PRN

## 2022-07-17 MED ORDER — ACETAMINOPHEN 160 MG/5ML PO SOLN
1000.0000 mg | Freq: Three times a day (TID) | ORAL | Status: DC
  Filled 2022-07-17: qty 40.6

## 2022-07-17 MED ORDER — HEPARIN SODIUM (PORCINE) 5000 UNIT/ML IJ SOLN
5000.0000 [IU] | Freq: Three times a day (TID) | INTRAMUSCULAR | Status: DC
  Administered 2022-07-17 – 2022-07-18 (×3): 5000 [IU] via SUBCUTANEOUS
  Filled 2022-07-17 (×3): qty 1

## 2022-07-17 MED ORDER — HEPARIN SODIUM (PORCINE) 5000 UNIT/ML IJ SOLN
5000.0000 [IU] | INTRAMUSCULAR | Status: AC
  Administered 2022-07-17: 5000 [IU] via SUBCUTANEOUS
  Filled 2022-07-17: qty 1

## 2022-07-17 MED ORDER — MIDAZOLAM HCL 2 MG/2ML IJ SOLN
INTRAMUSCULAR | Status: DC | PRN
  Administered 2022-07-17: 2 mg via INTRAVENOUS

## 2022-07-17 MED ORDER — PANTOPRAZOLE SODIUM 40 MG IV SOLR
40.0000 mg | Freq: Every day | INTRAVENOUS | Status: DC
  Administered 2022-07-17: 40 mg via INTRAVENOUS
  Filled 2022-07-17: qty 10

## 2022-07-17 MED ORDER — LABETALOL HCL 5 MG/ML IV SOLN
INTRAVENOUS | Status: AC
  Administered 2022-07-17: 10 mg via INTRAVENOUS
  Filled 2022-07-17: qty 4

## 2022-07-17 MED ORDER — FLUTICASONE FUROATE-VILANTEROL 200-25 MCG/ACT IN AEPB
1.0000 | INHALATION_SPRAY | Freq: Every day | RESPIRATORY_TRACT | Status: DC
  Administered 2022-07-17 – 2022-07-18 (×2): 1 via RESPIRATORY_TRACT
  Filled 2022-07-17: qty 28

## 2022-07-17 MED ORDER — OXYCODONE HCL 5 MG/5ML PO SOLN
5.0000 mg | Freq: Four times a day (QID) | ORAL | Status: DC | PRN
  Administered 2022-07-17: 5 mg via ORAL
  Filled 2022-07-17: qty 5

## 2022-07-17 MED ORDER — PHENYLEPHRINE 80 MCG/ML (10ML) SYRINGE FOR IV PUSH (FOR BLOOD PRESSURE SUPPORT)
PREFILLED_SYRINGE | INTRAVENOUS | Status: DC | PRN
  Administered 2022-07-17: 160 ug via INTRAVENOUS

## 2022-07-17 MED ORDER — LIDOCAINE 2% (20 MG/ML) 5 ML SYRINGE
INTRAMUSCULAR | Status: DC | PRN
  Administered 2022-07-17: 60 mg via INTRAVENOUS

## 2022-07-17 MED ORDER — LACTATED RINGERS IV SOLN: INTRAVENOUS | Status: DC

## 2022-07-17 MED ORDER — ACETAMINOPHEN 500 MG PO TABS
1000.0000 mg | ORAL_TABLET | ORAL | Status: AC
  Administered 2022-07-17: 1000 mg via ORAL
  Filled 2022-07-17: qty 2

## 2022-07-17 MED ORDER — LACTATED RINGERS IR SOLN
Status: DC | PRN
  Administered 2022-07-17: 1000 mL

## 2022-07-17 MED ORDER — HYDRALAZINE HCL 20 MG/ML IJ SOLN
10.0000 mg | INTRAMUSCULAR | Status: DC | PRN
  Administered 2022-07-17: 10 mg via INTRAVENOUS
  Filled 2022-07-17: qty 1

## 2022-07-17 MED ORDER — STERILE WATER FOR IRRIGATION IR SOLN
Status: DC | PRN
  Administered 2022-07-17: 1000 mL

## 2022-07-17 MED ORDER — BUPIVACAINE-EPINEPHRINE 0.5% -1:200000 IJ SOLN
INTRAMUSCULAR | Status: DC | PRN
  Administered 2022-07-17: 30 mL

## 2022-07-17 MED ORDER — KETAMINE HCL 10 MG/ML IJ SOLN
INTRAMUSCULAR | Status: AC
  Filled 2022-07-17: qty 1

## 2022-07-17 MED ORDER — ALBUTEROL SULFATE (2.5 MG/3ML) 0.083% IN NEBU: 2.5000 mg | INHALATION_SOLUTION | Freq: Four times a day (QID) | RESPIRATORY_TRACT | Status: DC | PRN

## 2022-07-17 MED ORDER — SIMETHICONE 80 MG PO CHEW: 80.0000 mg | CHEWABLE_TABLET | Freq: Four times a day (QID) | ORAL | Status: DC | PRN

## 2022-07-17 MED ORDER — FENTANYL CITRATE (PF) 250 MCG/5ML IJ SOLN
INTRAMUSCULAR | Status: AC
  Filled 2022-07-17: qty 5

## 2022-07-17 MED ORDER — MIDAZOLAM HCL 2 MG/2ML IJ SOLN
INTRAMUSCULAR | Status: AC
  Filled 2022-07-17: qty 2

## 2022-07-17 MED ORDER — BUPIVACAINE LIPOSOME 1.3 % IJ SUSP
INTRAMUSCULAR | Status: AC
  Filled 2022-07-17: qty 20

## 2022-07-17 MED ORDER — FENTANYL CITRATE (PF) 250 MCG/5ML IJ SOLN
INTRAMUSCULAR | Status: DC | PRN
  Administered 2022-07-17 (×3): 50 ug via INTRAVENOUS
  Administered 2022-07-17: 100 ug via INTRAVENOUS

## 2022-07-17 MED ORDER — HYDROMORPHONE HCL 1 MG/ML IJ SOLN: 0.5000 mg | INTRAMUSCULAR | Status: DC | PRN

## 2022-07-17 MED ORDER — FENTANYL CITRATE PF 50 MCG/ML IJ SOSY
PREFILLED_SYRINGE | INTRAMUSCULAR | Status: AC
  Administered 2022-07-17: 25 ug via INTRAVENOUS
  Filled 2022-07-17: qty 3

## 2022-07-17 MED ORDER — LIDOCAINE 20MG/ML (2%) 15 ML SYRINGE OPTIME
INTRAMUSCULAR | Status: DC | PRN
  Administered 2022-07-17: 1.5 mg/kg/h via INTRAVENOUS

## 2022-07-17 MED ORDER — PHENYLEPHRINE 80 MCG/ML (10ML) SYRINGE FOR IV PUSH (FOR BLOOD PRESSURE SUPPORT)
PREFILLED_SYRINGE | INTRAVENOUS | Status: AC
  Filled 2022-07-17: qty 10

## 2022-07-17 MED ORDER — METHOCARBAMOL 500 MG PO TABS: 500.0000 mg | ORAL_TABLET | Freq: Four times a day (QID) | ORAL | Status: DC | PRN

## 2022-07-17 MED ORDER — BUPIVACAINE LIPOSOME 1.3 % IJ SUSP
INTRAMUSCULAR | Status: DC | PRN
  Administered 2022-07-17: 20 mL

## 2022-07-17 MED ORDER — FENTANYL CITRATE PF 50 MCG/ML IJ SOSY
25.0000 ug | PREFILLED_SYRINGE | INTRAMUSCULAR | Status: DC | PRN
  Administered 2022-07-17: 25 ug via INTRAVENOUS

## 2022-07-17 MED ORDER — BUPIVACAINE-EPINEPHRINE (PF) 0.5% -1:200000 IJ SOLN
INTRAMUSCULAR | Status: AC
  Filled 2022-07-17: qty 30

## 2022-07-17 MED ORDER — LIDOCAINE HCL (PF) 2 % IJ SOLN
INTRAMUSCULAR | Status: AC
  Filled 2022-07-17: qty 5

## 2022-07-17 MED ORDER — TRAMADOL HCL 50 MG PO TABS: 50.0000 mg | ORAL_TABLET | Freq: Four times a day (QID) | ORAL | Status: DC | PRN

## 2022-07-17 MED ORDER — ROCURONIUM BROMIDE 10 MG/ML (PF) SYRINGE
PREFILLED_SYRINGE | INTRAVENOUS | Status: AC
  Filled 2022-07-17: qty 10

## 2022-07-17 MED ORDER — LIDOCAINE HCL 2 % IJ SOLN
INTRAMUSCULAR | Status: AC
  Filled 2022-07-17: qty 20

## 2022-07-17 MED ORDER — METOCLOPRAMIDE HCL 5 MG/ML IJ SOLN
10.0000 mg | Freq: Four times a day (QID) | INTRAMUSCULAR | Status: DC
  Administered 2022-07-17 – 2022-07-18 (×5): 10 mg via INTRAVENOUS
  Filled 2022-07-17 (×5): qty 2

## 2022-07-17 MED ORDER — DEXAMETHASONE SODIUM PHOSPHATE 10 MG/ML IJ SOLN
INTRAMUSCULAR | Status: AC
  Filled 2022-07-17: qty 1

## 2022-07-17 SURGICAL SUPPLY — 74 items
APPLIER CLIP ROT 10 11.4 M/L (STAPLE)
APPLIER CLIP ROT 13.4 12 LRG (CLIP) ×1
BAG COUNTER SPONGE SURGICOUNT (BAG) IMPLANT
BAG LAPAROSCOPIC 12 15 PORT 16 (BASKET) IMPLANT
BAG RETRIEVAL 12/15 (BASKET) ×1
BENZOIN TINCTURE PRP APPL 2/3 (GAUZE/BANDAGES/DRESSINGS) ×1 IMPLANT
BLADE SURG SZ11 CARB STEEL (BLADE) ×1 IMPLANT
BNDG ADH 1X3 SHEER STRL LF (GAUZE/BANDAGES/DRESSINGS) ×6 IMPLANT
CABLE HIGH FREQUENCY MONO STRZ (ELECTRODE) IMPLANT
CHLORAPREP W/TINT 26 (MISCELLANEOUS) ×2 IMPLANT
CLIP APPLIE ROT 10 11.4 M/L (STAPLE) IMPLANT
CLIP APPLIE ROT 13.4 12 LRG (CLIP) IMPLANT
COVER SURGICAL LIGHT HANDLE (MISCELLANEOUS) ×1 IMPLANT
DEVICE SUT QUICK LOAD TK 5 (SUTURE) IMPLANT
DEVICE SUT TI-KNOT TK 5X26 (SUTURE) IMPLANT
DRAPE UTILITY XL STRL (DRAPES) ×2 IMPLANT
ELECT REM PT RETURN 15FT ADLT (MISCELLANEOUS) ×1 IMPLANT
GAUZE SPONGE 4X4 12PLY STRL (GAUZE/BANDAGES/DRESSINGS) IMPLANT
GLOVE BIO SURGEON STRL SZ 6 (GLOVE) ×1 IMPLANT
GLOVE INDICATOR 6.5 STRL GRN (GLOVE) ×1 IMPLANT
GLOVE SS BIOGEL STRL SZ 6 (GLOVE) ×1 IMPLANT
GOWN STRL REUS W/ TWL LRG LVL3 (GOWN DISPOSABLE) ×1 IMPLANT
GOWN STRL REUS W/ TWL XL LVL3 (GOWN DISPOSABLE) IMPLANT
GOWN STRL REUS W/TWL LRG LVL3 (GOWN DISPOSABLE) ×1
GOWN STRL REUS W/TWL XL LVL3 (GOWN DISPOSABLE)
GRASPER SUT TROCAR 14GX15 (MISCELLANEOUS) ×1 IMPLANT
IRRIG SUCT STRYKERFLOW 2 WTIP (MISCELLANEOUS) ×1
IRRIGATION SUCT STRKRFLW 2 WTP (MISCELLANEOUS) ×1 IMPLANT
KIT BASIN OR (CUSTOM PROCEDURE TRAY) ×1 IMPLANT
KIT TURNOVER KIT A (KITS) IMPLANT
MARKER SKIN DUAL TIP RULER LAB (MISCELLANEOUS) ×1 IMPLANT
MAT PREVALON FULL STRYKER (MISCELLANEOUS) ×1 IMPLANT
NDL SPNL 22GX3.5 QUINCKE BK (NEEDLE) ×1 IMPLANT
NEEDLE SPNL 22GX3.5 QUINCKE BK (NEEDLE) ×1 IMPLANT
PACK UNIVERSAL I (CUSTOM PROCEDURE TRAY) ×1 IMPLANT
RELOAD ENDO STITCH (ENDOMECHANICALS) IMPLANT
RELOAD STAPLE 60 2.6 WHT THN (STAPLE) IMPLANT
RELOAD STAPLE 60 3.6 BLU REG (STAPLE) ×1 IMPLANT
RELOAD STAPLE 60 3.8 GOLD REG (STAPLE) ×1 IMPLANT
RELOAD STAPLE 60 4.1 GRN THCK (STAPLE) IMPLANT
RELOAD STAPLER BLUE 60MM (STAPLE) ×5 IMPLANT
RELOAD STAPLER GOLD 60MM (STAPLE) ×1 IMPLANT
RELOAD STAPLER GREEN 60MM (STAPLE) IMPLANT
RELOAD STAPLER WHITE 60MM (STAPLE) ×1 IMPLANT
RELOAD SUT TRIPLE-STITCH 2-0 (ENDOMECHANICALS) IMPLANT
SCISSORS LAP 5X45 EPIX DISP (ENDOMECHANICALS) ×1 IMPLANT
SET TUBE SMOKE EVAC HIGH FLOW (TUBING) ×1 IMPLANT
SHEARS HARMONIC ACE PLUS 45CM (MISCELLANEOUS) ×1 IMPLANT
SLEEVE ADV FIXATION 5X100MM (TROCAR) ×2 IMPLANT
SLEEVE GASTRECTOMY 40FR VISIGI (MISCELLANEOUS) ×1 IMPLANT
SOL ANTI FOG 6CC (MISCELLANEOUS) ×1 IMPLANT
SPIKE FLUID TRANSFER (MISCELLANEOUS) ×1 IMPLANT
STAPLE LINE REINFORCEMENT LAP (STAPLE) IMPLANT
STAPLER ECHELON LONG 60 440 (INSTRUMENTS) ×1 IMPLANT
STAPLER RELOAD BLUE 60MM (STAPLE) ×5
STAPLER RELOAD GOLD 60MM (STAPLE) ×1
STAPLER RELOAD GREEN 60MM (STAPLE)
STAPLER RELOAD WHITE 60MM (STAPLE) ×1
STRIP CLOSURE SKIN 1/2X4 (GAUZE/BANDAGES/DRESSINGS) ×1 IMPLANT
STRIP CLOSURE SKIN 1/4X4 (GAUZE/BANDAGES/DRESSINGS) IMPLANT
SUT MNCRL AB 4-0 PS2 18 (SUTURE) ×1 IMPLANT
SUT SURGIDAC NAB ES-9 0 48 120 (SUTURE) IMPLANT
SUT VICRYL 0 TIES 12 18 (SUTURE) ×1 IMPLANT
SYR 10ML ECCENTRIC (SYRINGE) ×1 IMPLANT
SYR 20ML LL LF (SYRINGE) ×1 IMPLANT
SYR 50ML LL SCALE MARK (SYRINGE) ×1 IMPLANT
SYS KII OPTICAL ACCESS 15MM (TROCAR) ×1
SYSTEM KII OPTICAL ACCESS 15MM (TROCAR) ×1 IMPLANT
TOWEL OR 17X26 10 PK STRL BLUE (TOWEL DISPOSABLE) ×1 IMPLANT
TOWEL OR NON WOVEN STRL DISP B (DISPOSABLE) ×1 IMPLANT
TROCAR ADV FIXATION 5X100MM (TROCAR) ×1 IMPLANT
TROCAR XCEL NON-BLD 5MMX100MML (ENDOMECHANICALS) ×1 IMPLANT
TUBING CONNECTING 10 (TUBING) ×1 IMPLANT
TUBING ENDO SMARTCAP (MISCELLANEOUS) ×1 IMPLANT

## 2022-07-17 NOTE — Op Note (Signed)
Operative Note  JAMILET AMBROISE  [id number]  [id number]  07/17/2022   Surgeon: Romana Juniper MD FACS   Assistant: Louanna Raw MD   Procedure performed: laparoscopic sleeve gastrectomy, upper endoscopy   Preop diagnosis: Morbid obesity  Post-op diagnosis/intraop findings: same   Specimens: fundus Retained items: none  EBL: minimal  Complications: none   Description of procedure: After obtaining informed consent and administration of chemical DVT prophylaxis in holding, the patient was taken to the operating room and placed supine on operating room table where general endotracheal anesthesia was initiated, preoperative antibiotics were administered, SCDs applied, and a formal timeout was performed. The abdomen was prepped and draped in usual sterile fashion. Peritoneal access was gained using a Visiport technique in the left upper quadrant and insufflation to 15 mmHg ensued without issue. Gross inspection revealed no evidence of injury. Under direct visualization three more 5 mm trochars were placed in the right and left hemiabdomen and the 83m trocar in the right paramedian upper abdomen. Bilateral laparoscopic assisted TAPS blocks were performed with Exparel diluted with 0.5 percent Marcaine with epinephrine. The patient was placed in steep reverse Trendelenburg and the liver retractor was introduced through an incision in the upper midline and secured to the post externally to maintain the left lobe retracted anteriorly.  There is no hiatal hernia present on direct inspection. Using the Harmonic scalpel, the greater curvature of the stomach was dissected away from the greater omentum and short gastric vessels were divided. This began 6 cm from the pylorus, and dissection proceeded until the left crus was clearly exposed.  The [id number] was then introduced and directed down towards the pylorus. This was placed to suction against the lesser curve. Serial fires of the linear cutting  stapler with staple line reinforcements were then employed to create our sleeve. The first fire used a gold load and ensured adequate room at the angularis incisura. Several blue loads were then employed to create an evenly tubular stomach, preserving about 1 cm lateral to the angle of His. The excised stomach was then removed through our 15 mm trocar site within an Endo Catch bag.  The visigi was taken off of suction and a few puffs of air were introduced, inflating the sleeve. No bubbles were observed in the irrigation fluid around the stomach and the shape was noted to be evenly tubular without any narrowing at the angularis. The visigi was then removed. Upper endoscopy was performed by the assistant surgeon and the sleeve was noted to be airtight, the lumen evenly tubular without any undue angulation or narrowing specifically at the incisura, the staple line was hemostatic. Please see his separate note. The endoscope was removed. A small amount of oozing on the staple line was addressed with clips. The 15 mm trocar site fascia in the right upper abdomen was closed with a 0 Vicryl using the laparoscopic suture passer under direct visualization. The liver retractor was removed under direct visualization. The abdomen was then desufflated and all remaining trochars removed. The skin incisions were closed with subcuticular 4-0 Monocryl; benzoin, Steri-Strips and Band-Aids were applied The patient was then awakened, extubated and taken to PACU in stable condition.     All counts were correct at the completion of the case.

## 2022-07-17 NOTE — Op Note (Signed)
   Patient: Katherine Rosario (1971-08-18, [id number])  Date of Surgery: 07/17/2022   Preoperative Diagnosis: MORBID OBESITY   Postoperative Diagnosis: * No post-op diagnosis entered *   Surgical Procedure: Upper Endoscopy   Surgeon: Louanna Raw, MD  Anesthesiologist: Albertha Ghee, MD CRNA: Lollie Sails, CRNA; Cynda Familia, CRNA   Anesthesia: General   Fluids:  Total I/O In: 100 [IV Piggyback:100] Out: 10 [CHJSC:38]  Complications: None  Drains:  None  Specimen: None   Indications for Procedure: [name] is a 51 y.o. female undergoing laparoscopic sleeve gastrectomy and an EGD was requested to evaluate foregut anatomy intraoperatively.  Description of Procedure: During the procedure, I scrubbed out and obtained the Olympus endoscope. I gently placed endoscope in the patient's oropharynx and gently glided it down the esophagus without any difficulty under direct visualization.  The scope was advanced as far as the pylorus and then slowly withdrawn to inspect the foregut anatomy.  Dr. Kae Heller had placed saline in the upper abdomen and all staple lines were submerged to ensure no air leak. There was no evidence of bubbles. There was no evidence of intraluminal bleeding and the mucosa appeared healthy.  The lumen was widely patent without evidence of stricture.  The intraluminal insufflation was decompressed. The scope was withdrawn. The patient tolerated this portion of the procedure well. Please see Dr Ron Parker operative note for details regarding the remainder of the procedure.    Louanna Raw, MD General, Bariatric, & Minimally Invasive Surgery [facility], [place]

## 2022-07-17 NOTE — Progress Notes (Signed)
PHARMACY CONSULT FOR:  Risk Assessment for Post-Discharge VTE Following Bariatric Surgery  Post-Discharge VTE Risk Assessment: This patient's probability of 30-day post-discharge VTE is increased due to the factors marked: x Sleeve gastrectomy   Liver disorder (transplant, cirrhosis, or nonalcoholic steatohepatitis)   Hx of VTE   Hemorrhage requiring transfusion   GI perforation, leak, or obstruction   ====================================================    Female    Age >/=60 years    BMI >/=50 kg/m2    CHF    Dyspnea at Rest    Paraplegia  x  Non-gastric-band surgery    Operation Time >/=3 hr    Return to OR     Length of Stay >/= 3 d   Hypercoagulable condition   Significant venous stasis    - Per H&P, some concern for hepatic steatosis. Discussed with Dr. Windle Guard in regards to above risk factors. Since pt does not have a formal diagnosis, will dose as pt not having this risk factor as above.   Predicted probability of 30-day post-discharge VTE: - 0.16%  Other patient-specific factors to consider: - No noted history of PE/DVT  Recommendation for Discharge: No pharmacologic prophylaxis post-discharge        Katherine Rosario is a 51 y.o. female who underwent  sleeve gastrectomy   on 07/17/22    Case start: 0756 Case end: 0845   No Known Allergies  Patient Measurements:   There is no height or weight on file to calculate BMI.  No results for input(s): "WBC", "HGB", "HCT", "PLT", "APTT", "CREATININE", "LABCREA", "CREAT24HRUR", "MG", "PHOS", "ALBUMIN", "PROT", "AST", "ALT", "ALKPHOS", "BILITOT", "BILIDIR", "IBILI" in the last 72 hours. Estimated Creatinine Clearance: 88.6 mL/min (by C-G formula based on SCr of 0.76 mg/dL).    Past Medical History:  Diagnosis Date   Allergic rhinitis    Anemia    Heavy menstrual flow/OTC FE BID:     Asthma    Family history of adverse reaction to anesthesia    Dad's heart stopped after surgery   Fibroids    GERD  (gastroesophageal reflux disease)    Heart palpitations    History of kidney stones    Hyperlipidemia    Migraines    OSA (obstructive sleep apnea) 05/06/2014   Pneumonia    Rheumatoid arthritis (HCC)      Medications Prior to Admission  Medication Sig Dispense Refill Last Dose   albuterol (VENTOLIN HFA) 108 (90 Base) MCG/ACT inhaler INHALE 2 PUFFS INTO THE LUNGS EVERY 6 HOURS AS NEEDED FOR WHEEZING OR SHORTNESS OF BREATH (Patient taking differently: Inhale 2 puffs into the lungs every 6 (six) hours as needed for wheezing or shortness of breath.) 6.7 g 0    B Complex-C (B-COMPLEX WITH VITAMIN C) tablet Take 1 tablet by mouth daily.   07/16/2022   budesonide-formoterol (SYMBICORT) 160-4.5 MCG/ACT inhaler Inhale 2 puffs into the lungs in the morning and at bedtime. 10.2 g 5 07/16/2022   cetirizine (ZYRTEC) 10 MG tablet Take 10 mg by mouth daily.   07/16/2022   Cholecalciferol (VITAMIN D) 125 MCG (5000 UT) CAPS Take 5,000 Units by mouth daily.   07/16/2022   fluticasone (FLONASE) 50 MCG/ACT nasal spray Place 2 sprays into both nostrils daily. (Patient taking differently: Place 2 sprays into both nostrils daily as needed for allergies.) 16 g 6 Past Week   folic acid (FOLVITE) 1 MG tablet Take 1 mg by mouth daily.   07/16/2022   methotrexate 2.5 MG tablet Take 20 mg by mouth once a  week. Saturday (Taking 8 tabs weekly)   Past Week   omeprazole (PRILOSEC) 20 MG capsule Take 1 capsule (20 mg total) by mouth daily. (Patient taking differently: Take 20 mg by mouth daily. OTC) 90 capsule 3 07/17/2022   rosuvastatin (CRESTOR) 5 MG tablet TAKE 1 TABLET(5 MG) BY MOUTH AT BEDTIME (Patient taking differently: Take 5 mg by mouth daily.) 90 tablet 3 07/16/2022   SUMAtriptan (IMITREX) 100 MG tablet ONSET of migraine;repeat in 2 hours if needed 10 tablet 3 Past Week   golimumab (SIMPONI ARIA) 50 MG/4ML SOLN injection Inject 50 mg into the vein every 8 (eight) weeks. Dose may change due to weight   More than a  month       Royetta Asal, PharmD, BCPS 07/17/2022 12:51 PM

## 2022-07-17 NOTE — Progress Notes (Signed)
Mobility Specialist - Progress Note   07/17/22 1439  Oxygen Therapy  O2 Device Nasal Cannula  O2 Flow Rate (L/min) 2 L/min  Mobility  Activity Ambulated independently in hallway  Level of Assistance Independent after set-up  Assistive Device None  Distance Ambulated (ft) 100 ft  Range of Motion/Exercises Active  Activity Response Tolerated well  Mobility Referral Yes  $Mobility charge 1 Mobility   Pt was found in bed and agreeable to ambulate. Had no complaints during ambulation and at EOS returned to bed with necessities in reach.  Ferd Hibbs Mobility Specialist

## 2022-07-17 NOTE — Anesthesia Procedure Notes (Signed)
Procedure Name: Intubation Date/Time: 07/17/2022 7:23 AM  Performed by: Lollie Sails, CRNAPre-anesthesia Checklist: Patient identified, Emergency Drugs available, Suction available, Patient being monitored and Timeout performed Patient Re-evaluated:Patient Re-evaluated prior to induction Oxygen Delivery Method: Circle system utilized Preoxygenation: Pre-oxygenation with 100% oxygen Induction Type: IV induction Ventilation: Mask ventilation without difficulty Laryngoscope Size: Miller and 3 Grade View: Grade I Tube type: Oral Tube size: 7.0 mm Number of attempts: 1 Airway Equipment and Method: Stylet Placement Confirmation: ETT inserted through vocal cords under direct vision, positive ETCO2 and breath sounds checked- equal and bilateral Secured at: 23 cm Tube secured with: Tape Dental Injury: Teeth and Oropharynx as per pre-operative assessment

## 2022-07-17 NOTE — Progress Notes (Signed)
Discussed QI "Goals for Discharge" document with patient including ambulation in halls, Incentive Spirometry use every hour, and oral care.  Also discussed pain and nausea control.  BSTOP education provided including BSTOP information guide, "Guide for Pain Management after your Bariatric Procedure".  Diet progression education provided including "Bariatric Surgery Post-Op Food Plan Phase 1: Liquids".  Questions answered.  Will continue to partner with bedside RN and follow up with patient per protocol.

## 2022-07-17 NOTE — Discharge Instructions (Signed)

## 2022-07-17 NOTE — Transfer of Care (Signed)
Immediate Anesthesia Transfer of Care Note  Patient: Katherine Rosario  Procedure(s) Performed: LAPAROSCOPIC SLEEVE GASTRECTOMY UPPER GI ENDOSCOPY  Patient Location: PACU  Anesthesia Type:General  Level of Consciousness: awake, drowsy, and responds to stimulation  Airway & Oxygen Therapy: Patient Spontanous Breathing and Patient connected to face mask oxygen  Post-op Assessment: Report given to RN and Post -op Vital signs reviewed and stable  Post vital signs: Reviewed and stable  Last Vitals:  Vitals Value Taken Time  BP 166/102 07/17/22 0853  Temp    Pulse 79 07/17/22 0855  Resp 8 07/17/22 0855  SpO2 95 % 07/17/22 0855  Vitals shown include unvalidated device data.  Last Pain:  Vitals:   07/17/22 0641  TempSrc: Oral  PainSc:          Complications: No notable events documented.

## 2022-07-17 NOTE — Anesthesia Postprocedure Evaluation (Signed)
Anesthesia Post Note  Patient: Katherine Rosario  Procedure(s) Performed: LAPAROSCOPIC SLEEVE GASTRECTOMY UPPER GI ENDOSCOPY     Patient location during evaluation: PACU Anesthesia Type: General Level of consciousness: awake and alert Pain management: pain level controlled Vital Signs Assessment: post-procedure vital signs reviewed and stable Respiratory status: spontaneous breathing, nonlabored ventilation, respiratory function stable and patient connected to nasal cannula oxygen Cardiovascular status: blood pressure returned to baseline and stable Postop Assessment: no apparent nausea or vomiting Anesthetic complications: no   No notable events documented.  Last Vitals:  Vitals:   07/17/22 1300 07/17/22 1408  BP: (!) 141/82 (!) 146/86  Pulse: 78 78  Resp: 17 17  Temp: 36.9 C 36.6 C  SpO2: 99% 99%    Last Pain:  Vitals:   07/17/22 1408  TempSrc: Oral  PainSc:                  [name]

## 2022-07-17 NOTE — Interval H&P Note (Signed)
History and Physical Interval Note:  07/17/2022 7:03 AM  Katherine Rosario  has presented today for surgery, with the diagnosis of MORBID OBESITY.  The various methods of treatment have been discussed with the patient and family. After consideration of risks, benefits and other options for treatment, the patient has consented to  Procedure(s): LAPAROSCOPIC SLEEVE GASTRECTOMY (N/A) UPPER GI ENDOSCOPY (N/A) as a surgical intervention.  The patient's history has been reviewed, patient examined, no change in status, stable for surgery.  I have reviewed the patient's chart and labs.  Questions were answered to the patient's satisfaction.     Yuan Gann Rich Brave

## 2022-07-17 NOTE — Anesthesia Preprocedure Evaluation (Signed)
Anesthesia Evaluation  Patient identified by MRN, date of birth, ID band Patient awake    Reviewed: Allergy & Precautions, H&P , NPO status , Patient's Chart, lab work & pertinent test results  Airway Mallampati: II   Neck ROM: full    Dental   Pulmonary asthma , sleep apnea    breath sounds clear to auscultation       Cardiovascular negative cardio ROS  Rhythm:regular Rate:Normal     Neuro/Psych  Headaches    GI/Hepatic ,GERD  ,,  Endo/Other    Morbid obesity  Renal/GU stones     Musculoskeletal  (+) Arthritis ,    Abdominal   Peds  Hematology   Anesthesia Other Findings   Reproductive/Obstetrics                             Anesthesia Physical Anesthesia Plan  ASA: 2  Anesthesia Plan: General   Post-op Pain Management:    Induction: Intravenous  PONV Risk Score and Plan: 3 and Ondansetron, Dexamethasone, Midazolam and Treatment may vary due to age or medical condition  Airway Management Planned: Oral ETT  Additional Equipment:   Intra-op Plan:   Post-operative Plan: Extubation in OR  Informed Consent: I have reviewed the patients History and Physical, chart, labs and discussed the procedure including the risks, benefits and alternatives for the proposed anesthesia with the patient or authorized representative who has indicated his/her understanding and acceptance.     Dental advisory given  Plan Discussed with: CRNA, Anesthesiologist and Surgeon  Anesthesia Plan Comments:        Anesthesia Quick Evaluation

## 2022-07-18 ENCOUNTER — Encounter (HOSPITAL_COMMUNITY): Payer: Self-pay | Admitting: Surgery

## 2022-07-18 LAB — CBC WITH DIFFERENTIAL/PLATELET
Abs Immature Granulocytes: 0.02 10*3/uL (ref 0.00–0.07)
Basophils Absolute: 0 10*3/uL (ref 0.0–0.1)
Basophils Relative: 0 %
Eosinophils Absolute: 0 10*3/uL (ref 0.0–0.5)
Eosinophils Relative: 0 %
HCT: 45.8 % (ref 36.0–46.0)
Hemoglobin: 14.8 g/dL (ref 12.0–15.0)
Immature Granulocytes: 0 %
Lymphocytes Relative: 15 %
Lymphs Abs: 1.2 10*3/uL (ref 0.7–4.0)
MCH: 30.6 pg (ref 26.0–34.0)
MCHC: 32.3 g/dL (ref 30.0–36.0)
MCV: 94.8 fL (ref 80.0–100.0)
Monocytes Absolute: 0.7 10*3/uL (ref 0.1–1.0)
Monocytes Relative: 9 %
Neutro Abs: 6.3 10*3/uL (ref 1.7–7.7)
Neutrophils Relative %: 76 %
Platelets: 318 10*3/uL (ref 150–400)
RBC: 4.83 MIL/uL (ref 3.87–5.11)
RDW: 14 % (ref 11.5–15.5)
WBC: 8.2 10*3/uL (ref 4.0–10.5)
nRBC: 0 % (ref 0.0–0.2)

## 2022-07-18 LAB — COMPREHENSIVE METABOLIC PANEL
ALT: 75 U/L — ABNORMAL HIGH (ref 0–44)
AST: 37 U/L (ref 15–41)
Albumin: 4.2 g/dL (ref 3.5–5.0)
Alkaline Phosphatase: 66 U/L (ref 38–126)
Anion gap: 9 (ref 5–15)
BUN: 9 mg/dL (ref 6–20)
CO2: 25 mmol/L (ref 22–32)
Calcium: 8.8 mg/dL — ABNORMAL LOW (ref 8.9–10.3)
Chloride: 105 mmol/L (ref 98–111)
Creatinine, Ser: 0.67 mg/dL (ref 0.44–1.00)
GFR, Estimated: >60 mL/min (ref >60)
Glucose, Bld: 92 mg/dL (ref 70–99)
Potassium: 4.1 mmol/L (ref 3.5–5.1)
Sodium: 139 mmol/L (ref 135–145)
Total Bilirubin: 0.8 mg/dL (ref 0.3–1.2)
Total Protein: 7.6 g/dL (ref 6.5–8.1)

## 2022-07-18 LAB — MAGNESIUM: Magnesium: 2.3 mg/dL (ref 1.7–2.4)

## 2022-07-18 LAB — SURGICAL PATHOLOGY

## 2022-07-18 MED ORDER — TRAMADOL HCL 50 MG PO TABS: 50.0000 mg | ORAL_TABLET | Freq: Four times a day (QID) | ORAL | 0 refills | Status: DC | PRN

## 2022-07-18 MED ORDER — ACETAMINOPHEN 500 MG PO TABS: 1000.0000 mg | ORAL_TABLET | Freq: Three times a day (TID) | ORAL | 0 refills | Status: AC

## 2022-07-18 MED ORDER — GABAPENTIN 100 MG PO CAPS: 200.0000 mg | ORAL_CAPSULE | Freq: Two times a day (BID) | ORAL | 0 refills | Status: DC

## 2022-07-18 MED ORDER — PANTOPRAZOLE SODIUM 40 MG PO TBEC: 40.0000 mg | DELAYED_RELEASE_TABLET | Freq: Every day | ORAL | 0 refills | Status: AC

## 2022-07-18 MED ORDER — ONDANSETRON 4 MG PO TBDP: 4.0000 mg | ORAL_TABLET | Freq: Four times a day (QID) | ORAL | 0 refills | Status: DC | PRN

## 2022-07-18 NOTE — Progress Notes (Signed)
Communicated with surgeon about patient status (working on protein cup #2) and received message to continue with discharge as ordered; notified primary RN, charge, and patient of communication. acknowledged

## 2022-07-18 NOTE — Plan of Care (Signed)
Patient is stable for discharge, discharge instructions given. All questions answered, patient is discharged to home with family.

## 2022-07-18 NOTE — Progress Notes (Signed)
  Transition of Care [facility]) Screening Note   Patient Details  Name: Katherine Rosario Date of Birth: 03-05-1971   Transition of Care [facility]) CM/SW Contact:    Vassie Moselle, LCSW Phone Number: 07/18/2022, 8:44 AM    Transition of Care Department [facility]) has reviewed patient and no TOC needs have been identified at this time. We will continue to monitor patient advancement through interdisciplinary progression rounds. If new patient transition needs arise, please place a TOC consult.

## 2022-07-18 NOTE — Discharge Summary (Signed)
Physician Discharge Summary  Patient ID: Katherine Rosario MRN: [id number] DOB/AGE: Feb 15, 1971 51 y.o.  Admit date: 07/17/2022 Discharge date: 07/18/2022  Admission Diagnoses: Morbid obesity  Discharge Diagnoses:  Principal Problem:   Morbid obesity [facility])   Discharged Condition: good  Hospital Course: Katherine Rosario was admitted for routine postsurgical care following an uneventful laparoscopic sleeve gastrectomy.  She had an uneventful evening and on postop day 1 was tolerating liquids without significant nausea, reflux, or dysphagia.  She is ambulating independently.  Her pain/nausea is minimal and well controlled.  Her vital signs and lab work were within normal limits.    Discharge Exam: Blood pressure 136/85, pulse 73, temperature 99 F (37.2 C), temperature source Oral, resp. rate 16, last menstrual period 10/24/2014, SpO2 95 %. Alert, well-appearing Unlabored respirations Abdomen is soft, nontender, nondistended.  Incisions are clean, dry and intact with Steri-Strips and Band-Aids in place, no cellulitis or hematoma or significant ecchymosis. Extremity edema  CBC    Component Value Date/Time   WBC 8.2 07/18/2022 0414   RBC 4.83 07/18/2022 0414   HGB 14.8 07/18/2022 0414   HCT 45.8 07/18/2022 0414   PLT 318 07/18/2022 0414   MCV 94.8 07/18/2022 0414   MCH 30.6 07/18/2022 0414   MCHC 32.3 07/18/2022 0414   RDW 14.0 07/18/2022 0414   LYMPHSABS 1.2 07/18/2022 0414   MONOABS 0.7 07/18/2022 0414   EOSABS 0.0 07/18/2022 0414   BASOSABS 0.0 07/18/2022 0414    BMET    Component Value Date/Time   NA 139 07/18/2022 0414   K 4.1 07/18/2022 0414   CL 105 07/18/2022 0414   CO2 25 07/18/2022 0414   GLUCOSE 92 07/18/2022 0414   BUN 9 07/18/2022 0414   CREATININE 0.67 07/18/2022 0414   CALCIUM 8.8 (L) 07/18/2022 0414   GFRNONAA >60 07/18/2022 0414  Magnesium- 2.3    Disposition: Discharge disposition: 01-Home or Self Care        Allergies as of 07/18/2022   No  Known Allergies      Medication List     STOP taking these medications    omeprazole 20 MG capsule Commonly known as: PRILOSEC       TAKE these medications    acetaminophen 500 MG tablet Commonly known as: TYLENOL Take 2 tablets (1,000 mg total) by mouth every 8 (eight) hours for 5 days.   albuterol 108 (90 Base) MCG/ACT inhaler Commonly known as: VENTOLIN HFA INHALE 2 PUFFS INTO THE LUNGS EVERY 6 HOURS AS NEEDED FOR WHEEZING OR SHORTNESS OF BREATH   B-complex with vitamin C tablet Take 1 tablet by mouth daily.   budesonide-formoterol 160-4.5 MCG/ACT inhaler Commonly known as: Symbicort Inhale 2 puffs into the lungs in the morning and at bedtime.   cetirizine 10 MG tablet Commonly known as: ZYRTEC Take 10 mg by mouth daily.   fluticasone 50 MCG/ACT nasal spray Commonly known as: FLONASE Place 2 sprays into both nostrils daily. What changed:  when to take this reasons to take this   folic acid 1 MG tablet Commonly known as: FOLVITE Take 1 mg by mouth daily.   gabapentin 100 MG capsule Commonly known as: NEURONTIN Take 2 capsules (200 mg total) by mouth every 12 (twelve) hours.   golimumab 50 MG/4ML Soln injection Commonly known as: SIMPONI ARIA Inject 50 mg into the vein every 8 (eight) weeks. Dose may change due to weight   methotrexate 2.5 MG tablet Commonly known as: RHEUMATREX Take 20 mg by mouth once a week.  Saturday (Taking 8 tabs weekly)   ondansetron 4 MG disintegrating tablet Commonly known as: ZOFRAN-ODT Take 1 tablet (4 mg total) by mouth every 6 (six) hours as needed for nausea or vomiting.   pantoprazole 40 MG tablet Commonly known as: PROTONIX Take 1 tablet (40 mg total) by mouth daily. Take this medication daily, regardless of reflux symptoms   rosuvastatin 5 MG tablet Commonly known as: CRESTOR TAKE 1 TABLET(5 MG) BY MOUTH AT BEDTIME What changed: See the new instructions.   SUMAtriptan 100 MG tablet Commonly known as:  Imitrex ONSET of migraine;repeat in 2 hours if needed   traMADol 50 MG tablet Commonly known as: ULTRAM Take 1 tablet (50 mg total) by mouth every 6 (six) hours as needed (pain).   Vitamin D 125 MCG (5000 UT) Caps Take 5,000 Units by mouth daily.        Follow-up Information     Clovis Riley, MD. Go on 08/10/2022.   Specialty: General Surgery Why: at 9:10am. Please arrive 15 minutes prior to your appointment time.  Thank you. Contact information: [address] [place] [address] [phone]         Clovis Riley, MD. Go on 09/13/2022.   Specialty: General Surgery Why: at 2:20pm. Please arrive 15 minutes prior to your appointment time. Thank you. Contact information: [address] [place] [address] [phone]                 Signed: Clovis Riley 07/18/2022, 11:24 PM

## 2022-07-18 NOTE — Progress Notes (Signed)
Patient alert and oriented, pain is controlled. Patient is tolerating fluids, will advance to protein shake today. Reviewed Gastric sleeve discharge instructions with patient and patient is able to articulate understanding. Provided information on BELT program, Support Group and [facility] outpatient pharmacy. All questions answered, will continue to monitor. Current 24hr fluid recall is 270 mL per hydration protocol, bariatric nurse coordinator to make follow-up phone call within one week.

## 2022-07-18 NOTE — Progress Notes (Signed)
Patient alert and oriented, Post op day 1.  Provided support and encouragement.  Encouraged  toileting, ambulation, and small sips of liquids.  All questions answered.  Will continue to monitor.

## 2022-07-21 ENCOUNTER — Telehealth (HOSPITAL_COMMUNITY): Payer: Self-pay | Admitting: *Deleted

## 2022-07-21 NOTE — Telephone Encounter (Signed)
1.  Tell me about your pain and pain management? Pt c/o right lower abdominal incisional soreness with movement and exertion.  Pt states that she has been taking Tylenol. Discussed with patient to try and splint her abdomen with changing positions to assist with the discomfort.  Encouraged pt to try options and/or contact [facility] if still concerned.   2.  Let's talk about fluid intake.  How much total fluid are you taking in? Pt states that she is getting in at least 64oz of fluid including protein shakes, bottled water, and Gatorade.   3.  How much protein have you taken in the last 2 days? Pt states she is meeting her goal of 60g of protein each day with the protein shakes.  4.  Have you had nausea?  Tell me about when have experienced nausea and what you did to help? Pt denies nausea.   5.  Has the frequency or color changed with your urine? Pt states that she is urinating "fine" with no changes in frequency or urgency.     6.  Tell me what your incisions look like? "Incisions look fine". Pt denies a fever, chills.  Pt states incisions are not swollen, open, or draining.  Pt encouraged to call [facility] if incisions change.   7.  Have you been passing gas? BM? Pt states that she has not had a BM.  Pt instructed to take either Miralax or MoM as instructed per "Gastric Bypass/Sleeve Discharge Home Care Instructions".  Pt to call surgeon's office if not able to have BM with medication.   8.  If a problem or question were to arise who would you call?  Do you know contact numbers for [facility], [facility], and [facility]? Pt denies dehydration symptoms.  Pt can describe s/sx of dehydration.  Pt knows to call [facility] for surgical, [facility] for nutrition, and [facility] for non-urgent questions or concerns.   9.  How has the walking going? Pt states she is walking around and able to be active without difficulty.   10. Are you still using your incentive spirometer?  If so, how often? Pt states that she is doing the I.S.   Pt encouraged  to use incentive spirometer, at least 10x every hour while awake until she sees the surgeon.  11.  How are your vitamins and calcium going?  How are you taking them? Pt states that she will begin the supplements next week.  Reinforced education about taking supplements at least two hours apart.  Reminded patient that the first 30 days post-operatively are important for successful recovery.  Practice good hand hygiene, wearing a mask when appropriate (since optional in most places), and minimizing exposure to people who live outside of the home, especially if they are exhibiting any respiratory, GI, or illness-like symptoms.

## 2022-08-01 ENCOUNTER — Encounter: Payer: BC Managed Care – PPO | Attending: Surgery | Admitting: Skilled Nursing Facility1

## 2022-08-03 ENCOUNTER — Encounter: Payer: Self-pay | Admitting: Skilled Nursing Facility1

## 2022-08-03 DIAGNOSIS — G4733 Obstructive sleep apnea (adult) (pediatric): Secondary | ICD-10-CM | POA: Diagnosis not present

## 2022-08-03 NOTE — Progress Notes (Signed)
2 Week Post-Operative Nutrition Class   Patient was seen on 08/01/22 for Post-Operative Nutrition education at the [facility].    Surgery date: 07/17/22 Surgery type: Sleeve Bowel Habits: Every day to every other day no complaints  Anthropometrics  Start weight at [facility]: 221.3 lbs (date: 05/16/2022)  Height: 61 in Weight today: 201.3 lbs. BMI: 38.1 kg/m2     Clinical  Medical hx: Asthma, sleep apnea, obesity, GERD, anemia Medications: Methotrexate, omeprazole, folic acid, Symbicort, rosuvastatin, Simponi aria   Labs: glucose 103; lipid panel [facility]NL Notable signs/symptoms: none noted Any previous deficiencies? No   Body Composition Scale 08/01/22  Current Body Weight 201.3  Total Body Fat % 43.0  Visceral Fat 15  Fat-Free Mass % 56.9   Total Body Water % 42.9  Muscle-Mass lbs 27.8  BMI 38.1  Body Fat Displacement          Torso  lbs 53.6         Left Leg  lbs 10.7         Right Leg  lbs 10.7         Left Arm  lbs 5.3         Right Arm   lbs 5.3      The following the learning objectives were met by the patient during this course: Identifies Phase 3 (Soft, High Proteins) Dietary Goals and will begin from 2 weeks post-operatively to 2 months post-operatively Identifies appropriate sources of fluids and proteins  Identifies appropriate fat sources and healthy verses unhealthy fat types   States protein recommendations and appropriate sources post-operatively Identifies the need for appropriate texture modifications, mastication, and bite sizes when consuming solids Identifies appropriate fat consumption and sources Identifies appropriate multivitamin and calcium sources post-operatively Describes the need for physical activity post-operatively and will follow MD recommendations States when to call healthcare provider regarding medication questions or post-operative complications   Handouts given during class include: Phase 3A: Soft, High  Protein Diet Handout Phase 3 High Protein Meals Healthy Fats   Follow-Up Plan: Patient will follow-up at [facility] in 6 weeks for 2 month post-op nutrition visit for diet advancement per MD.

## 2022-08-07 ENCOUNTER — Telehealth: Payer: Self-pay | Admitting: Dietician

## 2022-08-07 NOTE — Telephone Encounter (Signed)
RD called pt to verify fluid intake once starting soft, solid proteins 2 week post-bariatric surgery.   Daily Fluid intake: 42 oz.  Daily Protein intake: 58 grams Bowel Habits: every other day / no pain from constipation   Concerns/issues: no concerns with foods / father unexpectedly passed away in an auto accident

## 2022-08-18 DIAGNOSIS — M0609 Rheumatoid arthritis without rheumatoid factor, multiple sites: Secondary | ICD-10-CM | POA: Diagnosis not present

## 2022-08-19 ENCOUNTER — Other Ambulatory Visit: Payer: Self-pay | Admitting: Adult Health

## 2022-08-19 DIAGNOSIS — J4541 Moderate persistent asthma with (acute) exacerbation: Secondary | ICD-10-CM

## 2022-08-20 ENCOUNTER — Other Ambulatory Visit: Payer: Self-pay | Admitting: Adult Health

## 2022-08-20 DIAGNOSIS — Z8669 Personal history of other diseases of the nervous system and sense organs: Secondary | ICD-10-CM

## 2022-08-22 MED ORDER — ALBUTEROL SULFATE HFA 108 (90 BASE) MCG/ACT IN AERS: 2.0000 | INHALATION_SPRAY | Freq: Four times a day (QID) | RESPIRATORY_TRACT | 0 refills | Status: AC | PRN

## 2022-09-02 DIAGNOSIS — G4733 Obstructive sleep apnea (adult) (pediatric): Secondary | ICD-10-CM | POA: Diagnosis not present

## 2022-09-08 ENCOUNTER — Ambulatory Visit: Payer: BC Managed Care – PPO | Admitting: Nurse Practitioner

## 2022-09-08 ENCOUNTER — Ambulatory Visit: Payer: BC Managed Care – PPO | Admitting: *Deleted

## 2022-09-08 ENCOUNTER — Encounter: Payer: Self-pay | Admitting: Nurse Practitioner

## 2022-09-08 VITALS — BP 106/72 | HR 74 | Ht 61.5 in | Wt 189.4 lb

## 2022-09-08 DIAGNOSIS — K219 Gastro-esophageal reflux disease without esophagitis: Secondary | ICD-10-CM

## 2022-09-08 DIAGNOSIS — J4520 Mild intermittent asthma, uncomplicated: Secondary | ICD-10-CM | POA: Diagnosis not present

## 2022-09-08 DIAGNOSIS — G4733 Obstructive sleep apnea (adult) (pediatric): Secondary | ICD-10-CM | POA: Diagnosis not present

## 2022-09-08 DIAGNOSIS — R0602 Shortness of breath: Secondary | ICD-10-CM

## 2022-09-08 LAB — PULMONARY FUNCTION TEST
DL/VA % pred: 111 %
DL/VA: 4.87 ml/min/mmHg/L
DLCO cor % pred: 106 %
DLCO cor: 20.35 ml/min/mmHg
DLCO unc % pred: 110 %
DLCO unc: 21.17 ml/min/mmHg
FEF 25-75 Post: 2.5 L/sec
FEF 25-75 Pre: 2.34 L/sec
FEF2575-%Change-Post: 6 %
FEF2575-%Pred-Post: 99 %
FEF2575-%Pred-Pre: 93 %
FEV1-%Change-Post: 3 %
FEV1-%Pred-Post: 94 %
FEV1-%Pred-Pre: 91 %
FEV1-Post: 2.38 L
FEV1-Pre: 2.3 L
FEV1FVC-%Change-Post: 0 %
FEV1FVC-%Pred-Pre: 101 %
FEV6-%Change-Post: 3 %
FEV6-%Pred-Post: 95 %
FEV6-%Pred-Pre: 91 %
FEV6-Post: 2.94 L
FEV6-Pre: 2.84 L
FEV6FVC-%Pred-Post: 103 %
FEV6FVC-%Pred-Pre: 103 %
FVC-%Change-Post: 3 %
FVC-%Pred-Post: 92 %
FVC-%Pred-Pre: 89 %
FVC-Post: 2.94 L
FVC-Pre: 2.84 L
Post FEV1/FVC ratio: 81 %
Post FEV6/FVC ratio: 100 %
Pre FEV1/FVC ratio: 81 %
Pre FEV6/FVC Ratio: 100 %
RV % pred: 106 %
RV: 1.83 L
TLC % pred: 97 %
TLC: 4.57 L

## 2022-09-08 MED ORDER — BUDESONIDE-FORMOTEROL FUMARATE 80-4.5 MCG/ACT IN AERO: 2.0000 | INHALATION_SPRAY | Freq: Two times a day (BID) | RESPIRATORY_TRACT | 5 refills | Status: DC

## 2022-09-08 NOTE — Assessment & Plan Note (Addendum)
Good compliance and receives benefit from use. She has excellent control of events with use. I am going to tighten her parameters to 5-8 cmH2O based on average pressure 7.8 on download. She will work on using this nightly. Cautioned on safe driving practices. Healthy weight loss encouraged  Patient Instructions  Continue CPAP every night, minimum of 4-6 hours a night. Change settings to 5-8 cmH2O Change equipment every 30 days or as directed by DME. Wash your tubing with warm soap and water daily, hang to dry. Wash humidifier portion weekly.  Be aware of reduced alertness and do not drive or operate heavy machinery if experiencing this or drowsiness.  Exercise encouraged, as tolerated. Avoid or decrease alcohol consumption and medications that make you more sleepy, if possible. Notify if persistent daytime sleepiness occurs even with consistent use of CPAP.   Decrease Symbicort dose to 80 mcg - 2 puffs Twice daily. Brush tongue and rinse mouth afterwards. If you feel your breathing is stable over the next few months, you can decreased to 1 puff Twice daily  Continue Albuterol inhaler 2 puffs every 6 hours as needed for shortness of breath or wheezing. Notify if symptoms persist despite rescue inhaler/neb use.    Follow up in 6 months with Dr. Vaughan Browner or Alanson Aly. If symptoms do not improve or worsen, please contact office for sooner follow up or seek emergency care.

## 2022-09-08 NOTE — Assessment & Plan Note (Signed)
Stable and compensated on current regimen. We will de-escalate her to 80 mcg dose after she completes current inhaler. May consider further reduction to 1 puff Twice daily or PRN use. Asthma action plan in place

## 2022-09-08 NOTE — Patient Instructions (Signed)
Continue CPAP every night, minimum of 4-6 hours a night. Change settings to 5-8 cmH2O Change equipment every 30 days or as directed by DME. Wash your tubing with warm soap and water daily, hang to dry. Wash humidifier portion weekly.  Be aware of reduced alertness and do not drive or operate heavy machinery if experiencing this or drowsiness.  Exercise encouraged, as tolerated. Avoid or decrease alcohol consumption and medications that make you more sleepy, if possible. Notify if persistent daytime sleepiness occurs even with consistent use of CPAP.   Decrease Symbicort dose to 80 mcg - 2 puffs Twice daily. Brush tongue and rinse mouth afterwards. If you feel your breathing is stable over the next few months, you can decreased to 1 puff Twice daily  Continue Albuterol inhaler 2 puffs every 6 hours as needed for shortness of breath or wheezing. Notify if symptoms persist despite rescue inhaler/neb use.    Follow up in 6 months with Dr. Vaughan Browner or Alanson Aly. If symptoms do not improve or worsen, please contact office for sooner follow up or seek emergency care.

## 2022-09-08 NOTE — Assessment & Plan Note (Signed)
Well-controlled on current regimen. ?

## 2022-09-08 NOTE — Progress Notes (Signed)
$'@Patient'b$  ID: Katherine Rosario, female    DOB: 01-22-1971, 52 y.o.   MRN: [id number]  Chief Complaint  Patient presents with   Follow-up    Pt f/u to discuss PFT results, she also has OSA and uses a CPAP.     Referring provider: Dorothyann Peng, NP  HPI: 52 year old female, never smoker followed for asthma and OSA. She is a patient of Dr. Matilde Bash and last seen in office 06/16/2022 by [facility] NP. Past medical history significant for migraine, allergic rhinitis, GERD, HLD.  TEST/EVENTS:  07/06/2014 HST: AHI 11.9, SpO2 low 65%, average 94% 03/28/2022 HRCT chest: no evidence of fibrotic interstitial lung disease. Mild, lobular air trapping. There are occasional tiny nodules in the dependent LLL measuring 0.2 cm.  05/28/2022 HST: AHI 46.5/hr; SpO2 low 67%, average 93%  03/20/2022: OV with Dr. Vaughan Browner. Changed from [place] to Symbicort in June 2023. Doing much better on this with improvement of cough and dyspnea. Use flonase, chlortab and pepcid. HRCT to rule out RA ILD. Hx of OSA but quit using CPAP 3 years ago - HST ordered for further evaluation.   06/16/2022: OV with Andrina Locken NP to discuss her home sleep study results, which revealed severe OSA with AHI 46.5/h. She was on CPAP years ago, around 6-7 years ago, but her machine broke and she got it fixed once but then it broke again so she stopped using it. She struggles with excessive fatigue. She wakes up feeling poorly rested and wants to go back to bed. She snores very loudly. She has woken up gasping for air before. She does struggle with drowsy driving, especially when she's going into work. She has fallen asleep driving before but it's been years ago. She denies any sleep parasomnias/paralysis, history of narcolepsy or cataplexy.  She works third shirt from 10pm-8 am. She goes to bed around 11 am-noon. Wakes a few times to use the restroom. Officially gets out of the bed around 7-8pm. Doesn't have trouble falling asleep. She doesn't take any sleep  aids. Her breathing is stable. Feels like the symbicort works well for her. She rarely has to use her albuterol. Ran a 5K last week and used it. No increased cough, chest congestion or wheezing. She is getting ready for gastric sleeve surgery; tentatively set for November 2023.   09/08/2021: Today-follow-up Patient presents today for follow-up and CPAP compliance.  She restarted CPAP therapy since she was here last.  Has been tolerating it well.  She did have some days where she missed using it last month due to the passing of her father in an unexpected car accident.  She had been traveling a lot and did not always take it back and forth with her.  Her sleep has also been a little affected by this to and she has not been sleeping as long as she normally does.  Trying to get back in a normal pattern.  Otherwise, she has not had any issues.  No significant leaks.  Feels like it is helping with her daytime fatigue symptoms.  Feels like she has not has much trouble with drowsy driving. Overall, her breathing has been stable.  She is currently on Symbicort 160 twice daily.  Rare use of albuterol.  No increased cough, congestion, wheezing.  Underwent gastric sleeve surgery in November.  No postoperative complications.  08/09/2022-09/07/2021 CPAP auto 5-20 cmH2O 25/30 days used; 77% >4hr; average usage 6 hours 12 minutes Pressure median 5.7, 95th 7.8 Leaks median 0, 95th 5.2  AHI 1.5  No Known Allergies  Immunization History  Administered Date(s) Administered   DTaP 05/01/2009   Influenza Inj Mdck Quad Pf 06/15/2022   Influenza Split 06/04/2013   Influenza,inj,Quad PF,6+ Mos 06/04/2014, 07/01/2020   Influenza-Unspecified 07/07/1999, 06/15/2021, 07/08/2021   Moderna Sars-Covid-2 Vaccination 06/15/2022   PFIZER(Purple Top)SARS-COV-2 Vaccination 11/19/2019, 12/12/2019   Td 09/04/2004, 02/17/2020   Tdap 05/01/2009   Unspecified SARS-COV-2 Vaccination 11/19/2019, 12/12/2019, 06/10/2020, 07/08/2021    Zoster Recombinat (Shingrix) 06/15/2022    Past Medical History:  Diagnosis Date   Allergic rhinitis    Anemia    Heavy menstrual flow/OTC FE BID:     Asthma    Family history of adverse reaction to anesthesia    Dad's heart stopped after surgery   Fibroids    GERD (gastroesophageal reflux disease)    Heart palpitations    History of kidney stones    Hyperlipidemia    Migraines    OSA (obstructive sleep apnea) 05/06/2014   Pneumonia    Rheumatoid arthritis ([place])     Tobacco History: Social History   Tobacco Use  Smoking Status Never   Passive exposure: Never  Smokeless Tobacco Former   Types: Snuff   Quit date: 2021   Counseling given: Not Answered   Outpatient Medications Prior to Visit  Medication Sig Dispense Refill   albuterol (VENTOLIN HFA) 108 (90 Base) MCG/ACT inhaler Inhale 2 puffs into the lungs every 6 (six) hours as needed for wheezing or shortness of breath. 6.7 g 0   B Complex-C (B-COMPLEX WITH VITAMIN C) tablet Take 1 tablet by mouth daily.     cetirizine (ZYRTEC) 10 MG tablet Take 10 mg by mouth daily.     Cholecalciferol (VITAMIN D) 125 MCG (5000 UT) CAPS Take 5,000 Units by mouth daily.     fluticasone (FLONASE) 50 MCG/ACT nasal spray Place 2 sprays into both nostrils daily. (Patient taking differently: Place 2 sprays into both nostrils daily as needed for allergies.) 16 g 6   folic acid (FOLVITE) 1 MG tablet Take 1 mg by mouth daily.     golimumab (SIMPONI ARIA) 50 MG/4ML SOLN injection Inject 50 mg into the vein every 8 (eight) weeks. Dose may change due to weight     methotrexate 2.5 MG tablet Take 20 mg by mouth once a week. Saturday (Taking 8 tabs weekly)     ondansetron (ZOFRAN-ODT) 4 MG disintegrating tablet Take 1 tablet (4 mg total) by mouth every 6 (six) hours as needed for nausea or vomiting. 20 tablet 0   pantoprazole (PROTONIX) 40 MG tablet Take 1 tablet (40 mg total) by mouth daily. Take this medication daily, regardless of reflux symptoms  90 tablet 0   rosuvastatin (CRESTOR) 5 MG tablet TAKE 1 TABLET(5 MG) BY MOUTH AT BEDTIME (Patient taking differently: Take 5 mg by mouth daily.) 90 tablet 3   SUMAtriptan (IMITREX) 100 MG tablet TAKE 1 TAB AT ONSET OF MIGRAINE REPEAT IN 2 HRS IF NEEDED 10 tablet 3   traMADol (ULTRAM) 50 MG tablet Take 1 tablet (50 mg total) by mouth every 6 (six) hours as needed (pain). 10 tablet 0   budesonide-formoterol (SYMBICORT) 160-4.5 MCG/ACT inhaler Inhale 2 puffs into the lungs in the morning and at bedtime. 10.2 g 5   gabapentin (NEURONTIN) 100 MG capsule Take 2 capsules (200 mg total) by mouth every 12 (twelve) hours. 20 capsule 0   No facility-administered medications prior to visit.     Review of Systems:   Constitutional: No  weight loss or gain, night sweats, fevers, chills, or lassitude. +fatigue (improving) HEENT: No headaches, difficulty swallowing, tooth/dental problems, or sore throat. No sneezing, itching, ear ache, nasal congestion, or post nasal drip CV:  No chest pain, orthopnea, PND, swelling in lower extremities, anasarca, dizziness, palpitations, syncope Resp: No increased shortness of breath with exertion or at rest. No excess mucus or change in color of mucus. No productive or non-productive. No hemoptysis. No wheezing.  No chest wall deformity MSK:  No joint pain or swelling.  No decreased range of motion.  No back pain. Neuro: No dizziness or lightheadedness.  Psych: No depression or anxiety. Mood stable.     Physical Exam:  BP 106/72   Pulse 74   Ht 5' 1.5" (1.562 m)   Wt 189 lb 6.4 oz (85.9 kg)   LMP 10/24/2014   SpO2 99%   BMI 35.21 kg/m   GEN: Pleasant, interactive, well-appearing; morbidly obese; in no acute distress. HEENT:  Normocephalic and atraumatic. PERRLA. Sclera white. Nasal turbinates pink, moist and patent bilaterally. No rhinorrhea present. Oropharynx pink and moist, without exudate or edema. No lesions, ulcerations, or postnasal drip. Mallampati  II NECK:  Supple w/ fair ROM. No JVD present. Normal carotid impulses w/o bruits. Thyroid symmetrical with no goiter or nodules palpated. No lymphadenopathy.   CV: RRR, no m/r/g, no peripheral edema. Pulses intact, +2 bilaterally. No cyanosis, pallor or clubbing. PULMONARY:  Unlabored, regular breathing. Clear bilaterally A&P w/o wheezes/rales/rhonchi. No accessory muscle use.  GI: BS present and normoactive. Soft, non-tender to palpation. No organomegaly or masses detected.  MSK: No erythema, warmth or tenderness. No deformities or joint swelling noted.  Neuro: A/Ox3. No focal deficits noted.   Skin: Warm, no lesions or rashe Psych: Normal affect and behavior. Judgement and thought content appropriate.     Lab Results:  CBC    Component Value Date/Time   WBC 8.2 07/18/2022 0414   RBC 4.83 07/18/2022 0414   HGB 14.8 07/18/2022 0414   HCT 45.8 07/18/2022 0414   PLT 318 07/18/2022 0414   MCV 94.8 07/18/2022 0414   MCH 30.6 07/18/2022 0414   MCHC 32.3 07/18/2022 0414   RDW 14.0 07/18/2022 0414   LYMPHSABS 1.2 07/18/2022 0414   MONOABS 0.7 07/18/2022 0414   EOSABS 0.0 07/18/2022 0414   BASOSABS 0.0 07/18/2022 0414    BMET    Component Value Date/Time   NA 139 07/18/2022 0414   K 4.1 07/18/2022 0414   CL 105 07/18/2022 0414   CO2 25 07/18/2022 0414   GLUCOSE 92 07/18/2022 0414   BUN 9 07/18/2022 0414   CREATININE 0.67 07/18/2022 0414   CALCIUM 8.8 (L) 07/18/2022 0414   GFRNONAA >60 07/18/2022 0414   GFRAA >60 11/14/2017 0127    BNP No results found for: "BNP"   Imaging:  No results found.       Latest Ref Rng & Units 09/08/2022    8:41 AM  PFT Results  FVC-Pre L 2.84  P  FVC-Predicted Pre % 89  P  FVC-Post L 2.94  P  FVC-Predicted Post % 92  P  Pre FEV1/FVC % % 81  P  Post FEV1/FCV % % 81  P  FEV1-Pre L 2.30  P  FEV1-Predicted Pre % 91  P  FEV1-Post L 2.38  P  DLCO uncorrected ml/min/mmHg 21.17  P  DLCO UNC% % 110  P  DLCO corrected ml/min/mmHg 20.35   P  DLCO COR %Predicted % 106  P  DLVA Predicted % 111  P  TLC L 4.57  P  TLC % Predicted % 97  P  RV % Predicted % 106  P    P Preliminary result    No results found for: "NITRICOXIDE"      Assessment & Plan:   OSA (obstructive sleep apnea) Good compliance and receives benefit from use. She has excellent control of events with use. I am going to tighten her parameters to 5-8 cmH2O based on average pressure 7.8 on download. She will work on using this nightly. Cautioned on safe driving practices. Healthy weight loss encouraged  Patient Instructions  Continue CPAP every night, minimum of 4-6 hours a night. Change settings to 5-8 cmH2O Change equipment every 30 days or as directed by DME. Wash your tubing with warm soap and water daily, hang to dry. Wash humidifier portion weekly.  Be aware of reduced alertness and do not drive or operate heavy machinery if experiencing this or drowsiness.  Exercise encouraged, as tolerated. Avoid or decrease alcohol consumption and medications that make you more sleepy, if possible. Notify if persistent daytime sleepiness occurs even with consistent use of CPAP.   Decrease Symbicort dose to 80 mcg - 2 puffs Twice daily. Brush tongue and rinse mouth afterwards. If you feel your breathing is stable over the next few months, you can decreased to 1 puff Twice daily  Continue Albuterol inhaler 2 puffs every 6 hours as needed for shortness of breath or wheezing. Notify if symptoms persist despite rescue inhaler/neb use.    Follow up in 6 months with Dr. Vaughan Browner or Alanson Aly. If symptoms do not improve or worsen, please contact office for sooner follow up or seek emergency care.    Asthma Stable and compensated on current regimen. We will de-escalate her to 80 mcg dose after she completes current inhaler. May consider further reduction to 1 puff Twice daily or PRN use. Asthma action plan in place  GERD (gastroesophageal reflux  disease) Well-controlled on current regimen    I spent 35 minutes of dedicated to the care of this patient on the date of this encounter to include pre-visit review of records, face-to-face time with the patient discussing conditions above, post visit ordering of testing, clinical documentation with the electronic health record, making appropriate referrals as documented, and communicating necessary findings to members of the patients care team.  Clayton Bibles, NP 09/08/2022  Pt aware and understands NP's role.

## 2022-09-12 ENCOUNTER — Encounter: Payer: Self-pay | Admitting: Dietician

## 2022-09-12 ENCOUNTER — Encounter: Payer: BC Managed Care – PPO | Attending: Adult Health | Admitting: Dietician

## 2022-09-12 VITALS — Ht 61.0 in | Wt 186.5 lb

## 2022-09-12 DIAGNOSIS — E669 Obesity, unspecified: Secondary | ICD-10-CM | POA: Diagnosis not present

## 2022-09-12 NOTE — Progress Notes (Signed)
Bariatric Nutrition Follow-Up Visit Medical Nutrition Therapy  Appt Start Time: 9:00   End Time: 9:27  Surgery date: Jul 28, 2022 Surgery type: Sleeve  NUTRITION ASSESSMENT  Anthropometrics  Start weight at [facility]: 221.3 lbs (date: 05/16/2022)  Height: 61 in Weight today: 186.5 lbs. BMI: 35.24 kg/m2     Clinical  Medical hx: Asthma, sleep apnea, obesity, GERD, anemia Medications: Methotrexate, omeprazole, folic acid, Symbicort, rosuvastatin, Simponi aria   Labs: glucose 103; lipid panel WNL Notable signs/symptoms: none noted Any previous deficiencies? No Bowel Habits: Every day to every other day no complaints   Body Composition Scale 08/01/22 09/12/2022  Current Body Weight 201.3 186.5  Total Body Fat % 43.0 41.1  Visceral Fat 15 13  Fat-Free Mass % 56.9 58.8   Total Body Water % 42.9 43.9  Muscle-Mass lbs 27.8 27.4  BMI 38.1 35.2  Body Fat Displacement           Torso  lbs 53.6 47.5         Left Leg  lbs 10.7 9.5         Right Leg  lbs 10.7 9.5         Left Arm  lbs 5.3 4.7         Right Arm   lbs 5.3 4.7     Lifestyle & Dietary Hx  Pt states her father died in a car wreck in July 29, 2023, so things have been tough right now, traveling back and forth to [place].  Pt states she likes sit ups and hand weights, stating she wants to start that and yoga. Pt state she has switched to sugar free everything.  Estimated daily fluid intake: up to 64 oz Estimated daily protein intake: 54-64 g Supplements: multivitamin and calcium Current average weekly physical activity: ADLs, walking the dogs about 40 minutes a day; riding bike.   24-Hr Dietary Recall First Meal: egg and re-fried beans Snack:   Second Meal: homemade chicken nuggets with chicken, egg and cheese or ricotta bake or fish Snack:  bought some cashews Third Meal: Protein shake or same as lunch or bean soup Snack:  Beverages: water, Gatorade zero, water with flavorings (half strength)  Post-Op Goals/ Signs/  Symptoms Using straws: no Drinking while eating: no Chewing/swallowing difficulties: no Changes in vision: no Changes to mood/headaches: no Hair loss/changes to skin/nails: no Difficulty focusing/concentrating: no Sweating: no Limb weakness: no Dizziness/lightheadedness: no Palpitations: no  Carbonated/caffeinated beverages: no N/V/D/C/Gas: no Abdominal pain: no Dumping syndrome: no    NUTRITION DIAGNOSIS  Overweight/obesity ([place]-3.3) related to past poor dietary habits and physical inactivity as evidenced by completed bariatric surgery and following dietary guidelines for continued weight loss and healthy nutrition status.     NUTRITION INTERVENTION Nutrition counseling (C-1) and education (E-2) to facilitate bariatric surgery goals, including: Diet advancement to the next phase (phase 4) now including non-starchy vegetables The importance of consuming adequate calories as well as certain nutrients daily due to the body's need for essential vitamins, minerals, and fats The importance of daily physical activity and to reach a goal of at least 150 minutes of moderate to vigorous physical activity weekly (or as directed by their physician) due to benefits such as increased musculature and improved lab values The importance of intuitive eating specifically learning hunger-satiety cues and understanding the importance of learning a new body: The importance of mindful eating to avoid grazing behaviors   Handouts Provided Include  Phase 4 Food Plan  Learning Style & Readiness for Change Teaching  method utilized: Optician, dispensing  Demonstrated degree of understanding via: Teach Back  Readiness Level: Action Barriers to learning/adherence to lifestyle change: nothing identified  RD's Notes for Next Visit Assess adherence to pt chosen goals  MONITORING & EVALUATION Dietary intake, weekly physical activity, body weight.  Next Steps Patient is to follow-up in 4 months for 6 month  post-op follow-up.

## 2022-09-26 ENCOUNTER — Encounter: Payer: Self-pay | Admitting: Adult Health

## 2022-09-26 ENCOUNTER — Ambulatory Visit: Payer: BC Managed Care – PPO | Admitting: Adult Health

## 2022-09-26 VITALS — BP 110/80 | HR 71 | Temp 97.7°F | Ht 61.0 in | Wt 183.0 lb

## 2022-09-26 DIAGNOSIS — J0100 Acute maxillary sinusitis, unspecified: Secondary | ICD-10-CM

## 2022-09-26 DIAGNOSIS — M7711 Lateral epicondylitis, right elbow: Secondary | ICD-10-CM

## 2022-09-26 DIAGNOSIS — F4321 Adjustment disorder with depressed mood: Secondary | ICD-10-CM | POA: Diagnosis not present

## 2022-09-26 MED ORDER — METHYLPREDNISOLONE 4 MG PO TBPK: ORAL_TABLET | ORAL | 0 refills | Status: DC

## 2022-09-26 MED ORDER — TRAZODONE HCL 50 MG PO TABS: 25.0000 mg | ORAL_TABLET | Freq: Every evening | ORAL | 0 refills | Status: DC | PRN

## 2022-09-26 MED ORDER — DOXYCYCLINE HYCLATE 100 MG PO CAPS: 100.0000 mg | ORAL_CAPSULE | Freq: Two times a day (BID) | ORAL | 0 refills | Status: DC

## 2022-09-26 NOTE — Progress Notes (Signed)
Subjective:    Patient ID: Katherine Rosario, female    DOB: 1970/12/18, 52 y.o.   MRN: [id number]  HPI 52 year old female who is being evaluated today for multiple issues  She reports that early in 09/12/2023 her father passed away tragically after being involved in an MVC.  She was in [place] for most of the month helping with the estate and cleaning out his house.  Since his passing she has not been able to fully grieve and due to the stress is having trouble with sleeping, both falling asleep and staying asleep.  She also believes that she may have injured her right elbow and forearm with moving belongings.  She reports that over the last week or so she has pain from her right elbow that radiates down her forearm causing pain decreased grip strength.  There are more for over the last week she has also had nasal congestion, rhinorrhea, sinus pain and pressure, sinus headache, and feeling acutely ill.  She has not had any fevers or chills  Review of Systems See HPI   Past Medical History:  Diagnosis Date   Allergic rhinitis    Anemia    Heavy menstrual flow/OTC FE BID:     Asthma    Family history of adverse reaction to anesthesia    Dad's heart stopped after surgery   Fibroids    GERD (gastroesophageal reflux disease)    Heart palpitations    History of kidney stones    Hyperlipidemia    Migraines    OSA (obstructive sleep apnea) 05/06/2014   Pneumonia    Rheumatoid arthritis ([place])     Social History   Socioeconomic History   Marital status: Divorced    Spouse name: Not on file   Number of children: Not on file   Years of education: Not on file   Highest education level: Some college, no degree  Occupational History   Occupation: Medical illustrator: [employer]  Tobacco Use   Smoking status: Never    Passive exposure: Never   Smokeless tobacco: Former    Types: Snuff    Quit date: 2021  Vaping Use   Vaping Use: Never used  Substance  and Sexual Activity   Alcohol use: Yes    Alcohol/week: 0.0 standard drinks of alcohol    Comment: occ   Drug use: No   Sexual activity: Never  Other Topics Concern   Not on file  Social History Narrative   married to female partner [name] 12/2012, 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      Pt lives with wife, in a one story home. Has some college education.       She likes to hike and swim.    Social Determinants of Health   Financial Resource Strain: Medium Risk (12/26/2021)   Overall Financial Resource Strain (CARDIA)    Difficulty of Paying Living Expenses: Somewhat hard  Food Insecurity: No Food Insecurity (07/17/2022)   Hunger Vital Sign    Worried About Running Out of Food in the Last Year: Never true    Ran Out of Food in the Last Year: Never true  Transportation Needs: No Transportation Needs (07/17/2022)   PRAPARE - Hydrologist (Medical): No    Lack of Transportation (Non-Medical): No  Physical Activity: Insufficiently Active (12/26/2021)   Exercise Vital Sign    Days of Exercise per Week: 2 days    Minutes of Exercise per Session:  30 min  Stress: Stress Concern Present (12/26/2021)   [place]    Feeling of Stress : Very much  Social Connections: Socially Isolated (12/26/2021)   Social Connection and Isolation Panel [NHANES]    Frequency of Communication with Friends and Family: Once a week    Frequency of Social Gatherings with Friends and Family: Once a week    Attends Religious Services: Never    Marine scientist or Organizations: No    Attends Music therapist: Not on file    Marital Status: Divorced  Intimate Partner Violence: Not At Risk (07/17/2022)   Humiliation, Afraid, Rape, and Kick questionnaire    Fear of Current or Ex-Partner: No    Emotionally Abused: No    Physically Abused: No    Sexually Abused: No    Past Surgical History:  Procedure  Laterality Date   LAPAROSCOPIC GASTRIC SLEEVE RESECTION N/A 07/17/2022   Procedure: LAPAROSCOPIC SLEEVE GASTRECTOMY;  Surgeon: Clovis Riley, MD;  Location: [facility];  Service: General;  Laterality: N/A;   UPPER GI ENDOSCOPY N/A 07/17/2022   Procedure: UPPER GI ENDOSCOPY;  Surgeon: Clovis Riley, MD;  Location: [facility];  Service: General;  Laterality: N/A;   UTERINE FIBROID EMBOLIZATION     WISDOM TOOTH EXTRACTION      Family History  Problem Relation Age of Onset   Hyperlipidemia Mother    Hypertension Mother    Colon cancer Father 59   Hyperlipidemia Father    Hypertension Father    Prostate cancer Father 45   Heart attack Neg Hx    Stroke Neg Hx     No Known Allergies  Current Outpatient Medications on File Prior to Visit  Medication Sig Dispense Refill   albuterol (VENTOLIN HFA) 108 (90 Base) MCG/ACT inhaler Inhale 2 puffs into the lungs every 6 (six) hours as needed for wheezing or shortness of breath. 6.7 g 0   B Complex-C (B-COMPLEX WITH VITAMIN C) tablet Take 1 tablet by mouth daily.     budesonide-formoterol (SYMBICORT) 80-4.5 MCG/ACT inhaler Inhale 2 puffs into the lungs in the morning and at bedtime. 1 each 5   cetirizine (ZYRTEC) 10 MG tablet Take 10 mg by mouth daily.     Cholecalciferol (VITAMIN D) 125 MCG (5000 UT) CAPS Take 5,000 Units by mouth daily.     fluticasone (FLONASE) 50 MCG/ACT nasal spray Place 2 sprays into both nostrils daily. (Patient taking differently: Place 2 sprays into both nostrils daily as needed for allergies.) 16 g 6   folic acid (FOLVITE) 1 MG tablet Take 1 mg by mouth daily.     golimumab (SIMPONI ARIA) 50 MG/4ML SOLN injection Inject 50 mg into the vein every 8 (eight) weeks. Dose may change due to weight     methotrexate 2.5 MG tablet Take 20 mg by mouth once a week. Saturday (Taking 8 tabs weekly)     ondansetron (ZOFRAN-ODT) 4 MG disintegrating tablet Take 1 tablet (4 mg total) by mouth every 6 (six) hours as needed for nausea or  vomiting. 20 tablet 0   pantoprazole (PROTONIX) 40 MG tablet Take 1 tablet (40 mg total) by mouth daily. Take this medication daily, regardless of reflux symptoms 90 tablet 0   rosuvastatin (CRESTOR) 5 MG tablet TAKE 1 TABLET(5 MG) BY MOUTH AT BEDTIME (Patient taking differently: Take 5 mg by mouth daily.) 90 tablet 3   SUMAtriptan (IMITREX) 100 MG tablet TAKE 1 TAB AT  ONSET OF MIGRAINE REPEAT IN 2 HRS IF NEEDED 10 tablet 3   No current facility-administered medications on file prior to visit.    BP 110/80   Pulse 71   Temp 97.7 F (36.5 C) (Oral)   Ht '5\' 1"'$  (1.549 m)   Wt 183 lb (83 kg)   LMP 10/24/2014   SpO2 98%   BMI 34.58 kg/m       Objective:   Physical Exam Vitals and nursing note reviewed.  Constitutional:      Appearance: Normal appearance.  HENT:     Nose: Congestion and rhinorrhea present. Rhinorrhea is purulent.     Right Turbinates: Enlarged and swollen.     Left Turbinates: Enlarged.     Right Sinus: Maxillary sinus tenderness present.     Left Sinus: Maxillary sinus tenderness present.  Cardiovascular:     Rate and Rhythm: Normal rate and regular rhythm.     Pulses: Normal pulses.     Heart sounds: Normal heart sounds.  Pulmonary:     Effort: Pulmonary effort is normal.     Breath sounds: Normal breath sounds.  Musculoskeletal:     Right elbow: No swelling. Decreased range of motion. Tenderness present in lateral epicondyle. No radial head or olecranon process tenderness.  Skin:    General: Skin is dry.  Neurological:     General: No focal deficit present.     Mental Status: She is alert and oriented to person, place, and time.  Psychiatric:        Mood and Affect: Mood normal. Affect is tearful.        Behavior: Behavior normal.        Thought Content: Thought content normal.       Assessment & Plan:   1. Grief reaction - Will send in Trazodone 50 mgQHS for depression and insomnia - she can take his PRN  - Follow up if symptoms worsen    2.  Acute non-recurrent maxillary sinusitis  - doxycycline (VIBRAMYCIN) 100 MG capsule; Take 1 capsule (100 mg total) by mouth 2 (two) times daily.  Dispense: 14 capsule; Refill: 0 - methylPREDNISolone (MEDROL DOSEPAK) 4 MG TBPK tablet; Take as directed  Dispense: 21 tablet; Refill: 0  3. Lateral epicondylitis of right elbow - advised conservative measures such a heat/ice, Nsaids, and rest. She can also try a tennis elbow brace. Follow up if not resolved in the next 5-6 weeks at which time would consider referral to PT and/or sports medicine  - methylPREDNISolone (MEDROL DOSEPAK) 4 MG TBPK tablet; Take as directed  Dispense: 21 tablet; Refill: 0   Dorothyann Peng, NP

## 2022-09-27 ENCOUNTER — Encounter: Payer: Self-pay | Admitting: Adult Health

## 2022-09-27 NOTE — Telephone Encounter (Signed)
Note sent to pt via Mychart.

## 2022-09-27 NOTE — Telephone Encounter (Signed)
Please advise 

## 2022-10-03 DIAGNOSIS — G4733 Obstructive sleep apnea (adult) (pediatric): Secondary | ICD-10-CM | POA: Diagnosis not present

## 2022-10-06 DIAGNOSIS — G4733 Obstructive sleep apnea (adult) (pediatric): Secondary | ICD-10-CM | POA: Diagnosis not present

## 2022-10-13 DIAGNOSIS — M0609 Rheumatoid arthritis without rheumatoid factor, multiple sites: Secondary | ICD-10-CM | POA: Diagnosis not present

## 2022-10-30 ENCOUNTER — Encounter: Payer: Self-pay | Admitting: Adult Health

## 2022-10-31 NOTE — Telephone Encounter (Signed)
Please advise 

## 2022-11-01 DIAGNOSIS — M1990 Unspecified osteoarthritis, unspecified site: Secondary | ICD-10-CM | POA: Diagnosis not present

## 2022-11-01 DIAGNOSIS — E669 Obesity, unspecified: Secondary | ICD-10-CM | POA: Diagnosis not present

## 2022-11-01 DIAGNOSIS — M0609 Rheumatoid arthritis without rheumatoid factor, multiple sites: Secondary | ICD-10-CM | POA: Diagnosis not present

## 2022-11-01 DIAGNOSIS — Z79899 Other long term (current) drug therapy: Secondary | ICD-10-CM | POA: Diagnosis not present

## 2022-11-02 DIAGNOSIS — Z9884 Bariatric surgery status: Secondary | ICD-10-CM | POA: Diagnosis not present

## 2022-11-03 ENCOUNTER — Telehealth: Payer: Self-pay | Admitting: Adult Health

## 2022-11-03 NOTE — Telephone Encounter (Signed)
FMLA paperwork to be filled out -- placed in dr's folder.  Call [phone] upon completion.

## 2022-11-03 NOTE — Telephone Encounter (Signed)
Noted  

## 2022-11-07 ENCOUNTER — Encounter: Payer: Self-pay | Admitting: Adult Health

## 2022-11-28 ENCOUNTER — Encounter: Payer: Self-pay | Admitting: Adult Health

## 2022-11-28 DIAGNOSIS — J0101 Acute recurrent maxillary sinusitis: Secondary | ICD-10-CM

## 2022-11-28 DIAGNOSIS — H6692 Otitis media, unspecified, left ear: Secondary | ICD-10-CM

## 2022-11-29 MED ORDER — FLUTICASONE PROPIONATE 50 MCG/ACT NA SUSP: 2.0000 | Freq: Every day | NASAL | 6 refills | Status: AC

## 2022-12-08 DIAGNOSIS — M0609 Rheumatoid arthritis without rheumatoid factor, multiple sites: Secondary | ICD-10-CM | POA: Diagnosis not present

## 2022-12-12 ENCOUNTER — Encounter: Payer: Self-pay | Admitting: Dietician

## 2022-12-12 ENCOUNTER — Encounter: Payer: BC Managed Care – PPO | Attending: Adult Health | Admitting: Dietician

## 2022-12-12 VITALS — Ht 61.0 in | Wt 161.8 lb

## 2022-12-12 DIAGNOSIS — E669 Obesity, unspecified: Secondary | ICD-10-CM | POA: Insufficient documentation

## 2022-12-12 NOTE — Progress Notes (Signed)
Bariatric Nutrition Follow-Up Visit Medical Nutrition Therapy  Appt Start Time: 8:50  End Time: 9:23  Surgery date: 07/17/22 Surgery type: Sleeve  NUTRITION ASSESSMENT  Anthropometrics  Start weight at [facility]: 221.3 lbs (date: 05/16/2022)  Height: 61 in Weight today: 161.8 lbs. BMI: 30.57 kg/m2     Clinical  Medical hx: Asthma, sleep apnea, obesity, GERD, anemia Medications: Methotrexate, omeprazole, folic acid, Symbicort, rosuvastatin, Simponi aria   Labs: glucose 103; lipid panel [facility] Notable signs/symptoms: none noted Any previous deficiencies? No Bowel Habits: Every day to every other day no complaints   Body Composition Scale 08/01/22 09/12/2022 12/12/2022  Current Body Weight 201.3 186.5 161.8  Total Body Fat % 43.0 41.1 36.7  Visceral Fat 15 13 10   Fat-Free Mass % 56.9 58.8 63.2   Total Body Water % 42.9 43.9 46.1  Muscle-Mass lbs 27.8 27.4 27.3  BMI 38.1 35.2 30.5  Body Fat Displacement            Torso  lbs 53.6 47.5 36.7         Left Leg  lbs 10.7 9.5 7.3         Right Leg  lbs 10.7 9.5 7.3         Left Arm  lbs 5.3 4.7 3.6         Right Arm   lbs 5.3 4.7 3.6     Lifestyle & Dietary Hx  Pt states she will drink protein infused water/fluid. Pt states she walks her dogs for 40 minutes a day, stating each one for 20 minutes.  Pt states one of her dogs she gets a more intense walk. Pt is doing great. Pt states she is fine returning in 6 months for her year follow-up. Pt agreeable to call if she needs a sooner appointment.  Estimated daily fluid intake: 64+ oz Estimated daily protein intake: 67-87 g Supplements: multivitamin and calcium Current average weekly physical activity: ADLs, walking the dogs about 40 minutes a day; riding bike 30 minutes every other day.   24-Hr Dietary Recall First Meal: spinach and feta egg bites with tomatoes and bacon bits Snack:   Second Meal: left overs (chicken breast and spinach) Snack:  bought some cashews or [employer]  chocolate protein shake Third Meal: Chicken breast or cauliflower crust spinach pizza. Snack:  Beverages: water, Gatorade zero, water with flavorings (half strength)  Post-Op Goals/ Signs/ Symptoms Using straws: no Drinking while eating: no Chewing/swallowing difficulties: no Changes in vision: no Changes to mood/headaches: no Hair loss/changes to skin/nails: no Difficulty focusing/concentrating: no Sweating: no Limb weakness: no Dizziness/lightheadedness: no Palpitations: no  Carbonated/caffeinated beverages: no N/V/D/C/Gas: no Abdominal pain: no Dumping syndrome: no    NUTRITION DIAGNOSIS  Overweight/obesity ([place]-3.3) related to past poor dietary habits and physical inactivity as evidenced by completed bariatric surgery and following dietary guidelines for continued weight loss and healthy nutrition status.     NUTRITION INTERVENTION Nutrition counseling (C-1) and education (E-2) to facilitate bariatric surgery goals, including: The importance of consuming adequate calories as well as certain nutrients daily due to the body's need for essential vitamins, minerals, and fats The importance of daily physical activity and to reach a goal of at least 150 minutes of moderate to vigorous physical activity weekly (or as directed by their physician) due to benefits such as increased musculature and improved lab values The importance of intuitive eating specifically learning hunger-satiety cues and understanding the importance of learning a new body: The importance of mindful eating to avoid grazing  behaviors  Encouraged patient to honor their body's internal hunger and fullness cues.  Throughout the day, check in mentally and rate hunger. Stop eating when satisfied not full regardless of how much food is left on the plate.  Get more if still hungry 20-30 minutes later.  The key is to honor satisfaction so throughout the meal, rate fullness factor and stop when comfortably satisfied not  physically full. The key is to honor hunger and fullness without any feelings of guilt or shame.  Pay attention to what the internal cues are, rather than any external factors. This will enhance the confidence you have in listening to your own body and following those internal cues enabling you to increase how often you eat when you are hungry not out of appetite and stop when you are satisfied not full.  Encouraged pt to continue to eat balanced meals inclusive of non starchy vegetables 2 times a day 7 days a week Encouraged pt to choose lean protein sources: limiting beef, pork, sausage, hotdogs, and lunch meat Encourage pt to choose healthy fats such as plant based limiting animal fats Encouraged pt to continue to drink a minium 64 fluid ounces with half being plain water to satisfy proper hydration    Goals -add resistance exercises to your physical activity.  Handouts Provided Include  Maintenance Healthy Eating Guide (6 Months to Maintenance)  Learning Style & Readiness for Change Teaching method utilized: Visual & Auditory  Demonstrated degree of understanding via: Teach Back  Readiness Level: Action Barriers to learning/adherence to lifestyle change: nothing identified   RD's Notes for Next Visit Assess adherence to pt chosen goals  MONITORING & EVALUATION Dietary intake, weekly physical activity, body weight.  Next Steps Patient is to follow-up in 6 months for 1 year post-op follow-up.

## 2023-01-25 DIAGNOSIS — Z9884 Bariatric surgery status: Secondary | ICD-10-CM | POA: Diagnosis not present

## 2023-02-08 DIAGNOSIS — M0609 Rheumatoid arthritis without rheumatoid factor, multiple sites: Secondary | ICD-10-CM | POA: Diagnosis not present

## 2023-02-22 ENCOUNTER — Encounter: Payer: Self-pay | Admitting: Nurse Practitioner

## 2023-02-27 DIAGNOSIS — M9902 Segmental and somatic dysfunction of thoracic region: Secondary | ICD-10-CM | POA: Diagnosis not present

## 2023-02-27 DIAGNOSIS — M530 Cervicocranial syndrome: Secondary | ICD-10-CM | POA: Diagnosis not present

## 2023-02-27 DIAGNOSIS — M62830 Muscle spasm of back: Secondary | ICD-10-CM | POA: Diagnosis not present

## 2023-02-27 DIAGNOSIS — M9901 Segmental and somatic dysfunction of cervical region: Secondary | ICD-10-CM | POA: Diagnosis not present

## 2023-02-28 DIAGNOSIS — M530 Cervicocranial syndrome: Secondary | ICD-10-CM | POA: Diagnosis not present

## 2023-02-28 DIAGNOSIS — M62830 Muscle spasm of back: Secondary | ICD-10-CM | POA: Diagnosis not present

## 2023-02-28 DIAGNOSIS — M9901 Segmental and somatic dysfunction of cervical region: Secondary | ICD-10-CM | POA: Diagnosis not present

## 2023-02-28 DIAGNOSIS — M9902 Segmental and somatic dysfunction of thoracic region: Secondary | ICD-10-CM | POA: Diagnosis not present

## 2023-03-01 DIAGNOSIS — R768 Other specified abnormal immunological findings in serum: Secondary | ICD-10-CM | POA: Diagnosis not present

## 2023-03-01 DIAGNOSIS — M1990 Unspecified osteoarthritis, unspecified site: Secondary | ICD-10-CM | POA: Diagnosis not present

## 2023-03-01 DIAGNOSIS — Z79899 Other long term (current) drug therapy: Secondary | ICD-10-CM | POA: Diagnosis not present

## 2023-03-01 DIAGNOSIS — M0609 Rheumatoid arthritis without rheumatoid factor, multiple sites: Secondary | ICD-10-CM | POA: Diagnosis not present

## 2023-03-06 DIAGNOSIS — M9901 Segmental and somatic dysfunction of cervical region: Secondary | ICD-10-CM | POA: Diagnosis not present

## 2023-03-06 DIAGNOSIS — M9902 Segmental and somatic dysfunction of thoracic region: Secondary | ICD-10-CM | POA: Diagnosis not present

## 2023-03-06 DIAGNOSIS — M62830 Muscle spasm of back: Secondary | ICD-10-CM | POA: Diagnosis not present

## 2023-03-06 DIAGNOSIS — M530 Cervicocranial syndrome: Secondary | ICD-10-CM | POA: Diagnosis not present

## 2023-03-07 DIAGNOSIS — M9902 Segmental and somatic dysfunction of thoracic region: Secondary | ICD-10-CM | POA: Diagnosis not present

## 2023-03-07 DIAGNOSIS — M9901 Segmental and somatic dysfunction of cervical region: Secondary | ICD-10-CM | POA: Diagnosis not present

## 2023-03-07 DIAGNOSIS — M62830 Muscle spasm of back: Secondary | ICD-10-CM | POA: Diagnosis not present

## 2023-03-07 DIAGNOSIS — M530 Cervicocranial syndrome: Secondary | ICD-10-CM | POA: Diagnosis not present

## 2023-03-12 ENCOUNTER — Other Ambulatory Visit: Payer: Self-pay | Admitting: Obstetrics and Gynecology

## 2023-03-12 ENCOUNTER — Ambulatory Visit: Payer: BC Managed Care – PPO | Admitting: Nurse Practitioner

## 2023-03-12 ENCOUNTER — Encounter: Payer: Self-pay | Admitting: Adult Health

## 2023-03-12 DIAGNOSIS — Z1231 Encounter for screening mammogram for malignant neoplasm of breast: Secondary | ICD-10-CM

## 2023-03-13 DIAGNOSIS — M9901 Segmental and somatic dysfunction of cervical region: Secondary | ICD-10-CM | POA: Diagnosis not present

## 2023-03-13 DIAGNOSIS — M62830 Muscle spasm of back: Secondary | ICD-10-CM | POA: Diagnosis not present

## 2023-03-13 DIAGNOSIS — M9902 Segmental and somatic dysfunction of thoracic region: Secondary | ICD-10-CM | POA: Diagnosis not present

## 2023-03-13 DIAGNOSIS — M530 Cervicocranial syndrome: Secondary | ICD-10-CM | POA: Diagnosis not present

## 2023-03-13 NOTE — Telephone Encounter (Signed)
Called pt to get more information. Pt stated she is wanting labs to check "everything. Pt advised that this is done at CPE is she is not due until 04/06/2023. Was set up to schedule pt but pt stated she can only come after 3:30 or around 3:45. Advised pt that the latest we could do would be 2:30pm. Pt declined. Will route to PCP for further advise.

## 2023-03-14 DIAGNOSIS — M9902 Segmental and somatic dysfunction of thoracic region: Secondary | ICD-10-CM | POA: Diagnosis not present

## 2023-03-14 DIAGNOSIS — M9901 Segmental and somatic dysfunction of cervical region: Secondary | ICD-10-CM | POA: Diagnosis not present

## 2023-03-14 DIAGNOSIS — M62830 Muscle spasm of back: Secondary | ICD-10-CM | POA: Diagnosis not present

## 2023-03-14 DIAGNOSIS — M530 Cervicocranial syndrome: Secondary | ICD-10-CM | POA: Diagnosis not present

## 2023-03-14 NOTE — Telephone Encounter (Signed)
Pt notified of Cory's message below. Pt will all back and schedule an appt.

## 2023-03-15 ENCOUNTER — Ambulatory Visit
Admission: RE | Admit: 2023-03-15 | Discharge: 2023-03-15 | Disposition: A | Discharge status: Home / Self Care | Payer: BC Managed Care – PPO | Source: Ambulatory Visit | Attending: Obstetrics and Gynecology | Admitting: Obstetrics and Gynecology

## 2023-03-15 DIAGNOSIS — Z1231 Encounter for screening mammogram for malignant neoplasm of breast: Secondary | ICD-10-CM

## 2023-03-16 DIAGNOSIS — M62830 Muscle spasm of back: Secondary | ICD-10-CM | POA: Diagnosis not present

## 2023-03-16 DIAGNOSIS — M9901 Segmental and somatic dysfunction of cervical region: Secondary | ICD-10-CM | POA: Diagnosis not present

## 2023-03-16 DIAGNOSIS — M530 Cervicocranial syndrome: Secondary | ICD-10-CM | POA: Diagnosis not present

## 2023-03-16 DIAGNOSIS — M9902 Segmental and somatic dysfunction of thoracic region: Secondary | ICD-10-CM | POA: Diagnosis not present

## 2023-03-20 DIAGNOSIS — M9901 Segmental and somatic dysfunction of cervical region: Secondary | ICD-10-CM | POA: Diagnosis not present

## 2023-03-20 DIAGNOSIS — M62830 Muscle spasm of back: Secondary | ICD-10-CM | POA: Diagnosis not present

## 2023-03-20 DIAGNOSIS — M530 Cervicocranial syndrome: Secondary | ICD-10-CM | POA: Diagnosis not present

## 2023-03-20 DIAGNOSIS — M9902 Segmental and somatic dysfunction of thoracic region: Secondary | ICD-10-CM | POA: Diagnosis not present

## 2023-03-22 DIAGNOSIS — M9901 Segmental and somatic dysfunction of cervical region: Secondary | ICD-10-CM | POA: Diagnosis not present

## 2023-03-22 DIAGNOSIS — M530 Cervicocranial syndrome: Secondary | ICD-10-CM | POA: Diagnosis not present

## 2023-03-22 DIAGNOSIS — M62830 Muscle spasm of back: Secondary | ICD-10-CM | POA: Diagnosis not present

## 2023-03-22 DIAGNOSIS — M9902 Segmental and somatic dysfunction of thoracic region: Secondary | ICD-10-CM | POA: Diagnosis not present

## 2023-03-27 DIAGNOSIS — M9902 Segmental and somatic dysfunction of thoracic region: Secondary | ICD-10-CM | POA: Diagnosis not present

## 2023-03-27 DIAGNOSIS — M9901 Segmental and somatic dysfunction of cervical region: Secondary | ICD-10-CM | POA: Diagnosis not present

## 2023-03-27 DIAGNOSIS — M530 Cervicocranial syndrome: Secondary | ICD-10-CM | POA: Diagnosis not present

## 2023-03-27 DIAGNOSIS — M62830 Muscle spasm of back: Secondary | ICD-10-CM | POA: Diagnosis not present

## 2023-03-29 DIAGNOSIS — M9901 Segmental and somatic dysfunction of cervical region: Secondary | ICD-10-CM | POA: Diagnosis not present

## 2023-03-29 DIAGNOSIS — M9902 Segmental and somatic dysfunction of thoracic region: Secondary | ICD-10-CM | POA: Diagnosis not present

## 2023-03-29 DIAGNOSIS — M530 Cervicocranial syndrome: Secondary | ICD-10-CM | POA: Diagnosis not present

## 2023-03-29 DIAGNOSIS — M62830 Muscle spasm of back: Secondary | ICD-10-CM | POA: Diagnosis not present

## 2023-04-03 DIAGNOSIS — M62830 Muscle spasm of back: Secondary | ICD-10-CM | POA: Diagnosis not present

## 2023-04-03 DIAGNOSIS — M530 Cervicocranial syndrome: Secondary | ICD-10-CM | POA: Diagnosis not present

## 2023-04-03 DIAGNOSIS — M9901 Segmental and somatic dysfunction of cervical region: Secondary | ICD-10-CM | POA: Diagnosis not present

## 2023-04-03 DIAGNOSIS — M9902 Segmental and somatic dysfunction of thoracic region: Secondary | ICD-10-CM | POA: Diagnosis not present

## 2023-04-04 ENCOUNTER — Encounter (INDEPENDENT_AMBULATORY_CARE_PROVIDER_SITE_OTHER): Payer: Self-pay

## 2023-04-05 ENCOUNTER — Ambulatory Visit: Payer: BC Managed Care – PPO | Admitting: Nurse Practitioner

## 2023-04-05 DIAGNOSIS — M0609 Rheumatoid arthritis without rheumatoid factor, multiple sites: Secondary | ICD-10-CM | POA: Diagnosis not present

## 2023-04-06 DIAGNOSIS — M530 Cervicocranial syndrome: Secondary | ICD-10-CM | POA: Diagnosis not present

## 2023-04-06 DIAGNOSIS — M9901 Segmental and somatic dysfunction of cervical region: Secondary | ICD-10-CM | POA: Diagnosis not present

## 2023-04-06 DIAGNOSIS — M9902 Segmental and somatic dysfunction of thoracic region: Secondary | ICD-10-CM | POA: Diagnosis not present

## 2023-04-06 DIAGNOSIS — M62830 Muscle spasm of back: Secondary | ICD-10-CM | POA: Diagnosis not present

## 2023-04-10 DIAGNOSIS — M530 Cervicocranial syndrome: Secondary | ICD-10-CM | POA: Diagnosis not present

## 2023-04-10 DIAGNOSIS — M9902 Segmental and somatic dysfunction of thoracic region: Secondary | ICD-10-CM | POA: Diagnosis not present

## 2023-04-10 DIAGNOSIS — M62830 Muscle spasm of back: Secondary | ICD-10-CM | POA: Diagnosis not present

## 2023-04-10 DIAGNOSIS — M9901 Segmental and somatic dysfunction of cervical region: Secondary | ICD-10-CM | POA: Diagnosis not present

## 2023-04-13 DIAGNOSIS — M530 Cervicocranial syndrome: Secondary | ICD-10-CM | POA: Diagnosis not present

## 2023-04-13 DIAGNOSIS — M9901 Segmental and somatic dysfunction of cervical region: Secondary | ICD-10-CM | POA: Diagnosis not present

## 2023-04-13 DIAGNOSIS — M62830 Muscle spasm of back: Secondary | ICD-10-CM | POA: Diagnosis not present

## 2023-04-13 DIAGNOSIS — M9902 Segmental and somatic dysfunction of thoracic region: Secondary | ICD-10-CM | POA: Diagnosis not present

## 2023-04-17 DIAGNOSIS — M62830 Muscle spasm of back: Secondary | ICD-10-CM | POA: Diagnosis not present

## 2023-04-17 DIAGNOSIS — M9902 Segmental and somatic dysfunction of thoracic region: Secondary | ICD-10-CM | POA: Diagnosis not present

## 2023-04-17 DIAGNOSIS — M530 Cervicocranial syndrome: Secondary | ICD-10-CM | POA: Diagnosis not present

## 2023-04-17 DIAGNOSIS — M9901 Segmental and somatic dysfunction of cervical region: Secondary | ICD-10-CM | POA: Diagnosis not present

## 2023-04-18 ENCOUNTER — Ambulatory Visit: Payer: BC Managed Care – PPO | Admitting: Nurse Practitioner

## 2023-04-20 ENCOUNTER — Ambulatory Visit (INDEPENDENT_AMBULATORY_CARE_PROVIDER_SITE_OTHER): Payer: BC Managed Care – PPO | Admitting: Adult Health

## 2023-04-20 ENCOUNTER — Encounter: Payer: Self-pay | Admitting: Adult Health

## 2023-04-20 VITALS — BP 118/70 | HR 66 | Temp 97.9°F | Ht 61.0 in | Wt 139.2 lb

## 2023-04-20 DIAGNOSIS — E782 Mixed hyperlipidemia: Secondary | ICD-10-CM

## 2023-04-20 DIAGNOSIS — M9902 Segmental and somatic dysfunction of thoracic region: Secondary | ICD-10-CM | POA: Diagnosis not present

## 2023-04-20 DIAGNOSIS — Z0000 Encounter for general adult medical examination without abnormal findings: Secondary | ICD-10-CM

## 2023-04-20 DIAGNOSIS — Z8669 Personal history of other diseases of the nervous system and sense organs: Secondary | ICD-10-CM | POA: Diagnosis not present

## 2023-04-20 DIAGNOSIS — M530 Cervicocranial syndrome: Secondary | ICD-10-CM | POA: Diagnosis not present

## 2023-04-20 DIAGNOSIS — M069 Rheumatoid arthritis, unspecified: Secondary | ICD-10-CM

## 2023-04-20 DIAGNOSIS — Z903 Acquired absence of stomach [part of]: Secondary | ICD-10-CM

## 2023-04-20 DIAGNOSIS — J4541 Moderate persistent asthma with (acute) exacerbation: Secondary | ICD-10-CM | POA: Diagnosis not present

## 2023-04-20 DIAGNOSIS — M9901 Segmental and somatic dysfunction of cervical region: Secondary | ICD-10-CM | POA: Diagnosis not present

## 2023-04-20 DIAGNOSIS — M62830 Muscle spasm of back: Secondary | ICD-10-CM | POA: Diagnosis not present

## 2023-04-20 DIAGNOSIS — F5101 Primary insomnia: Secondary | ICD-10-CM

## 2023-04-20 DIAGNOSIS — G4733 Obstructive sleep apnea (adult) (pediatric): Secondary | ICD-10-CM

## 2023-04-20 LAB — LIPID PANEL
Cholesterol: 157 mg/dL (ref 0–200)
HDL: 57.3 mg/dL (ref >39.00)
LDL Cholesterol: 87 mg/dL (ref 0–99)
NonHDL: 99.44
Total CHOL/HDL Ratio: 3
Triglycerides: 63 mg/dL (ref 0.0–149.0)
VLDL: 12.6 mg/dL (ref 0.0–40.0)

## 2023-04-20 LAB — COMPREHENSIVE METABOLIC PANEL
ALT: 15 U/L (ref 0–35)
AST: 20 U/L (ref 0–37)
Albumin: 4.4 g/dL (ref 3.5–5.2)
Alkaline Phosphatase: 69 U/L (ref 39–117)
BUN: 17 mg/dL (ref 6–23)
CO2: 29 mEq/L (ref 19–32)
Calcium: 9.7 mg/dL (ref 8.4–10.5)
Chloride: 100 mEq/L (ref 96–112)
Creatinine, Ser: 0.76 mg/dL (ref 0.40–1.20)
GFR: 88.74 mL/min (ref >60.00)
Glucose, Bld: 77 mg/dL (ref 70–99)
Potassium: 3.7 mEq/L (ref 3.5–5.1)
Sodium: 137 mEq/L (ref 135–145)
Total Bilirubin: 0.5 mg/dL (ref 0.2–1.2)
Total Protein: 7.1 g/dL (ref 6.0–8.3)

## 2023-04-20 LAB — CBC WITH DIFFERENTIAL/PLATELET
Basophils Absolute: 0.1 10*3/uL (ref 0.0–0.1)
Basophils Relative: 0.6 % (ref 0.0–3.0)
Eosinophils Absolute: 0.3 10*3/uL (ref 0.0–0.7)
Eosinophils Relative: 2.8 % (ref 0.0–5.0)
HCT: 41.4 % (ref 36.0–46.0)
Hemoglobin: 13.5 g/dL (ref 12.0–15.0)
Lymphocytes Relative: 43.4 % (ref 12.0–46.0)
Lymphs Abs: 4.6 10*3/uL — ABNORMAL HIGH (ref 0.7–4.0)
MCHC: 32.6 g/dL (ref 30.0–36.0)
MCV: 96.6 fl (ref 78.0–100.0)
Monocytes Absolute: 0.7 10*3/uL (ref 0.1–1.0)
Monocytes Relative: 6.6 % (ref 3.0–12.0)
Neutro Abs: 5 10*3/uL (ref 1.4–7.7)
Neutrophils Relative %: 46.6 % (ref 43.0–77.0)
Platelets: 318 10*3/uL (ref 150.0–400.0)
RBC: 4.28 Mil/uL (ref 3.87–5.11)
RDW: 13.4 % (ref 11.5–15.5)
WBC: 10.6 10*3/uL — ABNORMAL HIGH (ref 4.0–10.5)

## 2023-04-20 LAB — TSH: TSH: 2.22 u[IU]/mL (ref 0.35–5.50)

## 2023-04-20 MED ORDER — SUMATRIPTAN SUCCINATE 100 MG PO TABS
ORAL_TABLET | ORAL | 3 refills | Status: DC
Start: 2023-04-20 — End: 2023-09-11

## 2023-04-20 NOTE — Progress Notes (Signed)
Subjective:    Patient ID: Katherine Rosario, female    DOB: [birth year], 52 y.o.   MRN: [id number]  HPI Patient presents for yearly preventative medicine examination. She is a pleasant 52 year old female who  has a past medical history of Allergic rhinitis, Anemia, Asthma, Family history of adverse reaction to anesthesia, Fibroids, GERD (gastroesophageal reflux disease), Heart palpitations, History of kidney stones, Hyperlipidemia, Migraines, OSA (obstructive sleep apnea) (05/06/2014), Pneumonia, and Rheumatoid arthritis (HCC).   S/p laparoscopic sleeve gastrectomy done in 08/2022. She feels as though this is one of the best decisions she has ever made. She is currently at target weight.  Wt Readings from Last 3 Encounters:  04/20/23 139 lb 3.2 oz (63.1 kg)  12/12/22 161 lb 12.8 oz (73.4 kg)  09/26/22 183 lb (83 kg)    Migraine Headaches -has Imitrex as needed.  Her migraines are infrequent  Seasonal Allergies-controlled with over-the-counter medications  Asthma -managed by pulmonary.  Currently managed with Symbicort and albuterol inhaler.  Rheumatoid Arthritis -diagnosed in 2022.  She is followed by rheumatology.  She is currently managed with Simponi injections. With weight loss she has been able to go down in her dose strength.   Hyperlipidemia - managed with crestor 5 mg daily  Lab Results  Component Value Date   CHOL 183 04/04/2022   HDL 61.10 04/04/2022   LDLCALC 99 04/04/2022   LDLDIRECT 150.8 08/25/2013   TRIG 110.0 04/04/2022   CHOLHDL 3 04/04/2022   OSA - continues to wear CPAP. She has a meeting with Pulmonary in October to see if she can come off CPAP  Insomnia - uses Trazaone PRN.   All immunizations and health maintenance protocols were reviewed with the patient and needed orders were placed.  Appropriate screening laboratory values were ordered for the patient including screening of hyperlipidemia, renal function and hepatic function.  Medication  reconciliation,  past medical history, social history, problem list and allergies were reviewed in detail with the patient  Goals were established with regard to weight loss, exercise, and  diet in compliance with medications  She is up to date on routine colon cancer screening, mammograms and GYN care  Review of Systems  Constitutional: Negative.   HENT: Negative.    Eyes: Negative.   Respiratory: Negative.    Cardiovascular: Negative.   Gastrointestinal: Negative.   Endocrine: Negative.   Genitourinary: Negative.   Musculoskeletal: Negative.   Skin: Negative.   Allergic/Immunologic: Negative.   Neurological: Negative.   Hematological: Negative.   Psychiatric/Behavioral: Negative.     Past Medical History:  Diagnosis Date   Allergic rhinitis    Anemia    Heavy menstrual flow/OTC FE BID:     Asthma    Family history of adverse reaction to anesthesia    Dad's heart stopped after surgery   Fibroids    GERD (gastroesophageal reflux disease)    Heart palpitations    History of kidney stones    Hyperlipidemia    Migraines    OSA (obstructive sleep apnea) 05/06/2014   Pneumonia    Rheumatoid arthritis (HCC)     Social History   Socioeconomic History   Marital status: Divorced    Spouse name: Not on file   Number of children: Not on file   Years of education: Not on file   Highest education level: Some college, no degree  Occupational History   Occupation: Clinical research associate: [employer]  Tobacco Use   Smoking  status: Never    Passive exposure: Never   Smokeless tobacco: Former    Types: Snuff    Quit date: 2021  Vaping Use   Vaping status: Never Used  Substance and Sexual Activity   Alcohol use: Yes    Alcohol/week: 0.0 standard drinks of alcohol    Comment: occ   Drug use: No   Sexual activity: Never  Other Topics Concern   Not on file  Social History Narrative   married to female partner Fleet Contras 12/2012, employed for  Pharmacist, hospital.      Pt lives with wife, in a one story home. Has some college education.       She likes to hike and swim.    Social Determinants of Health   Financial Resource Strain: Medium Risk (12/26/2021)   Overall Financial Resource Strain (CARDIA)    Difficulty of Paying Living Expenses: Somewhat hard  Food Insecurity: No Food Insecurity (04/20/2023)   Hunger Vital Sign    Worried About Running Out of Food in the Last Year: Never true    Ran Out of Food in the Last Year: Never true  Transportation Needs: No Transportation Needs (07/17/2022)   PRAPARE - Administrator, Civil Service (Medical): No    Lack of Transportation (Non-Medical): No  Physical Activity: Sufficiently Active (04/20/2023)   Exercise Vital Sign    Days of Exercise per Week: 7 days    Minutes of Exercise per Session: 60 min  Stress: No Stress Concern Present (04/20/2023)   [employer] of Occupational Health - Occupational Stress Questionnaire    Feeling of Stress : Only a little  Social Connections: Socially Isolated (04/20/2023)   Social Connection and Isolation Panel [NHANES]    Frequency of Communication with Friends and Family: Never    Frequency of Social Gatherings with Friends and Family: Once a week    Attends Religious Services: Never    Database administrator or Organizations: No    Attends Banker Meetings: Never    Marital Status: Divorced  Catering manager Violence: Not At Risk (04/20/2023)   Humiliation, Afraid, Rape, and Kick questionnaire    Fear of Current or Ex-Partner: No    Emotionally Abused: No    Physically Abused: No    Sexually Abused: No    Past Surgical History:  Procedure Laterality Date   LAPAROSCOPIC GASTRIC SLEEVE RESECTION N/A 07/17/2022   Procedure: LAPAROSCOPIC SLEEVE GASTRECTOMY;  Surgeon: Berna Bue, MD;  Location: [facility];  Service: General;  Laterality: N/A;   UPPER GI ENDOSCOPY N/A 07/17/2022   Procedure: UPPER GI ENDOSCOPY;   Surgeon: Berna Bue, MD;  Location: [facility];  Service: General;  Laterality: N/A;   UTERINE FIBROID EMBOLIZATION     WISDOM TOOTH EXTRACTION      Family History  Problem Relation Age of Onset   Hyperlipidemia Mother    Hypertension Mother    Colon cancer Father 67   Hyperlipidemia Father    Hypertension Father    Prostate cancer Father 33   Heart attack Neg Hx    Stroke Neg Hx     No Known Allergies  Current Outpatient Medications on File Prior to Visit  Medication Sig Dispense Refill   albuterol (VENTOLIN HFA) 108 (90 Base) MCG/ACT inhaler Inhale 2 puffs into the lungs every 6 (six) hours as needed for wheezing or shortness of breath. 6.7 g 0   B Complex-C (B-COMPLEX WITH VITAMIN C) tablet Take 1 tablet by  mouth daily.     Cholecalciferol (VITAMIN D) 125 MCG (5000 UT) CAPS Take 5,000 Units by mouth daily.     fluticasone (FLONASE) 50 MCG/ACT nasal spray Place 2 sprays into both nostrils daily. 16 g 6   folic acid (FOLVITE) 1 MG tablet Take 1 mg by mouth daily.     golimumab (SIMPONI ARIA) 50 MG/4ML SOLN injection Inject 50 mg into the vein every 8 (eight) weeks. Dose may change due to weight     methotrexate 2.5 MG tablet Take 20 mg by mouth once a week. Saturday (Taking 8 tabs weekly)     pantoprazole (PROTONIX) 40 MG tablet Take 1 tablet (40 mg total) by mouth daily. Take this medication daily, regardless of reflux symptoms 90 tablet 0   rosuvastatin (CRESTOR) 5 MG tablet TAKE 1 TABLET(5 MG) BY MOUTH AT BEDTIME (Patient taking differently: Take 5 mg by mouth daily.) 90 tablet 3   SUMAtriptan (IMITREX) 100 MG tablet TAKE 1 TAB AT ONSET OF MIGRAINE REPEAT IN 2 HRS IF NEEDED 10 tablet 3   budesonide-formoterol (SYMBICORT) 80-4.5 MCG/ACT inhaler Inhale 2 puffs into the lungs in the morning and at bedtime. (Patient not taking: Reported on 04/20/2023) 1 each 5   traZODone (DESYREL) 50 MG tablet Take 0.5-1 tablets (25-50 mg total) by mouth at bedtime as needed for sleep. (Patient  not taking: Reported on 04/20/2023) 90 tablet 0   No current facility-administered medications on file prior to visit.    BP 118/70 (BP Location: Left Arm, Patient Position: Sitting, Cuff Size: Normal)   Pulse 66   Temp 97.9 F (36.6 C) (Oral)   Ht 5\' 1"  (1.549 m)   Wt 139 lb 3.2 oz (63.1 kg)   LMP 10/24/2014   SpO2 97%   BMI 26.30 kg/m       Objective:   Physical Exam Vitals and nursing note reviewed.  Constitutional:      General: She is not in acute distress.    Appearance: Normal appearance. She is not ill-appearing.  HENT:     Head: Normocephalic and atraumatic.     Right Ear: Tympanic membrane, ear canal and external ear normal. There is no impacted cerumen.     Left Ear: Tympanic membrane, ear canal and external ear normal. There is no impacted cerumen.     Nose: Nose normal. No congestion or rhinorrhea.     Mouth/Throat:     Mouth: Mucous membranes are moist.     Pharynx: Oropharynx is clear.  Eyes:     Extraocular Movements: Extraocular movements intact.     Conjunctiva/sclera: Conjunctivae normal.     Pupils: Pupils are equal, round, and reactive to light.  Neck:     Vascular: No carotid bruit.  Cardiovascular:     Rate and Rhythm: Normal rate and regular rhythm.     Pulses: Normal pulses.     Heart sounds: No murmur heard.    No friction rub. No gallop.  Pulmonary:     Effort: Pulmonary effort is normal.     Breath sounds: Normal breath sounds.  Abdominal:     General: Abdomen is flat. Bowel sounds are normal. There is no distension.     Palpations: Abdomen is soft. There is no mass.     Tenderness: There is no abdominal tenderness. There is no guarding or rebound.     Hernia: No hernia is present.  Musculoskeletal:        General: Normal range of motion.  Cervical back: Normal range of motion and neck supple.  Lymphadenopathy:     Cervical: No cervical adenopathy.  Skin:    General: Skin is warm and dry.     Capillary Refill: Capillary refill  takes less than 2 seconds.  Neurological:     General: No focal deficit present.     Mental Status: She is alert and oriented to person, place, and time.  Psychiatric:        Mood and Affect: Mood normal.        Behavior: Behavior normal.        Thought Content: Thought content normal.        Judgment: Judgment normal.       Assessment & Plan:  1. Routine general medical examination at a health care facility Today patient counseled on age appropriate routine health concerns for screening and prevention, each reviewed and up to date or declined. Immunizations reviewed and up to date or declined. Labs ordered and reviewed. Risk factors for depression reviewed and negative. Hearing function and visual acuity are intact. ADLs screened and addressed as needed. Functional ability and level of safety reviewed and appropriate. Education, counseling and referrals performed based on assessed risks today. Patient provided with a copy of personalized plan for preventive services. - She looks great!  - Continue to stay active and eat heathy  - Follow up in one year or sooner if needed  2. Moderate persistent asthma with acute exacerbation - Continue with current inhalers  - CBC with Differential/Platelet; Future - Comprehensive metabolic panel; Future - Lipid panel; Future - TSH; Future - TSH - Lipid panel - Comprehensive metabolic panel - CBC with Differential/Platelet  3. History of migraine headaches - Continue with Imitrex PRN  - CBC with Differential/Platelet; Future - Comprehensive metabolic panel; Future - Lipid panel; Future - TSH; Future - SUMAtriptan (IMITREX) 100 MG tablet; TAKE 1 TAB AT ONSET OF MIGRAINE REPEAT IN 2 HRS IF NEEDED  Dispense: 10 tablet; Refill: 3 - TSH - Lipid panel - Comprehensive metabolic panel - CBC with Differential/Platelet  4. Mixed hyperlipidemia - Likely d/c statin due to weight loss  - CBC with Differential/Platelet; Future - Comprehensive  metabolic panel; Future - Lipid panel; Future - TSH; Future - TSH - Lipid panel - Comprehensive metabolic panel - CBC with Differential/Platelet  5. Rheumatoid arthritis involving multiple sites, unspecified whether rheumatoid factor present (HCC) - Per Rheumatology  - CBC with Differential/Platelet; Future - Comprehensive metabolic panel; Future - Lipid panel; Future - TSH; Future - TSH - Lipid panel - Comprehensive metabolic panel - CBC with Differential/Platelet  6. S/P gastric sleeve procedure  - CBC with Differential/Platelet; Future - Comprehensive metabolic panel; Future - Lipid panel; Future - TSH; Future - TSH - Lipid panel - Comprehensive metabolic panel - CBC with Differential/Platelet  7. OSA (obstructive sleep apnea) - Per pulmonary   8. Primary insomnia - Continue with Trazadone PRN  Shirline Frees, NP

## 2023-04-24 DIAGNOSIS — M62830 Muscle spasm of back: Secondary | ICD-10-CM | POA: Diagnosis not present

## 2023-04-24 DIAGNOSIS — M9901 Segmental and somatic dysfunction of cervical region: Secondary | ICD-10-CM | POA: Diagnosis not present

## 2023-04-24 DIAGNOSIS — M530 Cervicocranial syndrome: Secondary | ICD-10-CM | POA: Diagnosis not present

## 2023-04-24 DIAGNOSIS — M9902 Segmental and somatic dysfunction of thoracic region: Secondary | ICD-10-CM | POA: Diagnosis not present

## 2023-04-25 ENCOUNTER — Other Ambulatory Visit: Payer: Self-pay | Admitting: Adult Health

## 2023-04-25 DIAGNOSIS — D72829 Elevated white blood cell count, unspecified: Secondary | ICD-10-CM

## 2023-05-01 DIAGNOSIS — M9902 Segmental and somatic dysfunction of thoracic region: Secondary | ICD-10-CM | POA: Diagnosis not present

## 2023-05-01 DIAGNOSIS — M530 Cervicocranial syndrome: Secondary | ICD-10-CM | POA: Diagnosis not present

## 2023-05-01 DIAGNOSIS — M62830 Muscle spasm of back: Secondary | ICD-10-CM | POA: Diagnosis not present

## 2023-05-01 DIAGNOSIS — M9901 Segmental and somatic dysfunction of cervical region: Secondary | ICD-10-CM | POA: Diagnosis not present

## 2023-05-04 ENCOUNTER — Other Ambulatory Visit: Payer: BC Managed Care – PPO

## 2023-05-08 ENCOUNTER — Telehealth: Payer: Self-pay | Admitting: Adult Health

## 2023-05-08 ENCOUNTER — Other Ambulatory Visit (INDEPENDENT_AMBULATORY_CARE_PROVIDER_SITE_OTHER): Payer: BC Managed Care – PPO

## 2023-05-08 DIAGNOSIS — D72829 Elevated white blood cell count, unspecified: Secondary | ICD-10-CM | POA: Diagnosis not present

## 2023-05-08 DIAGNOSIS — M9902 Segmental and somatic dysfunction of thoracic region: Secondary | ICD-10-CM | POA: Diagnosis not present

## 2023-05-08 DIAGNOSIS — M530 Cervicocranial syndrome: Secondary | ICD-10-CM | POA: Diagnosis not present

## 2023-05-08 DIAGNOSIS — M62830 Muscle spasm of back: Secondary | ICD-10-CM | POA: Diagnosis not present

## 2023-05-08 DIAGNOSIS — M9901 Segmental and somatic dysfunction of cervical region: Secondary | ICD-10-CM | POA: Diagnosis not present

## 2023-05-08 LAB — CBC
HCT: 48.1 % — ABNORMAL HIGH (ref 36.0–46.0)
Hemoglobin: 15.5 g/dL — ABNORMAL HIGH (ref 12.0–15.0)
MCHC: 32.2 g/dL (ref 30.0–36.0)
MCV: 97.3 fl (ref 78.0–100.0)
Platelets: 367 10*3/uL (ref 150.0–400.0)
RBC: 4.94 Mil/uL (ref 3.87–5.11)
RDW: 14.1 % (ref 11.5–15.5)
WBC: 12.1 10*3/uL — ABNORMAL HIGH (ref 4.0–10.5)

## 2023-05-08 NOTE — Telephone Encounter (Signed)
fell and injured hand, swollen and painful requesting xray. Has appt for labs at 10:40a today (05/08/23)

## 2023-05-08 NOTE — Telephone Encounter (Signed)
Made patient an appointment to see COry on 05/09/23 per [facility].

## 2023-05-08 NOTE — Telephone Encounter (Signed)
Noted  

## 2023-05-09 ENCOUNTER — Encounter: Payer: Self-pay | Admitting: Adult Health

## 2023-05-09 ENCOUNTER — Other Ambulatory Visit: Payer: Self-pay | Admitting: Adult Health

## 2023-05-09 ENCOUNTER — Ambulatory Visit: Payer: BC Managed Care – PPO | Admitting: Adult Health

## 2023-05-09 ENCOUNTER — Ambulatory Visit (INDEPENDENT_AMBULATORY_CARE_PROVIDER_SITE_OTHER): Payer: BC Managed Care – PPO

## 2023-05-09 VITALS — BP 110/80 | HR 92 | Temp 98.0°F | Ht 61.0 in | Wt 132.0 lb

## 2023-05-09 DIAGNOSIS — S0591XA Unspecified injury of right eye and orbit, initial encounter: Secondary | ICD-10-CM | POA: Diagnosis not present

## 2023-05-09 DIAGNOSIS — D72829 Elevated white blood cell count, unspecified: Secondary | ICD-10-CM

## 2023-05-09 DIAGNOSIS — M79641 Pain in right hand: Secondary | ICD-10-CM

## 2023-05-09 DIAGNOSIS — H5711 Ocular pain, right eye: Secondary | ICD-10-CM | POA: Diagnosis not present

## 2023-05-09 DIAGNOSIS — S62326A Displaced fracture of shaft of fifth metacarpal bone, right hand, initial encounter for closed fracture: Secondary | ICD-10-CM | POA: Diagnosis not present

## 2023-05-09 DIAGNOSIS — R55 Syncope and collapse: Secondary | ICD-10-CM | POA: Diagnosis not present

## 2023-05-09 DIAGNOSIS — S6291XA Unspecified fracture of right wrist and hand, initial encounter for closed fracture: Secondary | ICD-10-CM

## 2023-05-09 LAB — BASIC METABOLIC PANEL
BUN: 20 mg/dL (ref 6–23)
CO2: 29 meq/L (ref 19–32)
Calcium: 9.7 mg/dL (ref 8.4–10.5)
Chloride: 102 meq/L (ref 96–112)
Creatinine, Ser: 0.78 mg/dL (ref 0.40–1.20)
GFR: 85.98 mL/min (ref >60.00)
Glucose, Bld: 82 mg/dL (ref 70–99)
Potassium: 4.7 meq/L (ref 3.5–5.1)
Sodium: 139 meq/L (ref 135–145)

## 2023-05-09 LAB — C-REACTIVE PROTEIN: CRP: <1 mg/dL (ref 0.5–20.0)

## 2023-05-09 MED ORDER — TRAMADOL HCL 50 MG PO TABS
50.0000 mg | ORAL_TABLET | Freq: Three times a day (TID) | ORAL | 0 refills | Status: AC | PRN
Start: 2023-05-09 — End: 2023-05-14

## 2023-05-09 NOTE — Progress Notes (Signed)
Subjective:    Patient ID: Su Grand, female    DOB: 07/09/1971, 52 y.o.   MRN: [id number]  Fall   52 year old female who  has a past medical history of Allergic rhinitis, Anemia, Asthma, Family history of adverse reaction to anesthesia, Fibroids, GERD (gastroesophageal reflux disease), Heart palpitations, History of kidney stones, Hyperlipidemia, Migraines, OSA (obstructive sleep apnea) (05/06/2014), Pneumonia, and Rheumatoid arthritis (HCC).  She presents to the office today for an acute issue. She reports that she " passed out" two days ago. She was sitting on the couch watching TV and did not feel well " wobbly", she got up to get get gatorade and passed out into the dinning room table. Hitting her right hand, chin and face on a table.   It should be noted that routine labs done earlier this month showed a mildly elevated white blood cell count of 10.6 on repeat lab work yesterday white blood cell count slightly increased to 12.1. She does report feeling " a little tight in her chest and wheezy". She denies worsening pain with RA. She has not had any fevers or chills.     Review of Systems See HPI   Past Medical History:  Diagnosis Date   Allergic rhinitis    Anemia    Heavy menstrual flow/OTC FE BID:     Asthma    Family history of adverse reaction to anesthesia    Dad's heart stopped after surgery   Fibroids    GERD (gastroesophageal reflux disease)    Heart palpitations    History of kidney stones    Hyperlipidemia    Migraines    OSA (obstructive sleep apnea) 05/06/2014   Pneumonia    Rheumatoid arthritis (HCC)     Social History   Socioeconomic History   Marital status: Divorced    Spouse name: Not on file   Number of children: Not on file   Years of education: Not on file   Highest education level: Some college, no degree  Occupational History   Occupation: Clinical research associate: [employer]  Tobacco Use   Smoking status: Never     Passive exposure: Never   Smokeless tobacco: Former    Types: Snuff    Quit date: 2021  Vaping Use   Vaping status: Never Used  Substance and Sexual Activity   Alcohol use: Yes    Alcohol/week: 0.0 standard drinks of alcohol    Comment: occ   Drug use: No   Sexual activity: Never  Other Topics Concern   Not on file  Social History Narrative   married to female partner Fleet Contras 12/2012, employed for Pharmacist, hospital.      Pt lives with wife, in a one story home. Has some college education.       She likes to hike and swim.    Social Determinants of Health   Financial Resource Strain: Medium Risk (12/26/2021)   Overall Financial Resource Strain (CARDIA)    Difficulty of Paying Living Expenses: Somewhat hard  Food Insecurity: No Food Insecurity (04/20/2023)   Hunger Vital Sign    Worried About Running Out of Food in the Last Year: Never true    Ran Out of Food in the Last Year: Never true  Transportation Needs: No Transportation Needs (07/17/2022)   PRAPARE - Administrator, Civil Service (Medical): No    Lack of Transportation (Non-Medical): No  Physical Activity: Sufficiently Active (04/20/2023)   Exercise  Vital Sign    Days of Exercise per Week: 7 days    Minutes of Exercise per Session: 60 min  Stress: No Stress Concern Present (04/20/2023)   [employer] of Occupational Health - Occupational Stress Questionnaire    Feeling of Stress : Only a little  Social Connections: Socially Isolated (04/20/2023)   Social Connection and Isolation Panel [NHANES]    Frequency of Communication with Friends and Family: Never    Frequency of Social Gatherings with Friends and Family: Once a week    Attends Religious Services: Never    Database administrator or Organizations: No    Attends Banker Meetings: Never    Marital Status: Divorced  Catering manager Violence: Not At Risk (04/20/2023)   Humiliation, Afraid, Rape, and Kick questionnaire    Fear of  Current or Ex-Partner: No    Emotionally Abused: No    Physically Abused: No    Sexually Abused: No    Past Surgical History:  Procedure Laterality Date   LAPAROSCOPIC GASTRIC SLEEVE RESECTION N/A 07/17/2022   Procedure: LAPAROSCOPIC SLEEVE GASTRECTOMY;  Surgeon: Berna Bue, MD;  Location: [facility];  Service: General;  Laterality: N/A;   UPPER GI ENDOSCOPY N/A 07/17/2022   Procedure: UPPER GI ENDOSCOPY;  Surgeon: Berna Bue, MD;  Location: [facility];  Service: General;  Laterality: N/A;   UTERINE FIBROID EMBOLIZATION     WISDOM TOOTH EXTRACTION      Family History  Problem Relation Age of Onset   Hyperlipidemia Mother    Hypertension Mother    Colon cancer Father 50   Hyperlipidemia Father    Hypertension Father    Prostate cancer Father 16   Heart attack Neg Hx    Stroke Neg Hx     No Known Allergies  Current Outpatient Medications on File Prior to Visit  Medication Sig Dispense Refill   albuterol (VENTOLIN HFA) 108 (90 Base) MCG/ACT inhaler Inhale 2 puffs into the lungs every 6 (six) hours as needed for wheezing or shortness of breath. 6.7 g 0   B Complex-C (B-COMPLEX WITH VITAMIN C) tablet Take 1 tablet by mouth daily.     budesonide-formoterol (SYMBICORT) 80-4.5 MCG/ACT inhaler Inhale 2 puffs into the lungs in the morning and at bedtime. 1 each 5   cetirizine (ZYRTEC) 10 MG chewable tablet Chew by mouth.     Cholecalciferol (VITAMIN D) 125 MCG (5000 UT) CAPS Take 5,000 Units by mouth daily.     fluticasone (FLONASE) 50 MCG/ACT nasal spray Place 2 sprays into both nostrils daily. 16 g 6   folic acid (FOLVITE) 1 MG tablet Take 1 mg by mouth daily.     golimumab (SIMPONI ARIA) 50 MG/4ML SOLN injection Inject 50 mg into the vein every 8 (eight) weeks. Dose may change due to weight     methotrexate 2.5 MG tablet Take 20 mg by mouth once a week. Saturday (Taking 8 tabs weekly)     montelukast (SINGULAIR) 10 MG tablet Take by mouth.     pantoprazole (PROTONIX) 40 MG  tablet Take 1 tablet (40 mg total) by mouth daily. Take this medication daily, regardless of reflux symptoms 90 tablet 0   rosuvastatin (CRESTOR) 5 MG tablet TAKE 1 TABLET(5 MG) BY MOUTH AT BEDTIME (Patient taking differently: Take 5 mg by mouth daily.) 90 tablet 3   SUMAtriptan (IMITREX) 100 MG tablet TAKE 1 TAB AT ONSET OF MIGRAINE REPEAT IN 2 HRS IF NEEDED 10 tablet 3   traZODone (  DESYREL) 50 MG tablet Take 0.5-1 tablets (25-50 mg total) by mouth at bedtime as needed for sleep. 90 tablet 0   No current facility-administered medications on file prior to visit.    BP 110/80   Pulse 92   Temp 98 F (36.7 C) (Oral)   Ht 5\' 1"  (1.549 m)   Wt 132 lb (59.9 kg)   LMP 10/24/2014   SpO2 98%   BMI 24.94 kg/m       Objective:   Physical Exam Vitals and nursing note reviewed.  Constitutional:      Appearance: Normal appearance.  Eyes:     Extraocular Movements: Extraocular movements intact.     Conjunctiva/sclera: Conjunctivae normal.   Cardiovascular:     Rate and Rhythm: Normal rate and regular rhythm.     Pulses: Normal pulses.     Heart sounds: Normal heart sounds.  Pulmonary:     Effort: Pulmonary effort is normal.     Breath sounds: Normal breath sounds.  Musculoskeletal:        General: Normal range of motion.     Right hand: Tenderness and bony tenderness present. Decreased strength. There is no disruption of two-point discrimination. Normal capillary refill. Normal pulse.     Comments: Bruising and swelling noted to the dorsal aspect of her right hand above fingers 3-5. She has a small wound noted towards finger 5 on dorsal aspect. Bruising noted on palmer surface of right hand has well.   She is unable to make fist  Skin:    General: Skin is warm and dry.  Neurological:     General: No focal deficit present.     Mental Status: She is alert and oriented to person, place, and time.  Psychiatric:        Mood and Affect: Mood normal.        Behavior: Behavior normal.         Thought Content: Thought content normal.        Judgment: Judgment normal.       Assessment & Plan:  1. Syncope and collapse -  - EKG 12-Lead- NSR with occassional ectopic beats, Rate 80 - Consider dehydration vs arrhythmia vs vasovagal. Will check labs and consider referral to Cardiology   - Basic Metabolic Panel; Future - C-reactive Protein; Future  2. Leukocytosis, unspecified type - Unknown cause  - DG Chest 2 View; Future  3. Right hand pain - Concern for fracture. Will xray today and likely need to send to orthopedics.  - Continue to Ice. Will send in Tramadol for pain relief  - DG Hand Complete Right; Future - traMADol (ULTRAM) 50 MG tablet; Take 1 tablet (50 mg total) by mouth every 8 (eight) hours as needed for up to 5 days.  Dispense: 15 tablet; Refill: 0  4. Pain of right orbit  - DG Orbits; Future  Shirline Frees, NP

## 2023-05-10 ENCOUNTER — Other Ambulatory Visit: Payer: Self-pay | Admitting: Adult Health

## 2023-05-10 DIAGNOSIS — R55 Syncope and collapse: Secondary | ICD-10-CM

## 2023-05-14 ENCOUNTER — Ambulatory Visit: Payer: BC Managed Care – PPO | Admitting: Orthopedic Surgery

## 2023-05-14 ENCOUNTER — Other Ambulatory Visit (INDEPENDENT_AMBULATORY_CARE_PROVIDER_SITE_OTHER): Payer: BC Managed Care – PPO

## 2023-05-14 DIAGNOSIS — S6291XD Unspecified fracture of right wrist and hand, subsequent encounter for fracture with routine healing: Secondary | ICD-10-CM | POA: Diagnosis not present

## 2023-05-14 DIAGNOSIS — M79641 Pain in right hand: Secondary | ICD-10-CM | POA: Diagnosis not present

## 2023-05-14 NOTE — Progress Notes (Signed)
Katherine BLACKSHER - 52 y.o. female MRN [id number]  Date of birth: [birth year]  Office Visit Note: Visit Date: 05/14/2023 PCP: Shirline Frees, NP Referred by: Shirline Frees, NP  Subjective: No chief complaint on file.  HPI: Katherine Rosario is a pleasant 52 y.o. female who presents today for evaluation for a right small finger metacarpal neck fracture sustained 5 days prior.  She did have a vasovagal episode and fell onto the right hand.  She is currently undergoing workup with cardiology and neurology.  Pertinent ROS were reviewed with the patient and found to be negative unless otherwise specified above in HPI.   Visit Reason: right hand Hand dominance: right Occupation: quality control; plant Diabetic: No Heart/Lung History: asthma Blood Thinners: none  Prior Testing/EMG: xrays 05/09/23 Injections (Date): none Treatments: tramadol PRN Prior Surgery: none   Assessment & Plan: Visit Diagnoses:  1. Closed fracture of right hand with routine healing, subsequent encounter     Plan: Repeat x-ray was taken today which confirms stable appearance of the right small finger metacarpal neck fracture with approximately 50 degrees of angulation.  This is acceptable for nonoperative criteria.  Will have her fitted today for an ulnar gutter splint to allow for healing.  Splint should be utilized at all times except for bathing, no heavy lifting.  I will plan on seeing her back in approximate 3 weeks time for repeat clinical and radiographic check.  X-rays reviewed in detail with patient today, she expressed full understanding.  Follow-up: Return in about 3 weeks (around 06/04/2023).   Meds & Orders: No orders of the defined types were placed in this encounter.   Orders Placed This Encounter  Procedures   XR Hand Complete Right     Procedures: No procedures performed      Clinical History: No specialty comments available.  She reports that she has never smoked. She has never been  exposed to tobacco smoke. She quit smokeless tobacco use about 3 years ago.  Her smokeless tobacco use included snuff. No results for input(s): "HGBA1C", "LABURIC" in the last 8760 hours.  Objective:   Vital Signs: LMP 10/24/2014   Physical Exam  Gen: Well-appearing, in no acute distress; non-toxic CV: Regular Rate. Well-perfused. Warm.  Resp: Breathing unlabored on room air; no wheezing. Psych: Fluid speech in conversation; appropriate affect; normal thought process  Ortho Exam Right hand: - Mild swelling over the small finger metacarpal region, tenderness to palpation at the metacarpal neck - No rotational abnormality, able to perform range of motion with flexion of digits, no significant overlap appreciated, normal cascade to the digits - Sensation intact distally, hand is warm well-perfused  Imaging: XR Hand Complete Right  Result Date: 05/14/2023 X-rays of the right hand, multiple views were obtained today X-rays demonstrate right small finger metacarpal neck fracture with approximately 50 degrees of angulation.  No other significant bony abnormalities appreciated.   Past Medical/Family/Surgical/Social History: Medications & Allergies reviewed per EMR, new medications updated. Patient Active Problem List   Diagnosis Date Noted   Morbid obesity (HCC) 07/17/2022   Rheumatoid arthritis (HCC) 06/16/2022   Dysfunction of left eustachian tube 03/12/2017   Bilateral edema of lower extremity 09/01/2015   Migraine 04/27/2015   Palpitations 06/23/2014   Abnormal ECG 06/23/2014   Syncope 06/23/2014   Orthostatic hypotension 06/23/2014   OSA (obstructive sleep apnea) 05/06/2014   Other malaise and fatigue 05/02/2014   Nonspecific elevation of levels of transaminase or lactic acid dehydrogenase (LDH) 05/02/2014  Acute upper respiratory infections of unspecified site 11/19/2013   Morbid (severe) obesity due to excess calories (HCC) 08/25/2013   Allergic rhinitis 08/25/2013   Anemia  08/23/2012   Heart palpitations    Asthma    GERD (gastroesophageal reflux disease)    Hyperlipidemia    Intractable migraine without aura 01/18/1999   Headache 12/24/1998   Past Medical History:  Diagnosis Date   Allergic rhinitis    Anemia    Heavy menstrual flow/OTC FE BID:     Asthma    Family history of adverse reaction to anesthesia    Dad's heart stopped after surgery   Fibroids    GERD (gastroesophageal reflux disease)    Heart palpitations    History of kidney stones    Hyperlipidemia    Migraines    OSA (obstructive sleep apnea) 05/06/2014   Pneumonia    Rheumatoid arthritis (HCC)    Family History  Problem Relation Age of Onset   Hyperlipidemia Mother    Hypertension Mother    Colon cancer Father 20   Hyperlipidemia Father    Hypertension Father    Prostate cancer Father 34   Heart attack Neg Hx    Stroke Neg Hx    Past Surgical History:  Procedure Laterality Date   LAPAROSCOPIC GASTRIC SLEEVE RESECTION N/A 07/17/2022   Procedure: LAPAROSCOPIC SLEEVE GASTRECTOMY;  Surgeon: Berna Bue, MD;  Location: [facility];  Service: General;  Laterality: N/A;   UPPER GI ENDOSCOPY N/A 07/17/2022   Procedure: UPPER GI ENDOSCOPY;  Surgeon: Berna Bue, MD;  Location: [facility];  Service: General;  Laterality: N/A;   UTERINE FIBROID EMBOLIZATION     WISDOM TOOTH EXTRACTION     Social History   Occupational History   Occupation: Clinical research associate: [employer]  Tobacco Use   Smoking status: Never    Passive exposure: Never   Smokeless tobacco: Former    Types: Snuff    Quit date: 2021  Vaping Use   Vaping status: Never Used  Substance and Sexual Activity   Alcohol use: Yes    Alcohol/week: 0.0 standard drinks of alcohol    Comment: occ   Drug use: No   Sexual activity: Never    Katherine Rosario) Katherine Rosario, M.D. [facility] 2:59 PM

## 2023-05-31 DIAGNOSIS — M0609 Rheumatoid arthritis without rheumatoid factor, multiple sites: Secondary | ICD-10-CM | POA: Diagnosis not present

## 2023-06-04 DIAGNOSIS — Z124 Encounter for screening for malignant neoplasm of cervix: Secondary | ICD-10-CM | POA: Diagnosis not present

## 2023-06-04 DIAGNOSIS — Z01419 Encounter for gynecological examination (general) (routine) without abnormal findings: Secondary | ICD-10-CM | POA: Diagnosis not present

## 2023-06-04 DIAGNOSIS — Z1151 Encounter for screening for human papillomavirus (HPV): Secondary | ICD-10-CM | POA: Diagnosis not present

## 2023-06-04 DIAGNOSIS — Z6825 Body mass index (BMI) 25.0-25.9, adult: Secondary | ICD-10-CM | POA: Diagnosis not present

## 2023-06-05 ENCOUNTER — Ambulatory Visit: Payer: BC Managed Care – PPO | Admitting: Primary Care

## 2023-06-05 ENCOUNTER — Encounter: Payer: Self-pay | Admitting: Primary Care

## 2023-06-05 VITALS — BP 110/64 | HR 62 | Ht 61.0 in | Wt 138.0 lb

## 2023-06-05 DIAGNOSIS — G4733 Obstructive sleep apnea (adult) (pediatric): Secondary | ICD-10-CM

## 2023-06-05 NOTE — Patient Instructions (Addendum)
Stop Symbicort, if asthma symptoms flare let us know Keep albuterol with you at all times and use as needed for breakthrough sob/wheezing  Potentially could change you from Albuterol to Airsupra (albuterol-budesonide) as rescue inhaler if having symptoms   Orders: HST re: OSA  Follow-up: 6 months with Beth NP or sooner if needed   Asthma, Adult  Asthma is a condition that causes swelling and narrowing of the airways. These are the passages that lead from the nose and mouth down into the lungs. When asthma symptoms get worse it is called an asthma attack or flare. This can make it hard to breathe. Asthma flares can range from minor to life-threatening. There is no cure for asthma, but medicines and lifestyle changes can help to control it. What are the causes? It is not known exactly what causes asthma, but certain things can cause asthma symptoms to get worse (triggers). What can trigger an asthma attack? Cigarette smoke. Mold. Dust. Your pet's skin flakes (dander). Cockroaches. Pollen. Air pollution (like household cleaners, wood smoke, smog, or Therapist, occupational). What are the signs or symptoms? Trouble breathing (shortness of breath). Coughing. Making high-pitched whistling sounds when you breathe, most often when you breathe out (wheezing). Chest tightness. Tiredness with little activity. Poor exercise tolerance. How is this treated? Controller medicines that help prevent asthma symptoms. Fast-acting reliever or rescue medicines. These give short-term relief of asthma symptoms. Allergy medicines if your attacks are brought on by allergens. Medicines to help control the body's defense (immune) system. Staying away from the things that cause asthma attacks. Follow these instructions at home: Avoiding triggers in your home Do not allow anyone to smoke in your home. Limit use of fireplaces and wood stoves. Get rid of pests (such as roaches and mice) and their droppings. Keep  your home clean. Clean your floors. Dust regularly. Use cleaning products that do not smell. Wash bed sheets and blankets every week in hot water. Dry them in a dryer. Have someone vacuum when you are not home. Change your heating and air conditioning filters often. Use blankets that are made of polyester or cotton. General instructions Take over-the-counter and prescription medicines only as told by your doctor. Do not smoke or use any products that contain nicotine or tobacco. If you need help quitting, ask your doctor. Stay away from secondhand smoke. Avoid doing things outdoors when allergen counts are high and when air quality is low. Warm up before you exercise. Take time to cool down after exercise. Use a peak flow meter as told by your doctor. A peak flow meter is a tool that measures how well your lungs are working. Keep track of the peak flow meter's readings. Write them down. Follow your asthma action plan. This is a written plan for taking care of your asthma and treating your attacks. Make sure you get all the shots (vaccines) that your doctor recommends. Ask your doctor about a flu shot and a pneumonia shot. Keep all follow-up visits. Contact a doctor if: You have wheezing, shortness of breath, or a cough even while taking medicine to prevent attacks. The mucus you cough up (sputum) is thicker than usual. The mucus you cough up changes from clear or white to yellow, green, [place], or is bloody. You have problems from the medicine you are taking, such as: A rash. Itching. Swelling. Trouble breathing. You need reliever medicines more than 2-3 times a week. Your peak flow reading is still at 50-79% of your personal best after following  the action plan for 1 hour. You have a fever. Get help right away if: You seem to be worse and are not responding to medicine during an asthma attack. You are short of breath even at rest. You get short of breath when doing very little  activity. You have trouble eating, drinking, or talking. You have chest pain or tightness. You have a fast heartbeat. Your lips or fingernails start to turn blue. You are light-headed or dizzy, or you faint. Your peak flow is less than 50% of your personal best. You feel too tired to breathe normally. These symptoms may be an emergency. Get help right away. Call 911. Do not wait to see if the symptoms will go away. Do not drive yourself to the hospital. Summary Asthma is a long-term (chronic) condition in which the airways get tight and narrow. An asthma attack can make it hard to breathe. Asthma cannot be cured, but medicines and lifestyle changes can help control it. Make sure you understand how to avoid triggers and how and when to use your medicines. Avoid things that can cause allergy symptoms (allergens). These include animal skin flakes (dander) and pollen from trees or grass. Avoid things that pollute the air. These may include household cleaners, wood smoke, smog, or chemical odors. This information is not intended to replace advice given to you by your health care provider. Make sure you discuss any questions you have with your health care provider. Document Revised: 05/30/2021 Document Reviewed: 05/30/2021 Elsevier Patient Education  2024 [employer].

## 2023-06-05 NOTE — Progress Notes (Signed)
 @Patient  ID: Katherine Rosario, female    DOB: 06/25/1971, 52 y.o.   MRN: [id number]  No chief complaint on file.   Referring provider: Shirline Frees, NP  HPI: 52 year old female, never smoker followed for asthma and OSA. She is a patient of Dr. Shirlee More and last seen in office 06/16/2022 by [facility] NP. Past medical history significant for migraine, allergic rhinitis, GERD, HLD.  TEST/EVENTS:  07/06/2014 HST: AHI 11.9, SpO2 low 65%, average 94% 03/28/2022 HRCT chest: no evidence of fibrotic interstitial lung disease. Mild, lobular air trapping. There are occasional tiny nodules in the dependent LLL measuring 0.2 cm.  05/28/2022 HST: AHI 46.5/hr; SpO2 low 67%, average 93%  Previous [facility] pulmonary encounter: 03/20/2022: OV with Dr. Isaiah Serge. Changed from Arnuity to Symbicort in June 2023. Doing much better on this with improvement of cough and dyspnea. Use flonase, chlortab and pepcid. HRCT to rule out RA ILD. Hx of OSA but quit using CPAP 3 years ago - HST ordered for further evaluation.   06/16/2022: OV with [facility] NP to discuss her home sleep study results, which revealed severe OSA with AHI 46.5/h. She was on CPAP years ago, around 6-7 years ago, but her machine broke and she got it fixed once but then it broke again so she stopped using it. She struggles with excessive fatigue. She wakes up feeling poorly rested and wants to go back to bed. She snores very loudly. She has woken up gasping for air before. She does struggle with drowsy driving, especially when she's going into work. She has fallen asleep driving before but it's been years ago. She denies any sleep parasomnias/paralysis, history of narcolepsy or cataplexy.  She works third shirt from 10pm-8 am. She goes to bed around 11 am-noon. Wakes a few times to use the restroom. Officially gets out of the bed around 7-8pm. Doesn't have trouble falling asleep. She doesn't take any sleep aids. Her breathing is stable. Feels like the symbicort works well  for her. She rarely has to use her albuterol. Ran a 5K last week and used it. No increased cough, chest congestion or wheezing. She is getting ready for gastric sleeve surgery; tentatively set for November 2023.   09/08/2021 Patient presents today for follow-up and CPAP compliance.  She restarted CPAP therapy since she was here last.  Has been tolerating it well.  She did have some days where she missed using it last month due to the passing of her father in an unexpected car accident.  She had been traveling a lot and did not always take it back and forth with her.  Her sleep has also been a little affected by this to and she has not been sleeping as long as she normally does.  Trying to get back in a normal pattern.  Otherwise, she has not had any issues.  No significant leaks.  Feels like it is helping with her daytime fatigue symptoms.  Feels like she has not has much trouble with drowsy driving. Overall, her breathing has been stable.  She is currently on Symbicort 160 twice daily.  Rare use of albuterol.  No increased cough, congestion, wheezing.  Underwent gastric sleeve surgery in November.  No postoperative complications.  08/09/2022-09/07/2021 CPAP auto 5-20 cmH2O 25/30 days used; 77% >4hr; average usage 6 hours 12 minutes Pressure median 5.7, 95th 7.8 Leaks median 0, 95th 5.2 AHI 1.5   06/05/2023 Patient presents today for follow-up for OSA/mild intermittent asthma. She is 77% compliant with CPAP  use greater than 4 hours over the last 30 days. Current pressure 5 to 20 cm H2O. She has lost 90 lbs in the last year and would like to see if she can come off CPAP. On nights she does not wear CPAP she does not wake up gasping/choking. Denies excessive daytime sleepiness.   Airview download 05/05/2023 - 06/03/2023 Usage days 23 days (77%) greater than 4 hours Average usage days used 6 hours 24 minutes Pressure 5 to 20 cm H2O (7.9 cm H2O-95%) Air leaks 15.5 L/min (95%) AHI 1.0  No Known  Allergies  Immunization History  Administered Date(s) Administered   DTaP 05/01/2009   Influenza Inj Mdck Quad Pf 06/15/2022   Influenza Split 06/04/2013   Influenza,inj,Quad PF,6+ Mos 06/04/2014, 07/01/2020   Influenza-Unspecified 07/07/1999, 06/15/2021, 07/08/2021   Moderna Sars-Covid-2 Vaccination 06/15/2022   PFIZER(Purple Top)SARS-COV-2 Vaccination 11/19/2019, 12/12/2019   Td 09/04/2004, 02/17/2020   Tdap 05/01/2009   Unspecified SARS-COV-2 Vaccination 11/19/2019, 12/12/2019, 06/10/2020, 07/08/2021   Zoster Recombinant(Shingrix) 06/15/2022, 11/13/2022    Past Medical History:  Diagnosis Date   Allergic rhinitis    Anemia    Heavy menstrual flow/OTC FE BID:     Asthma    Family history of adverse reaction to anesthesia    Dad's heart stopped after surgery   Fibroids    GERD (gastroesophageal reflux disease)    Heart palpitations    History of kidney stones    Hyperlipidemia    Migraines    OSA (obstructive sleep apnea) 05/06/2014   Pneumonia    Rheumatoid arthritis (HCC)     Tobacco History: Social History   Tobacco Use  Smoking Status Never   Passive exposure: Never  Smokeless Tobacco Former   Types: Snuff   Quit date: 2021   Counseling given: Not Answered   Outpatient Medications Prior to Visit  Medication Sig Dispense Refill   albuterol (VENTOLIN HFA) 108 (90 Base) MCG/ACT inhaler Inhale 2 puffs into the lungs every 6 (six) hours as needed for wheezing or shortness of breath. 6.7 g 0   B Complex-C (B-COMPLEX WITH VITAMIN C) tablet Take 1 tablet by mouth daily.     budesonide-formoterol (SYMBICORT) 80-4.5 MCG/ACT inhaler Inhale 2 puffs into the lungs in the morning and at bedtime. 1 each 5   cetirizine (ZYRTEC) 10 MG chewable tablet Chew by mouth.     Cholecalciferol (VITAMIN D) 125 MCG (5000 UT) CAPS Take 5,000 Units by mouth daily.     fluticasone (FLONASE) 50 MCG/ACT nasal spray Place 2 sprays into both nostrils daily. 16 g 6   folic acid (FOLVITE) 1  MG tablet Take 1 mg by mouth daily.     golimumab (SIMPONI ARIA) 50 MG/4ML SOLN injection Inject 50 mg into the vein every 8 (eight) weeks. Dose may change due to weight     methotrexate 2.5 MG tablet Take 20 mg by mouth once a week. Saturday (Taking 8 tabs weekly)     montelukast (SINGULAIR) 10 MG tablet Take by mouth.     pantoprazole (PROTONIX) 40 MG tablet Take 1 tablet (40 mg total) by mouth daily. Take this medication daily, regardless of reflux symptoms 90 tablet 0   rosuvastatin (CRESTOR) 5 MG tablet TAKE 1 TABLET(5 MG) BY MOUTH AT BEDTIME (Patient taking differently: Take 5 mg by mouth daily.) 90 tablet 3   SUMAtriptan (IMITREX) 100 MG tablet TAKE 1 TAB AT ONSET OF MIGRAINE REPEAT IN 2 HRS IF NEEDED 10 tablet 3   traZODone (DESYREL) 50 MG tablet  Take 0.5-1 tablets (25-50 mg total) by mouth at bedtime as needed for sleep. 90 tablet 0   No facility-administered medications prior to visit.   Review of Systems  Review of Systems  Constitutional:  Negative for fatigue.  HENT: Negative.    Respiratory:  Negative for apnea, cough, shortness of breath and wheezing.      Physical Exam  LMP 10/24/2014  Physical Exam Constitutional:      Appearance: Normal appearance.  HENT:     Head: Normocephalic and atraumatic.  Cardiovascular:     Rate and Rhythm: Normal rate and regular rhythm.  Pulmonary:     Effort: Pulmonary effort is normal.     Breath sounds: Normal breath sounds. No wheezing, rhonchi or rales.  Neurological:     General: No focal deficit present.     Mental Status: She is alert and oriented to person, place, and time. Mental status is at baseline.  Psychiatric:        Mood and Affect: Mood normal.        Behavior: Behavior normal.        Thought Content: Thought content normal.        Judgment: Judgment normal.      Lab Results:  CBC    Component Value Date/Time   WBC 12.1 (H) 05/08/2023 1003   RBC 4.94 05/08/2023 1003   HGB 15.5 (H) 05/08/2023 1003   HCT  48.1 (H) 05/08/2023 1003   PLT 367.0 05/08/2023 1003   MCV 97.3 05/08/2023 1003   MCH 30.6 07/18/2022 0414   MCHC 32.2 05/08/2023 1003   RDW 14.1 05/08/2023 1003   LYMPHSABS 4.6 (H) 04/20/2023 1359   MONOABS 0.7 04/20/2023 1359   EOSABS 0.3 04/20/2023 1359   BASOSABS 0.1 04/20/2023 1359    BMET    Component Value Date/Time   NA 139 05/09/2023 0838   K 4.7 05/09/2023 0838   CL 102 05/09/2023 0838   CO2 29 05/09/2023 0838   GLUCOSE 82 05/09/2023 0838   BUN 20 05/09/2023 0838   CREATININE 0.78 05/09/2023 0838   CALCIUM 9.7 05/09/2023 0838   GFRNONAA >60 07/18/2022 0414   GFRAA >60 11/14/2017 0127    BNP No results found for: "BNP"  ProBNP No results found for: "PROBNP"  Imaging: XR Hand Complete Right  Result Date: 05/14/2023 X-rays of the right hand, multiple views were obtained today X-rays demonstrate right small finger metacarpal neck fracture with approximately 50 degrees of angulation.  No other significant bony abnormalities appreciated.  DG Orbits  Result Date: 05/09/2023 CLINICAL DATA:  fall causing trauma or right orbit EXAM: ORBITS - COMPLETE 4+ VIEW COMPARISON:  None Available. FINDINGS: There is no evidence of fracture or other significant bone abnormality. No orbital emphysema or sinus air-fluid levels are seen. Mild cervical degenerative disc disease C4-C7. IMPRESSION: No acute findings Electronically Signed   By: Corlis Leak M.D.   On: 05/09/2023 10:23   DG Chest 2 View  Result Date: 05/09/2023 CLINICAL DATA:  elevated white blood cell count. EXAM: CHEST - 2 VIEW COMPARISON:  02/20/2022 FINDINGS: Lungs are clear. Heart size and mediastinal contours are within normal limits. No effusion. Visualized bones unremarkable.  Surgical clips left upper abdomen. IMPRESSION: No acute findings Electronically Signed   By: Corlis Leak M.D.   On: 05/09/2023 09:49   DG Hand Complete Right  Result Date: 05/09/2023 CLINICAL DATA:  right hand pain after fall EXAM: RIGHT HAND -  COMPLETE 3+ VIEW COMPARISON:  07/04/2020 FINDINGS:  Minimally comminuted transverse fracture of the distal shaft fifth metacarpal, with mild palmar angulation of distal fracture fragment. No definite intra-articular extension. Overlying soft tissue swelling. Otherwise normal mineralization and alignment. No significant osseous degenerative change. IMPRESSION: Minimally comminuted, mildly angulated fracture of the distal shaft fifth metacarpal. Electronically Signed   By: Corlis Leak M.D.   On: 05/09/2023 09:46     Assessment & Plan:   1. OSA (obstructive sleep apnea) (Primary) - Home sleep test; Future  Sleep apnea Patient is 77% compliant with CPAP use greater than 4 hours over the last 30 days. Current pressure 5 to 20 cm H2O. She has lost 90 lbs over the last year and would like to see if she can come off PAP therapy. Repeat HST to re-assess OSA due to weight loss.   Asthma Well controlled; No acute respiratory complaints or recent flare ups No SABA use. No longer taking symbicort  ACT score 24  Glenford Bayley, NP 06/05/2023

## 2023-06-11 ENCOUNTER — Ambulatory Visit: Payer: BC Managed Care – PPO | Admitting: Orthopedic Surgery

## 2023-06-11 ENCOUNTER — Other Ambulatory Visit (INDEPENDENT_AMBULATORY_CARE_PROVIDER_SITE_OTHER): Payer: Self-pay

## 2023-06-11 DIAGNOSIS — S6291XD Unspecified fracture of right wrist and hand, subsequent encounter for fracture with routine healing: Secondary | ICD-10-CM

## 2023-06-11 NOTE — Progress Notes (Signed)
Katherine Rosario - 52 y.o. female MRN [id number]  Date of birth: 06-16-1971  Office Visit Note: Visit Date: 06/11/2023 PCP: Katherine Frees, NP Referred by: Katherine Frees, NP  Subjective: No chief complaint on file.  HPI: Katherine Rosario is a pleasant 52 y.o. female who presents today for follow-up for a right small finger metacarpal neck fracture sustained approximately 4 weeks prior.  She is doing well, pain is well-controlled, range of motion is improving.  Pertinent ROS were reviewed with the patient and found to be negative unless otherwise specified above in HPI.    Assessment & Plan: Visit Diagnoses:  1. Closed fracture of right hand with routine healing, subsequent encounter     Plan: Repeat x-ray was taken today which confirms stable appearance of the right small finger metacarpal neck fracture with interval healing and callus formation.  Clinically, her pain is well-controlled and her range of motion is excellent, she is able to form a composite fist without restriction without rotation abnormality.  I have recommended that she continue utilizing the removable ulnar gutter brace for an additional 2 weeks to allow for further healing, at that juncture she can begin activities outside of the brace.  I will plan on seeing her back approximately 4 weeks for clinical and radiographic recheck.  Follow-up: No follow-ups on file.   Meds & Orders: No orders of the defined types were placed in this encounter.   Orders Placed This Encounter  Procedures   XR Hand Complete Right     Procedures: No procedures performed      Clinical History: No specialty comments available.  She reports that she has never smoked. She has never been exposed to tobacco smoke. She quit smokeless tobacco use about 3 years ago.  Her smokeless tobacco use included snuff. No results for input(s): "HGBA1C", "LABURIC" in the last 8760 hours.  Objective:   Vital Signs: LMP 10/24/2014   Physical Exam   Gen: Well-appearing, in no acute distress; non-toxic CV: Regular Rate. Well-perfused. Warm.  Resp: Breathing unlabored on room air; no wheezing. Psych: Fluid speech in conversation; appropriate affect; normal thought process  Ortho Exam Right hand: - Minimal tenderness to palpation at the metacarpal neck - No rotational abnormality, able to perform range of motion with flexion of digits, no significant overlap appreciated, normal cascade to the digits, composite fist - Sensation intact distally, hand is warm well-perfused  Imaging: XR Hand Complete Right  Result Date: 06/11/2023 X-rays of the right hand, multiple views were obtained today X-rays demonstrate interval healing of the right small finger metacarpal neck fracture with approximately 50 degrees of angulation, there is callus formation seen on multiple views.  MCP joint remains well located in all planes.   Past Medical/Family/Surgical/Social History: Medications & Allergies reviewed per EMR, new medications updated. Patient Active Problem List   Diagnosis Date Noted   Morbid obesity (HCC) 07/17/2022   Rheumatoid arthritis (HCC) 06/16/2022   Dysfunction of left eustachian tube 03/12/2017   Bilateral edema of lower extremity 09/01/2015   Migraine 04/27/2015   Palpitations 06/23/2014   Abnormal ECG 06/23/2014   Syncope 06/23/2014   Orthostatic hypotension 06/23/2014   OSA (obstructive sleep apnea) 05/06/2014   Other malaise and fatigue 05/02/2014   Nonspecific elevation of levels of transaminase or lactic acid dehydrogenase (LDH) 05/02/2014   Acute upper respiratory infection 11/19/2013   Morbid (severe) obesity due to excess calories (HCC) 08/25/2013   Allergic rhinitis 08/25/2013   Anemia 08/23/2012   Heart  palpitations    Asthma    GERD (gastroesophageal reflux disease)    Hyperlipidemia    Intractable migraine without aura 01/18/1999   Headache 12/24/1998   Past Medical History:  Diagnosis Date   Allergic  rhinitis    Anemia    Heavy menstrual flow/OTC FE BID:     Asthma    Family history of adverse reaction to anesthesia    Dad's heart stopped after surgery   Fibroids    GERD (gastroesophageal reflux disease)    Heart palpitations    History of kidney stones    Hyperlipidemia    Migraines    OSA (obstructive sleep apnea) 05/06/2014   Pneumonia    Rheumatoid arthritis (HCC)    Family History  Problem Relation Age of Onset   Hyperlipidemia Mother    Hypertension Mother    Colon cancer Father 22   Hyperlipidemia Father    Hypertension Father    Prostate cancer Father 36   Heart attack Neg Hx    Stroke Neg Hx    Past Surgical History:  Procedure Laterality Date   LAPAROSCOPIC GASTRIC SLEEVE RESECTION N/A 07/17/2022   Procedure: LAPAROSCOPIC SLEEVE GASTRECTOMY;  Surgeon: Berna Bue, MD;  Location: [facility];  Service: General;  Laterality: N/A;   UPPER GI ENDOSCOPY N/A 07/17/2022   Procedure: UPPER GI ENDOSCOPY;  Surgeon: Berna Bue, MD;  Location: [facility];  Service: General;  Laterality: N/A;   UTERINE FIBROID EMBOLIZATION     WISDOM TOOTH EXTRACTION     Social History   Occupational History   Occupation: Clinical research associate: [employer]  Tobacco Use   Smoking status: Never    Passive exposure: Never   Smokeless tobacco: Former    Types: Snuff    Quit date: 2021  Vaping Use   Vaping status: Never Used  Substance and Sexual Activity   Alcohol use: Yes    Alcohol/week: 0.0 standard drinks of alcohol    Comment: occ   Drug use: No   Sexual activity: Never    Katherine Rosario) Katherine Rosario, M.D. [facility] 4:01 PM

## 2023-06-20 ENCOUNTER — Encounter: Payer: Self-pay | Admitting: Adult Health

## 2023-06-20 NOTE — Telephone Encounter (Signed)
Please advise 

## 2023-06-27 ENCOUNTER — Emergency Department (HOSPITAL_BASED_OUTPATIENT_CLINIC_OR_DEPARTMENT_OTHER)
Admission: EM | Admit: 2023-06-27 | Discharge: 2023-06-27 | Disposition: A | Discharge status: Home / Self Care | Payer: BC Managed Care – PPO | Attending: Emergency Medicine | Admitting: Emergency Medicine

## 2023-06-27 ENCOUNTER — Emergency Department (HOSPITAL_BASED_OUTPATIENT_CLINIC_OR_DEPARTMENT_OTHER): Payer: BC Managed Care – PPO

## 2023-06-27 ENCOUNTER — Encounter (HOSPITAL_BASED_OUTPATIENT_CLINIC_OR_DEPARTMENT_OTHER): Payer: Self-pay

## 2023-06-27 DIAGNOSIS — I951 Orthostatic hypotension: Secondary | ICD-10-CM | POA: Diagnosis not present

## 2023-06-27 DIAGNOSIS — R002 Palpitations: Secondary | ICD-10-CM | POA: Diagnosis not present

## 2023-06-27 DIAGNOSIS — R55 Syncope and collapse: Secondary | ICD-10-CM | POA: Diagnosis not present

## 2023-06-27 LAB — CBC
HCT: 41.6 % (ref 36.0–46.0)
Hemoglobin: 13.5 g/dL (ref 12.0–15.0)
MCH: 31.4 pg (ref 26.0–34.0)
MCHC: 32.5 g/dL (ref 30.0–36.0)
MCV: 96.7 fL (ref 80.0–100.0)
Platelets: 315 10*3/uL (ref 150–400)
RBC: 4.3 MIL/uL (ref 3.87–5.11)
RDW: 13.5 % (ref 11.5–15.5)
WBC: 6.4 10*3/uL (ref 4.0–10.5)
nRBC: 0 % (ref 0.0–0.2)

## 2023-06-27 LAB — BASIC METABOLIC PANEL
Anion gap: 10 (ref 5–15)
BUN: 15 mg/dL (ref 6–20)
CO2: 25 mmol/L (ref 22–32)
Calcium: 9 mg/dL (ref 8.9–10.3)
Chloride: 104 mmol/L (ref 98–111)
Creatinine, Ser: 0.7 mg/dL (ref 0.44–1.00)
GFR, Estimated: >60 mL/min (ref >60)
Glucose, Bld: 97 mg/dL (ref 70–99)
Potassium: 3.7 mmol/L (ref 3.5–5.1)
Sodium: 139 mmol/L (ref 135–145)

## 2023-06-27 LAB — URINALYSIS, ROUTINE W REFLEX MICROSCOPIC
Bilirubin Urine: NEGATIVE
Glucose, UA: NEGATIVE mg/dL
Hgb urine dipstick: NEGATIVE
Ketones, ur: NEGATIVE mg/dL
Nitrite: NEGATIVE
Protein, ur: NEGATIVE mg/dL
Specific Gravity, Urine: 1.01 (ref 1.005–1.030)
pH: 6.5 (ref 5.0–8.0)

## 2023-06-27 LAB — MAGNESIUM: Magnesium: 1.9 mg/dL (ref 1.7–2.4)

## 2023-06-27 LAB — URINALYSIS, MICROSCOPIC (REFLEX)

## 2023-06-27 LAB — BRAIN NATRIURETIC PEPTIDE: B Natriuretic Peptide: 9.4 pg/mL (ref 0.0–100.0)

## 2023-06-27 LAB — TROPONIN I (HIGH SENSITIVITY): Troponin I (High Sensitivity): <2 ng/L (ref <18)

## 2023-06-27 LAB — CBG MONITORING, ED: Glucose-Capillary: 98 mg/dL (ref 70–99)

## 2023-06-27 LAB — TSH: TSH: 1.075 u[IU]/mL (ref 0.350–4.500)

## 2023-06-27 NOTE — ED Notes (Signed)
Discharge paperwork reviewed entirely with patient, including follow up care. Pain was under control. No prescriptions were called in, but all questions were addressed.  Pt verbalized understanding as well as all parties involved. No questions or concerns voiced at the time of discharge. No acute distress noted.   Pt ambulated out to PVA without incident or assistance.  Pt advised they will notify their PCP.  and Pt advised they will seek followup care with a specialist and notify their PCP.

## 2023-06-27 NOTE — ED Notes (Signed)
Pt states she drove herself here.. and has been driving since this started

## 2023-06-27 NOTE — ED Notes (Signed)
XR at bedside

## 2023-06-27 NOTE — Discharge Instructions (Addendum)
You were seen for your palpitations and passing out in the emergency department.   At home, please stay well-hydrated.    Follow-up with your primary doctor in 2-3 days regarding your visit.  Cardiology will be calling you regarding an appointment within the next 72 hours.  You may contact them if you do not hear from them in that time using the information in this packet.  Please talk to them to see if you need an outpatient (Holter) monitor.  Return immediately to the emergency department if you experience any of the following: Chest pain, shortness of breath, fainting, or any other concerning symptoms.    Thank you for visiting our Emergency Department. It was a pleasure taking care of you today.

## 2023-06-27 NOTE — ED Provider Notes (Signed)
[place] [facility] Provider Note   CSN: [id number] Arrival date & time: 06/27/23  1324     History  Chief Complaint  Patient presents with   Loss of Consciousness    Katherine HANCHEY is a 52 y.o. female.  52 year old female with history of rheumatoid arthritis on methotrexate and palpitations who presents emergency department with syncope.  Patient reports that on Labor Day she stood up got lightheaded and passed out.  Says that this happened on Saturday of this week as well.  On Saturday she stood up and felt lightheaded but did not completely lose consciousness.  Says that she has been having palpitations multiple times a day but not typically preceding the syncopal events.  No chest pain or shortness of breath prior to this.  Has a cardiology appointment in November but wanted to be evaluated emergently.  No new medication changes.  Says that she has been staying well-hydrated.  In 2015 was seen by cardiology for orthostatic syncope and palpitations.  Was on atenolol but it was stopped due to her syncopal events.  Also had an echo at that time that showed normal EF without any other abnormalities.       Home Medications Prior to Admission medications   Medication Sig Start Date End Date Taking? Authorizing Provider  albuterol (VENTOLIN HFA) 108 (90 Base) MCG/ACT inhaler Inhale 2 puffs into the lungs every 6 (six) hours as needed for wheezing or shortness of breath. 08/22/22   Nafziger, Kandee Keen, NP  B Complex-C (B-COMPLEX WITH VITAMIN C) tablet Take 1 tablet by mouth daily.    [provider]  cetirizine (ZYRTEC) 10 MG chewable tablet Chew by mouth. 03/12/17   [provider]  Cholecalciferol (VITAMIN D) 125 MCG (5000 UT) CAPS Take 5,000 Units by mouth daily.    [provider]  fluticasone (FLONASE) 50 MCG/ACT nasal spray Place 2 sprays into both nostrils daily. 11/29/22   Nafziger, Kandee Keen, NP  folic acid (FOLVITE) 1 MG  tablet Take 1 mg by mouth daily.    [provider]  golimumab (SIMPONI ARIA) 50 MG/4ML SOLN injection Inject 50 mg into the vein every 8 (eight) weeks. Dose may change due to weight 02/28/22   [provider]  methotrexate 2.5 MG tablet Take 20 mg by mouth once a week. Saturday (Taking 8 tabs weekly)    [provider]  pantoprazole (PROTONIX) 40 MG tablet Take 1 tablet (40 mg total) by mouth daily. Take this medication daily, regardless of reflux symptoms 07/18/22   Berna Bue, MD  rosuvastatin (CRESTOR) 5 MG tablet TAKE 1 TABLET(5 MG) BY MOUTH AT BEDTIME Patient taking differently: Take 5 mg by mouth daily. 04/06/22   Nafziger, Kandee Keen, NP  SUMAtriptan (IMITREX) 100 MG tablet TAKE 1 TAB AT ONSET OF MIGRAINE REPEAT IN 2 HRS IF NEEDED 04/20/23   Shirline Frees, NP      Allergies    Patient has no known allergies.    Review of Systems   Review of Systems  Physical Exam Updated Vital Signs BP 117/80   Pulse 62   Temp 97.8 F (36.6 C) (Oral)   Resp 16   Ht 5\' 1"  (1.549 m)   Wt 59 kg   LMP 10/24/2014   SpO2 97%   BMI 24.56 kg/m  Physical Exam Vitals and nursing note reviewed.  Constitutional:      General: She is not in acute distress.    Appearance: She is well-developed.  HENT:     Head: Normocephalic and atraumatic.     Right Ear: External ear normal.     Left Ear: External ear normal.     Nose: Nose normal.  Eyes:     Extraocular Movements: Extraocular movements intact.     Conjunctiva/sclera: Conjunctivae normal.     Pupils: Pupils are equal, round, and reactive to light.  Cardiovascular:     Rate and Rhythm: Normal rate and regular rhythm.     Heart sounds: No murmur heard. Pulmonary:     Effort: Pulmonary effort is normal. No respiratory distress.     Breath sounds: Normal breath sounds.  Abdominal:     General: Abdomen is flat. There is no distension.     Palpations: Abdomen is soft. There is no mass.     Tenderness: There is no  abdominal tenderness. There is no guarding.  Musculoskeletal:     Cervical back: Normal range of motion and neck supple.     Right lower leg: No edema.     Left lower leg: No edema.  Skin:    General: Skin is warm and dry.  Neurological:     Mental Status: She is alert and oriented to person, place, and time. Mental status is at baseline.  Psychiatric:        Mood and Affect: Mood normal.     ED Results / Procedures / Treatments   Labs (all labs ordered are listed, but only abnormal results are displayed) Labs Reviewed  URINALYSIS, ROUTINE W REFLEX MICROSCOPIC - Abnormal; Notable for the following components:      Result Value   Leukocytes,Ua TRACE (*)    All other components within normal limits  URINALYSIS, MICROSCOPIC (REFLEX) - Abnormal; Notable for the following components:   Bacteria, UA RARE (*)    All other components within normal limits  BASIC METABOLIC PANEL  CBC  TSH  MAGNESIUM  BRAIN NATRIURETIC PEPTIDE  CBG MONITORING, ED  TROPONIN I (HIGH SENSITIVITY)    EKG EKG Interpretation Date/Time:  Wednesday June 27 2023 07:46:32 EDT Ventricular Rate:  72 PR Interval:  174 QRS Duration:  81 QT Interval:  375 QTC Calculation: 411 R Axis:   60  Text Interpretation: Sinus rhythm Low voltage, precordial leads Probable anteroseptal infarct, old Nonspecific ST abnormality Confirmed by Vonita Moss [phone]) on 06/27/2023 7:49:11 AM  Radiology DG Chest Portable 1 View  Result Date: 06/27/2023 CLINICAL DATA:  Syncope EXAM: PORTABLE CHEST 1 VIEW COMPARISON:  05/09/2023 FINDINGS: Hyperinflation. No consolidation, pneumothorax or effusion. No edema. Normal cardiopericardial silhouette. IMPRESSION: Hyperinflation.  No acute cardiopulmonary disease. Electronically Signed   By: Karen Kays M.D.   On: 06/27/2023 10:50    Procedures Procedures    Medications Ordered in ED Medications - No data to display  ED Course/ Medical Decision Making/ A&P                                  Medical Decision Making Amount and/or Complexity of Data Reviewed Labs: ordered. Radiology: ordered.   Katherine Rosario is a 52 y.o. female with comorbidities that complicate the patient evaluation including rheumatoid arthritis, orthostatic syncope, palpitations who presents to the emergency department with syncope  Initial Ddx:  Orthostatic syncope, arrhythmia, MI, CHF  MDM/Course:  Patient presents to the emergency department with syncopal event.  In 2015 did have recurrent syncope as well which was review dated to her atenolol.  Does not appear to be on any medications that would be causing orthostasis at this time.  Vital signs are all reassuring.  Had orthostatic vitals that were normal.  EKG without any concerning findings such as Brugada, long QT, or WPW.  Her labs including CBC and troponin were WNL.  Upon re-evaluation patient remained stable.  No significant signs of trauma on exam that would warrant additional imaging at this time.  Did place a cardiology referral given the frequency of these events to see if they can arrange her for Holter monitor and possible echo.  This patient presents to the ED for concern of complaints listed in HPI, this involves an extensive number of treatment options, and is a complaint that carries with it a high risk of complications and morbidity. Disposition including potential need for admission considered.   Dispo: DC Home. Return precautions discussed including, but not limited to, those listed in the AVS. Allowed pt time to ask questions which were answered fully prior to dc.  Records reviewed Outpatient Clinic Notes The following labs were independently interpreted: Chemistry and show no acute abnormality I independently reviewed the following imaging with scope of interpretation limited to determining acute life threatening conditions related to emergency care: Chest x-ray and agree with the radiologist interpretation with the  following exceptions: none I personally reviewed and interpreted cardiac monitoring: normal sinus rhythm  I personally reviewed and interpreted the pt's EKG: see above for interpretation  I have reviewed the patients home medications and made adjustments as needed   Portions of this note were generated with Dragon dictation software. Dictation errors may occur despite best attempts at proofreading.           Final Clinical Impression(s) / ED Diagnoses Final diagnoses:  Orthostatic syncope  Palpitations    Rx / DC Orders ED Discharge Orders          Ordered    Ambulatory referral to Cardiology        06/27/23 1228              Rondel Baton, MD 06/27/23 2002

## 2023-06-27 NOTE — ED Triage Notes (Signed)
States "keeps passing out" intermittently since Labor Day. States last episode was Sunday where she felt lightheaded but didn't lose  consciousness. States has palpitations. Not associated with anything in particular.

## 2023-07-02 DIAGNOSIS — Z79899 Other long term (current) drug therapy: Secondary | ICD-10-CM | POA: Diagnosis not present

## 2023-07-02 DIAGNOSIS — R768 Other specified abnormal immunological findings in serum: Secondary | ICD-10-CM | POA: Diagnosis not present

## 2023-07-02 DIAGNOSIS — M0609 Rheumatoid arthritis without rheumatoid factor, multiple sites: Secondary | ICD-10-CM | POA: Diagnosis not present

## 2023-07-02 DIAGNOSIS — M1990 Unspecified osteoarthritis, unspecified site: Secondary | ICD-10-CM | POA: Diagnosis not present

## 2023-07-09 ENCOUNTER — Encounter (INDEPENDENT_AMBULATORY_CARE_PROVIDER_SITE_OTHER): Payer: BC Managed Care – PPO

## 2023-07-09 DIAGNOSIS — G4733 Obstructive sleep apnea (adult) (pediatric): Secondary | ICD-10-CM

## 2023-07-09 DIAGNOSIS — G4730 Sleep apnea, unspecified: Secondary | ICD-10-CM | POA: Diagnosis not present

## 2023-07-12 ENCOUNTER — Encounter: Payer: BC Managed Care – PPO | Attending: Surgery | Admitting: Dietician

## 2023-07-12 ENCOUNTER — Encounter: Payer: Self-pay | Admitting: Dietician

## 2023-07-12 VITALS — Ht 61.0 in | Wt 135.6 lb

## 2023-07-12 DIAGNOSIS — E669 Obesity, unspecified: Secondary | ICD-10-CM | POA: Diagnosis not present

## 2023-07-12 NOTE — Progress Notes (Signed)
Bariatric Nutrition Follow-Up Visit Medical Nutrition Therapy   Surgery date: 07/17/22 Surgery type: Sleeve  NUTRITION ASSESSMENT  Anthropometrics  Start weight at [facility]: 221.3 lbs (date: 05/16/2022)  Height: 61 in Weight today: 135.6 lbs.   Clinical  Medical hx: Asthma, sleep apnea, obesity, GERD, anemia Medications: see list   Labs: glucose 103; lipid panel WNL Notable signs/symptoms: none noted Any previous deficiencies? No Bowel Habits: Every day to every other day no complaints   Body Composition Scale 08/01/22 09/12/2022 12/12/2022 07/12/2023  Current Body Weight 201.3 186.5 161.8 135.6  Total Body Fat % 43.0 41.1 36.7 30.4  Visceral Fat 15 13 10 7   Fat-Free Mass % 56.9 58.8 63.2 69.5   Total Body Water % 42.9 43.9 46.1 49.2  Muscle-Mass lbs 27.8 27.4 27.3 27.0  BMI 38.1 35.2 30.5 25.5  Body Fat Displacement             Torso  lbs 53.6 47.5 36.7 25.4         Left Leg  lbs 10.7 9.5 7.3 5.0         Right Leg  lbs 10.7 9.5 7.3 5.0         Left Arm  lbs 5.3 4.7 3.6 2.5         Right Arm   lbs 5.3 4.7 3.6 2.5     Lifestyle & Dietary Hx  Pt states she has been taken off her statin, stating she will be re-evaluated in 6 months to be sure. Pt states she is at her goal weight, stating she dropped below and didn't like the way she looked. Pt states she switched to chicken thighs from chicken breast. Pt states she is doing well and feels good, stating she blacked out on Labor day. Pt states the cause has not been determined. Pt states she has been referred to cardiology. Pt states she has been using light weights and resistance bands some, stating not as much as she would like to.  Estimated daily fluid intake: 80-96 oz Estimated daily protein intake: 60-90 g Supplements: multivitamin and calcium Current average weekly physical activity: ADLs, walking the dogs about 40 minutes a day.   24-Hr Dietary Recall First Meal: spinach and feta egg bites with tomatoes and bacon  bits Snack:   Second Meal: left overs (chicken breast and spinach) Snack:  bought some cashews or fairlife chocolate protein shake Third Meal: Chicken breast or cauliflower crust spinach pizza. Snack:  Beverages: water, Gatorade zero, water with flavorings (half strength)  Post-Op Goals/ Signs/ Symptoms Using straws: no Drinking while eating: no Chewing/swallowing difficulties: no Changes in vision: no Changes to mood/headaches: no Hair loss/changes to skin/nails: no Difficulty focusing/concentrating: no Sweating: no Limb weakness: no Dizziness/lightheadedness: no Palpitations: no  Carbonated/caffeinated beverages: no N/V/D/C/Gas: no Abdominal pain: no Dumping syndrome: no   NUTRITION DIAGNOSIS  Overweight/obesity ([place]-3.3) related to past poor dietary habits and physical inactivity as evidenced by completed bariatric surgery and following dietary guidelines for continued weight loss and healthy nutrition status.   NUTRITION INTERVENTION Nutrition counseling (C-1) and education (E-2) to facilitate bariatric surgery goals, including: The importance of consuming adequate calories as well as certain nutrients daily due to the body's need for essential vitamins, minerals, and fats The importance of daily physical activity and to reach a goal of at least 150 minutes of moderate to vigorous physical activity weekly (or as directed by their physician) due to benefits such as increased musculature and improved lab values The importance of intuitive  eating specifically learning hunger-satiety cues and understanding the importance of learning a new body: The importance of mindful eating to avoid grazing behaviors  Encouraged patient to honor their body's internal hunger and fullness cues.  Throughout the day, check in mentally and rate hunger. Stop eating when satisfied not full regardless of how much food is left on the plate.  Get more if still hungry 20-30 minutes later.  The key is to honor  satisfaction so throughout the meal, rate fullness factor and stop when comfortably satisfied not physically full. The key is to honor hunger and fullness without any feelings of guilt or shame.  Pay attention to what the internal cues are, rather than any external factors. This will enhance the confidence you have in listening to your own body and following those internal cues enabling you to increase how often you eat when you are hungry not out of appetite and stop when you are satisfied not full.  Encouraged pt to continue to eat balanced meals inclusive of non starchy vegetables 2 times a day 7 days a week Encouraged pt to choose lean protein sources: limiting beef, pork, sausage, hotdogs, and lunch meat Encourage pt to choose healthy fats such as plant based limiting animal fats Encouraged pt to continue to drink a minium 64 fluid ounces with half being plain water to satisfy proper hydration   Why you need complex carbohydrates: Whole grains and other complex carbohydrates are required to have a healthy diet. Whole grains provide fiber which can help with blood glucose levels and help keep you satiated. Fruits and starchy vegetables provide essential vitamins and minerals required for immune function, eyesight support, brain support, bone density, wound healing and many other functions within the body. According to the current evidenced based 2020-2025 Dietary Guidelines for Americans, complex carbohydrates are part of a healthy eating pattern which is associated with a decreased risk for type 2 diabetes, cancers, and cardiovascular disease.   Goals -Continue adding more resistance exercises to your physical activity. -Aim for at least 50 grams of carbohydrates from whole grains, whole fruit and whole starchy vegetables.  Handouts Provided Include  Bariatric MyPlate  Learning Style & Readiness for Change Teaching method utilized: Visual & Auditory  Demonstrated degree of understanding via:  Teach Back  Readiness Level: Action Barriers to learning/adherence to lifestyle change: nothing identified   RD's Notes for Next Visit Assess adherence to pt chosen goals  MONITORING & EVALUATION Dietary intake, weekly physical activity, body weight.  Next Steps Patient is to follow-up in 6 months. Pt agreeable to call for a sooner appointment as needed.

## 2023-07-13 ENCOUNTER — Telehealth: Payer: Self-pay | Admitting: Pulmonary Disease

## 2023-07-13 DIAGNOSIS — G4733 Obstructive sleep apnea (adult) (pediatric): Secondary | ICD-10-CM | POA: Diagnosis not present

## 2023-07-13 NOTE — Telephone Encounter (Signed)
Call patient  Sleep study result  Date of study: 07/09/2023  Impression: Mild obstructive sleep apnea with moderate oxygen desaturations  Recommendation: Options of treatment for mild obstructive sleep apnea will include  1.  CPAP therapy if there is significant daytime sleepiness or other comorbidities including history of CVA or cardiac disease -If CPAP is chosen as an option of treatment auto titrating CPAP with a pressure setting of 5-15 will be appropriate  2.  Watchful waiting with emphasis on weight loss measures, sleep position modification to optimize lateral sleep, elevating the head of the bed by about 30 degrees may also help.  3.  An oral device may be fashioned for the treatment of mild sleep disordered breathing, will involve referral to dentist.   Follow-up as previously scheduled

## 2023-07-16 ENCOUNTER — Ambulatory Visit: Payer: BC Managed Care – PPO | Admitting: Orthopedic Surgery

## 2023-07-16 ENCOUNTER — Other Ambulatory Visit (INDEPENDENT_AMBULATORY_CARE_PROVIDER_SITE_OTHER): Payer: Self-pay

## 2023-07-16 DIAGNOSIS — S6291XD Unspecified fracture of right wrist and hand, subsequent encounter for fracture with routine healing: Secondary | ICD-10-CM | POA: Diagnosis not present

## 2023-07-16 NOTE — Progress Notes (Unsigned)
OSHUN PERRILLOUX - 52 y.o. female MRN [id number]  Date of birth: 11/05/1970  Office Visit Note: Visit Date: 07/16/2023 PCP: Shirline Frees, NP Referred by: Shirline Frees, NP  Subjective: No chief complaint on file.  HPI: CYNTIA BESSON is a pleasant 52 y.o. female who presents today for follow-up for a right small finger metacarpal neck fracture sustained approximately 7 weeks prior.  She is doing well, pain is well-controlled, range of motion is improving.  Pertinent ROS were reviewed with the patient and found to be negative unless otherwise specified above in HPI.    Assessment & Plan: Visit Diagnoses:  1. Closed fracture of right hand with routine healing, subsequent encounter     Plan: Repeat x-ray was taken today which confirms stable appearance of the right small finger metacarpal neck fracture with appropriate healing and callus formation.  Clinically, her pain is well-controlled and her range of motion is excellent, she is able to form a composite fist without restriction without rotation abnormality.  She can resume activities as tolerated at this juncture.  She is welcome to return to me as needed moving forward.  Follow-up: No follow-ups on file.   Meds & Orders: No orders of the defined types were placed in this encounter.   Orders Placed This Encounter  Procedures   XR Hand Complete Right     Procedures: No procedures performed      Clinical History: No specialty comments available.  She reports that she has never smoked. She has never been exposed to tobacco smoke. She quit smokeless tobacco use about 3 years ago.  Her smokeless tobacco use included snuff. No results for input(s): "HGBA1C", "LABURIC" in the last 8760 hours.  Objective:   Vital Signs: LMP 10/24/2014   Physical Exam  Gen: Well-appearing, in no acute distress; non-toxic CV: Regular Rate. Well-perfused. Warm.  Resp: Breathing unlabored on room air; no wheezing. Psych: Fluid speech in  conversation; appropriate affect; normal thought process  Ortho Exam Right hand: - Minimal tenderness to palpation at the metacarpal neck - No rotational abnormality, able to perform range of motion with flexion of digits, no significant overlap appreciated, normal cascade to the digits, composite fist - Sensation intact distally, hand is warm well-perfused  Imaging: In chart  Past Medical/Family/Surgical/Social History: Medications & Allergies reviewed per EMR, new medications updated. Patient Active Problem List   Diagnosis Date Noted   Morbid obesity (HCC) 07/17/2022   Rheumatoid arthritis (HCC) 06/16/2022   Dysfunction of left eustachian tube 03/12/2017   Bilateral edema of lower extremity 09/01/2015   Migraine 04/27/2015   Palpitations 06/23/2014   Abnormal ECG 06/23/2014   Syncope 06/23/2014   Orthostatic hypotension 06/23/2014   OSA (obstructive sleep apnea) 05/06/2014   Other malaise and fatigue 05/02/2014   Nonspecific elevation of levels of transaminase or lactic acid dehydrogenase (LDH) 05/02/2014   Acute upper respiratory infection 11/19/2013   Morbid (severe) obesity due to excess calories (HCC) 08/25/2013   Allergic rhinitis 08/25/2013   Anemia 08/23/2012   Heart palpitations    Asthma    GERD (gastroesophageal reflux disease)    Hyperlipidemia    Intractable migraine without aura 01/18/1999   Headache 12/24/1998   Past Medical History:  Diagnosis Date   Allergic rhinitis    Anemia    Heavy menstrual flow/OTC FE BID:     Asthma    Family history of adverse reaction to anesthesia    Dad's heart stopped after surgery   Fibroids  GERD (gastroesophageal reflux disease)    Heart palpitations    History of kidney stones    Hyperlipidemia    Migraines    OSA (obstructive sleep apnea) 05/06/2014   Pneumonia    Rheumatoid arthritis (HCC)    Family History  Problem Relation Age of Onset   Hyperlipidemia Mother    Hypertension Mother    Colon cancer  Father 11   Hyperlipidemia Father    Hypertension Father    Prostate cancer Father 77   Heart attack Neg Hx    Stroke Neg Hx    Past Surgical History:  Procedure Laterality Date   LAPAROSCOPIC GASTRIC SLEEVE RESECTION N/A 07/17/2022   Procedure: LAPAROSCOPIC SLEEVE GASTRECTOMY;  Surgeon: Berna Bue, MD;  Location: [facility];  Service: General;  Laterality: N/A;   UPPER GI ENDOSCOPY N/A 07/17/2022   Procedure: UPPER GI ENDOSCOPY;  Surgeon: Berna Bue, MD;  Location: [facility];  Service: General;  Laterality: N/A;   UTERINE FIBROID EMBOLIZATION     WISDOM TOOTH EXTRACTION     Social History   Occupational History   Occupation: Clinical research associate: [employer]  Tobacco Use   Smoking status: Never    Passive exposure: Never   Smokeless tobacco: Former    Types: Snuff    Quit date: 2021  Vaping Use   Vaping status: Never Used  Substance and Sexual Activity   Alcohol use: Yes    Alcohol/week: 0.0 standard drinks of alcohol    Comment: occ   Drug use: No   Sexual activity: Never    Treasure Ochs Fara Boros) Denese Killings, M.D. [facility] 10:32 AM

## 2023-07-18 NOTE — Telephone Encounter (Signed)
ATC x1 LVM for patient to call our office back regarding sleep study result's. Posted result's on mychart.

## 2023-07-23 NOTE — Telephone Encounter (Signed)
Please try PT again. He has questions on study and tx plan. Her # is [phone] Call after 3:00. Need medical explanation,

## 2023-07-25 NOTE — Progress Notes (Signed)
  Cardiology Office Note:  .   Date:  07/27/2023  ID:  Katherine Rosario, DOB May 06, 1971, MRN [id number] PCP: Shirline Frees, NP  [facility] Providers Cardiologist:  Jonnie Finner to update primary MD,subspecialty MD or APP then REFRESH:1}   History of Present Illness: Katherine Rosario is a 52 y.o. female pt of Dr. Delton See She was last seen in Dec. 2016 by Dr. Eloy End with palpitations and presyncope   On Labor day, she passed out, fell, broke her hand  Stood up in the evening, walked to the kitchen,  made it only 20 steps  Thinks she is getting enough water / gatorade zero .   Has not passed out since that time   Electrolytes are good  TSH is normal   Walks her dogs  40 min - 1 hour No real cardio   Echo was normal in 2015.  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         Risk Assessment/Calculations:             Physical Exam:   VS:  BP 131/89   Pulse 70   Ht 5' 1.5" (1.562 m)   Wt 131 lb 3.2 oz (59.5 kg)   LMP 10/24/2014   SpO2 98%   BMI 24.39 kg/m    Wt Readings from Last 3 Encounters:  07/27/23 131 lb 3.2 oz (59.5 kg)  07/12/23 135 lb 9.6 oz (61.5 kg)  06/27/23 130 lb (59 kg)    GEN: Well nourished, well developed in no acute distress NECK: No JVD; No carotid bruits CARDIAC: RRR, no murmurs, rubs, gallops RESPIRATORY:  Clear to auscultation without rales, wheezing or rhonchi  ABDOMEN: Soft, non-tender, non-distended EXTREMITIES:  No edema; No deformity   ASSESSMENT AND PLAN: .      Syncope :   Will place a 14-day event monitor.  Will repeat her echocardiogram.  Advised her to hydrate better.   She has not had any recent episodes .  We will see her in 3 months   Hypdration optons  - V-8 every morning  - DIY electrolyte solution  1/8/- 1/4 tsp of Morton lite salt  Mio flavoring or lemon juice to flavor  1 tsp surgar if needed          Dispo:  3 months   Signed, Kristeen Miss, MD

## 2023-07-27 ENCOUNTER — Ambulatory Visit: Payer: BC Managed Care – PPO | Attending: Cardiovascular Disease | Admitting: Cardiovascular Disease

## 2023-07-27 ENCOUNTER — Encounter: Payer: Self-pay | Admitting: Cardiovascular Disease

## 2023-07-27 ENCOUNTER — Ambulatory Visit (INDEPENDENT_AMBULATORY_CARE_PROVIDER_SITE_OTHER): Payer: BC Managed Care – PPO

## 2023-07-27 VITALS — BP 131/89 | HR 70 | Ht 61.5 in | Wt 131.2 lb

## 2023-07-27 DIAGNOSIS — R55 Syncope and collapse: Secondary | ICD-10-CM

## 2023-07-27 NOTE — Progress Notes (Unsigned)
Applied a 14 day Zio XT monitor to patient in the office ?

## 2023-07-27 NOTE — Patient Instructions (Signed)
Testing/Procedures: 14-day Zio monitor Your physician has recommended that you wear an event monitor. Event monitors are medical devices that record the heart's electrical activity. Doctors most often [place] these monitors to diagnose arrhythmias. Arrhythmias are problems with the speed or rhythm of the heartbeat. The monitor is a small, portable device. You can wear one while you do your normal daily activities. This is usually used to diagnose what is causing palpitations/syncope (passing out).  ECHO Your physician has requested that you have an echocardiogram. Echocardiography is a painless test that uses sound waves to create images of your heart. It provides your doctor with information about the size and shape of your heart and how well your heart's chambers and valves are working. This procedure takes approximately one hour. There are no restrictions for this procedure. Please do NOT wear cologne, perfume, aftershave, or lotions (deodorant is allowed). Please arrive 15 minutes prior to your appointment time.  Please note: We ask at that you not bring children with you during ultrasound (echo/ vascular) testing. Due to room size and safety concerns, children are not allowed in the ultrasound rooms during exams. Our front office staff cannot provide observation of children in our lobby area while testing is being conducted. An adult accompanying a patient to their appointment will only be allowed in the ultrasound room at the discretion of the ultrasound technician under special circumstances. We apologize for any inconvenience.  Follow-Up: At [facility], you and your health needs are our priority.  As part of our continuing mission to provide you with exceptional heart care, we have created designated Provider Care Teams.  These Care Teams include your primary Cardiologist (physician) and Advanced Practice Providers (APPs -  Physician Assistants and Nurse Practitioners) who all work together  to provide you with the care you need, when you need it.  Your next appointment:   3 month(s)  Provider:   Kristeen Miss, MD

## 2023-08-01 IMAGING — MG MM DIGITAL SCREENING BILAT W/ TOMO AND CAD
8 series · 8 of 24 positions shown · non-contrast
Comparison: Previous exam(s).

CLINICAL DATA: Screening.

EXAM:
DIGITAL SCREENING BILATERAL MAMMOGRAM WITH TOMOSYNTHESIS AND CAD
TECHNIQUE: Bilateral screening digital craniocaudal and mediolateral oblique
mammograms were obtained. Bilateral screening digital breast
tomosynthesis was performed. The images were evaluated with
computer-aided detection.

[R CC synth-2D]
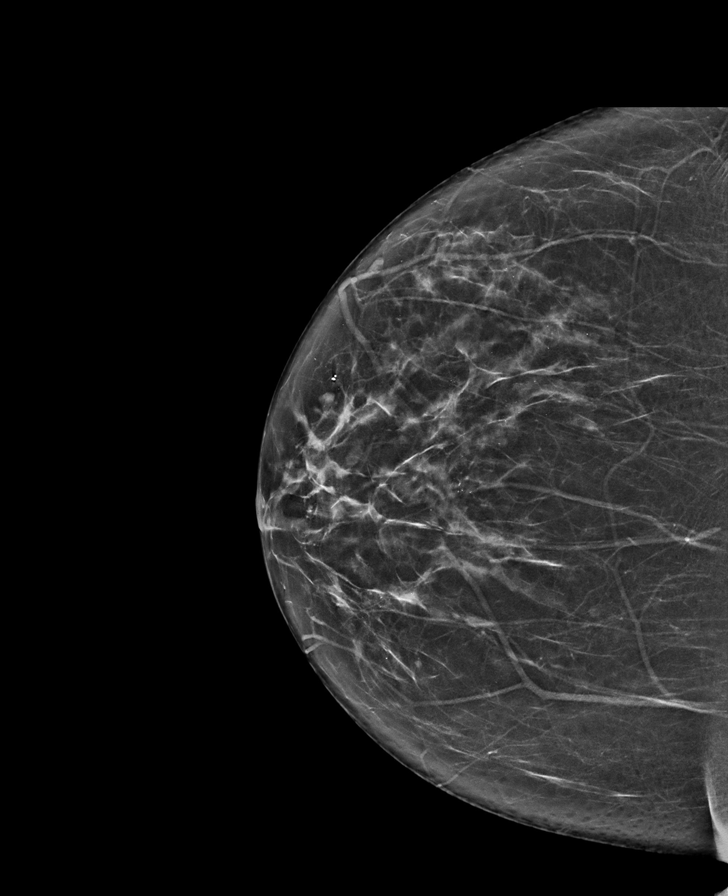

[R MLO synth-2D]
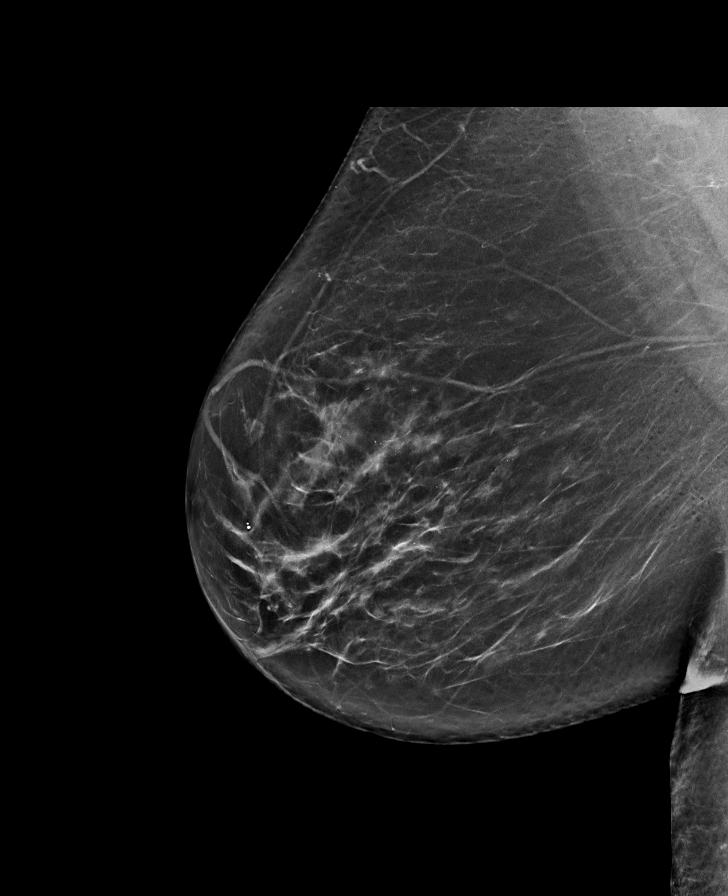

[L CC synth-2D]
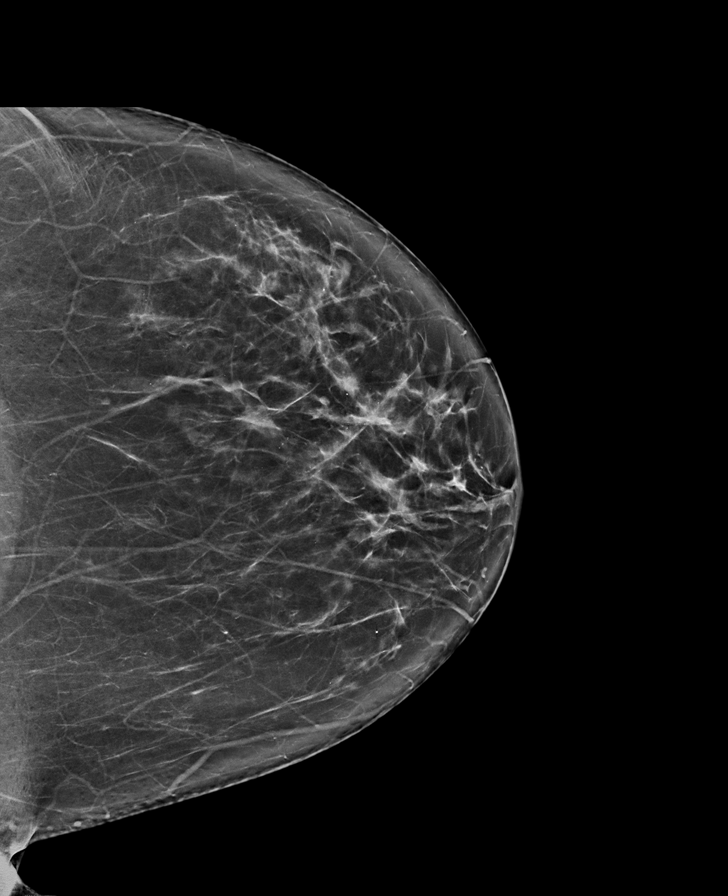

[L MLO synth-2D]
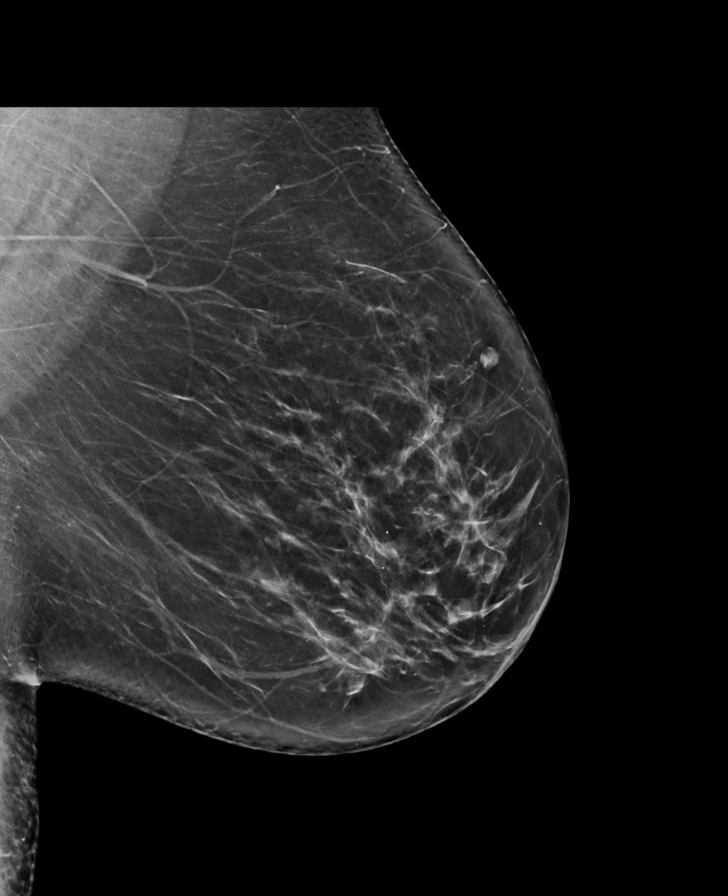

[R MLO tomo · tomo slice 43/86.0]
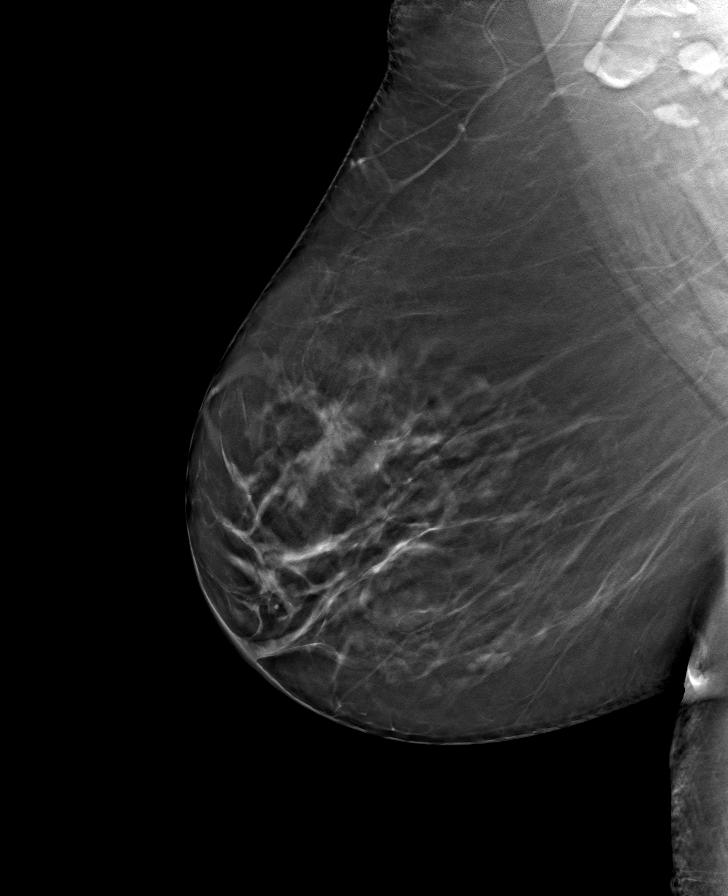

[R CC tomo · tomo slice 37/72.0]
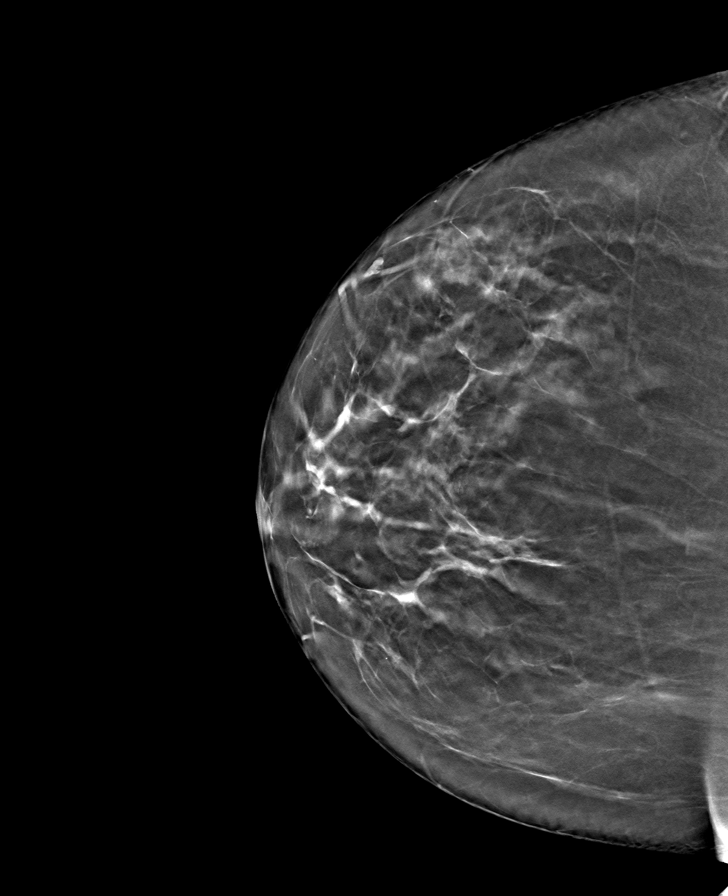

[L MLO tomo · tomo slice 44/87.0]
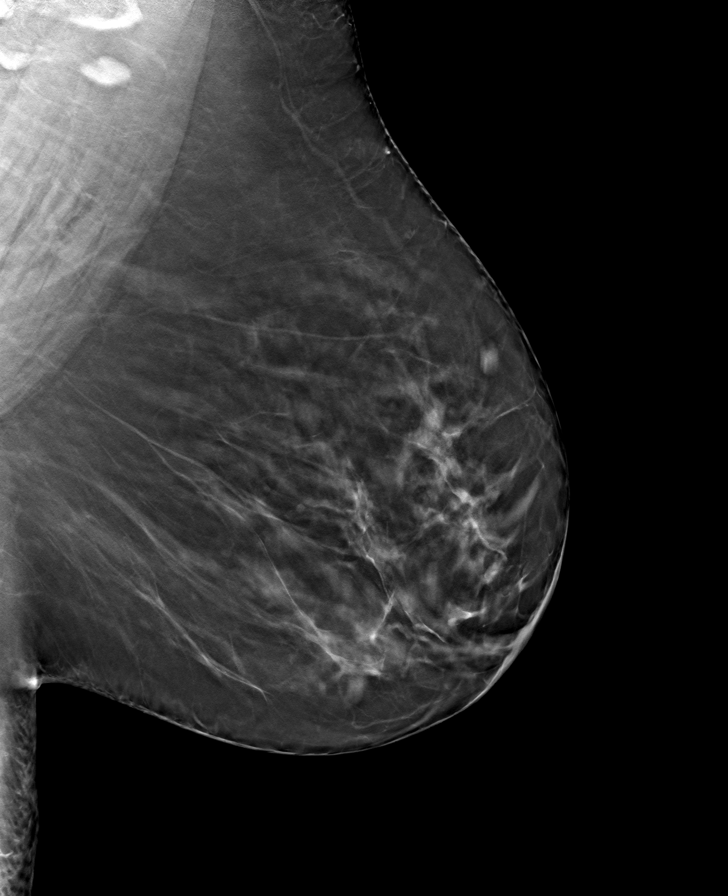

[L CC tomo · tomo slice 41/80.0]
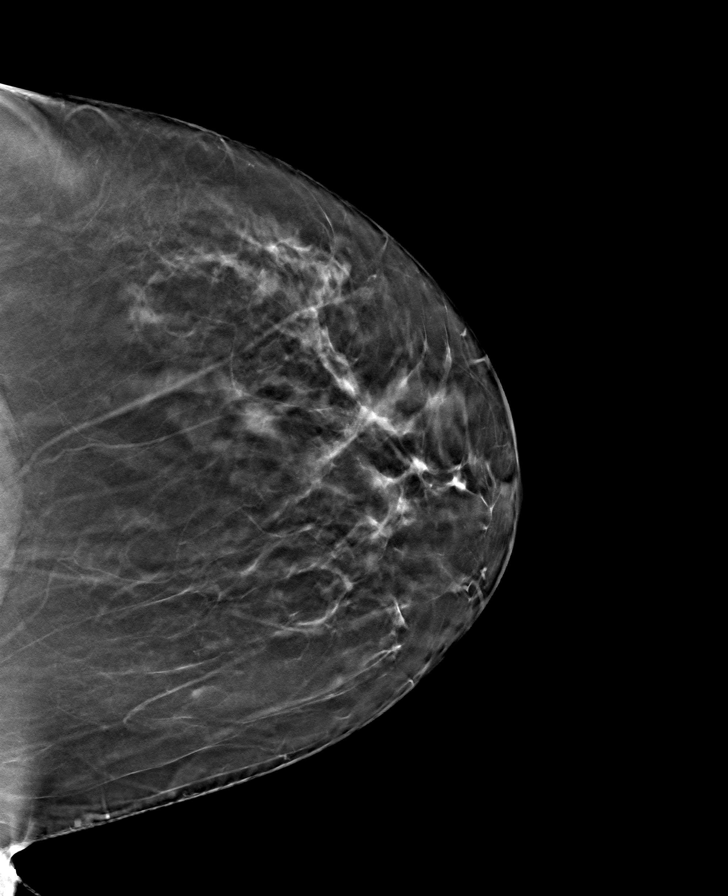

[8 of 24 positions shown; findings below may reference images not displayed]

ACR Breast Density Category c: The breast tissue is heterogeneously
dense, which may obscure small masses.
FINDINGS: There are no findings suspicious for malignancy.
IMPRESSION: No mammographic evidence of malignancy. A result letter of this
screening mammogram will be mailed directly to the patient.

RECOMMENDATION:
Screening mammogram in one year. (Code:Q3-W-BC3)

BI-RADS CATEGORY  1: Negative.

## 2023-08-06 DIAGNOSIS — M0609 Rheumatoid arthritis without rheumatoid factor, multiple sites: Secondary | ICD-10-CM | POA: Diagnosis not present

## 2023-08-10 ENCOUNTER — Encounter: Payer: Self-pay | Admitting: Cardiovascular Disease

## 2023-08-14 ENCOUNTER — Encounter: Payer: Self-pay | Admitting: Adult Health

## 2023-08-14 NOTE — Telephone Encounter (Signed)
Disregard

## 2023-08-15 ENCOUNTER — Telehealth (INDEPENDENT_AMBULATORY_CARE_PROVIDER_SITE_OTHER): Payer: BC Managed Care – PPO | Admitting: Adult Health

## 2023-08-15 ENCOUNTER — Encounter: Payer: Self-pay | Admitting: Adult Health

## 2023-08-15 VITALS — HR 95 | Temp 100.8°F | Ht 61.5 in | Wt 125.0 lb

## 2023-08-15 DIAGNOSIS — H1033 Unspecified acute conjunctivitis, bilateral: Secondary | ICD-10-CM

## 2023-08-15 DIAGNOSIS — U071 COVID-19: Secondary | ICD-10-CM

## 2023-08-15 MED ORDER — PREDNISONE 10 MG PO TABS: 10.0000 mg | ORAL_TABLET | Freq: Every day | ORAL | 0 refills | Status: DC

## 2023-08-15 MED ORDER — MOLNUPIRAVIR EUA 200MG CAPSULE: 4.0000 | ORAL_CAPSULE | Freq: Two times a day (BID) | ORAL | 0 refills | Status: AC

## 2023-08-15 MED ORDER — TOBRAMYCIN 0.3 % OP SOLN: 2.0000 [drp] | OPHTHALMIC | 0 refills | Status: AC

## 2023-08-15 NOTE — Progress Notes (Signed)
Virtual Visit via Video Note  I connected with Katherine Rosario on 08/15/23 at  1:15 PM EST by a video enabled telemedicine application and verified that I am speaking with the correct person using two identifiers.  Location patient: home Location provider:work or home office Persons participating in the virtual visit: patient, provider  I discussed the limitations of evaluation and management by telemedicine and the availability of in person appointments. The patient expressed understanding and agreed to proceed.   HPI: 52 year old female who is being evaluated today for an acute issue.  She reports that starting 3 to 4 days ago she developed a fever up to 100.8, chills, cough, headache, fatigue, body aches, hoarseness, and shortness of breath.  Her symptoms have continued.  She took a home COVID test yesterday which appeared to be positive.  At home yesterday she did use her inhaler and this helped with her shortness of breath.  She also reports that yesterday she got up with both of her eyes matted shut, mucus in her eyes and a burning sensation in both eyes.  Her eyes appeared to be red.   ROS: See pertinent positives and negatives per HPI.  Past Medical History:  Diagnosis Date   Allergic rhinitis    Anemia    Heavy menstrual flow/OTC FE BID:     Arrhythmia    Asthma    Family history of adverse reaction to anesthesia    Dad's heart stopped after surgery   Fibroids    GERD (gastroesophageal reflux disease)    Heart palpitations    History of kidney stones    Hyperlipidemia    Migraines    OSA (obstructive sleep apnea) 05/06/2014   Pneumonia    Rheumatoid arthritis (HCC)    Syncope and collapse     Past Surgical History:  Procedure Laterality Date   LAPAROSCOPIC GASTRIC SLEEVE RESECTION N/A 07/17/2022   Procedure: LAPAROSCOPIC SLEEVE GASTRECTOMY;  Surgeon: Berna Bue, MD;  Location: [facility];  Service: General;  Laterality: N/A;   UPPER GI ENDOSCOPY N/A 07/17/2022    Procedure: UPPER GI ENDOSCOPY;  Surgeon: Berna Bue, MD;  Location: [facility];  Service: General;  Laterality: N/A;   UTERINE FIBROID EMBOLIZATION     WISDOM TOOTH EXTRACTION      Family History  Problem Relation Age of Onset   Hyperlipidemia Mother    Hypertension Mother    Colon cancer Father 73   Hyperlipidemia Father    Hypertension Father    Prostate cancer Father 20   Heart attack Neg Hx    Stroke Neg Hx        Current Outpatient Medications:    albuterol (VENTOLIN HFA) 108 (90 Base) MCG/ACT inhaler, Inhale 2 puffs into the lungs every 6 (six) hours as needed for wheezing or shortness of breath., Disp: 6.7 g, Rfl: 0   Azelastine HCl 137 MCG/SPRAY SOLN, 1 puff in each nostril Nasally Twice a day for 30 days, Disp: , Rfl:    B Complex-C (B-COMPLEX WITH VITAMIN C) tablet, Take 1 tablet by mouth daily., Disp: , Rfl:    cetirizine (ZYRTEC) 10 MG chewable tablet, Chew by mouth., Disp: , Rfl:    Cholecalciferol (VITAMIN D) 125 MCG (5000 UT) CAPS, Take 5,000 Units by mouth daily., Disp: , Rfl:    fluticasone (FLONASE) 50 MCG/ACT nasal spray, Place 2 sprays into both nostrils daily., Disp: 16 g, Rfl: 6   folic acid (FOLVITE) 1 MG tablet, Take 1 mg by mouth daily., Disp: ,  Rfl:    golimumab (SIMPONI ARIA) 50 MG/4ML SOLN injection, Inject 50 mg into the vein every 8 (eight) weeks. Dose may change due to weight, Disp: , Rfl:    methotrexate 2.5 MG tablet, Take 20 mg by mouth once a week. Saturday (Taking 8 tabs weekly), Disp: , Rfl:    molnupiravir EUA (LAGEVRIO) 200 mg CAPS capsule, Take 4 capsules (800 mg total) by mouth 2 (two) times daily for 5 days., Disp: 40 capsule, Rfl: 0   montelukast (SINGULAIR) 10 MG tablet, 1 tablet Orally Once a day, Disp: , Rfl:    ondansetron (ZOFRAN-ODT) 4 MG disintegrating tablet, TAKE 1 TABLET (4 MG TOTAL) BY MOUTH EVERY 6 (SIX) HOURS AS NEEDED FOR NAUSEA OR VOMITING, Disp: , Rfl:    pantoprazole (PROTONIX) 40 MG tablet, Take 1 tablet (40 mg total) by  mouth daily. Take this medication daily, regardless of reflux symptoms, Disp: 90 tablet, Rfl: 0   predniSONE (DELTASONE) 10 MG tablet, Take 1 tablet (10 mg total) by mouth daily with breakfast., Disp: 5 tablet, Rfl: 0   SUMAtriptan (IMITREX) 100 MG tablet, TAKE 1 TAB AT ONSET OF MIGRAINE REPEAT IN 2 HRS IF NEEDED, Disp: 10 tablet, Rfl: 3   tobramycin (TOBREX) 0.3 % ophthalmic solution, Place 2 drops into both eyes every 4 (four) hours for 5 days., Disp: 5 mL, Rfl: 0  EXAM:  VITALS per patient if applicable:  GENERAL: alert, oriented, appears acutely ill.   HEENT: atraumatic, conjunttiva reddened,  no obvious abnormalities on inspection of external nose and ears. Hoarsevoice present   NECK: normal movements of the head and neck  LUNGS: on inspection no signs of respiratory distress, breathing rate appears normal, no obvious gross SOB, gasping or wheezing  CV: no obvious cyanosis  MS: moves all visible extremities without noticeable abnormality  PSYCH/NEURO: pleasant and cooperative, no obvious depression or anxiety, speech and thought processing grossly intact  ASSESSMENT AND PLAN:  Discussed the following assessment and plan:  1. COVID-19 -Treat for COVID-19 with molnupiravir as this was the preferred agent by her insurance policy.  Will also send in prednisone to help with shortness of breath and cough.  She was advised to stay hydrated and rest.  Can take Tylenol or Motrin to help alleviate symptoms.  Follow-up early next week if not improving - Work note provided  - molnupiravir EUA (LAGEVRIO) 200 mg CAPS capsule; Take 4 capsules (800 mg total) by mouth 2 (two) times daily for 5 days.  Dispense: 40 capsule; Refill: 0 - predniSONE (DELTASONE) 10 MG tablet; Take 1 tablet (10 mg total) by mouth daily with breakfast.  Dispense: 5 tablet; Refill: 0  2. Acute bacterial conjunctivitis of both eyes  - tobramycin (TOBREX) 0.3 % ophthalmic solution; Place 2 drops into both eyes every 4  (four) hours for 5 days.  Dispense: 5 mL; Refill: 0      I discussed the assessment and treatment plan with the patient. The patient was provided an opportunity to ask questions and all were answered. The patient agreed with the plan and demonstrated an understanding of the instructions.   The patient was advised to call back or seek an in-person evaluation if the symptoms worsen or if the condition fails to improve as anticipated.   Shirline Frees, NP

## 2023-08-16 DIAGNOSIS — R55 Syncope and collapse: Secondary | ICD-10-CM | POA: Diagnosis not present

## 2023-08-31 ENCOUNTER — Other Ambulatory Visit (HOSPITAL_COMMUNITY): Payer: BC Managed Care – PPO

## 2023-09-02 DIAGNOSIS — J3489 Other specified disorders of nose and nasal sinuses: Secondary | ICD-10-CM | POA: Diagnosis not present

## 2023-09-04 ENCOUNTER — Ambulatory Visit (HOSPITAL_COMMUNITY): Payer: BC Managed Care – PPO | Attending: Cardiology

## 2023-09-04 DIAGNOSIS — R55 Syncope and collapse: Secondary | ICD-10-CM

## 2023-09-04 LAB — ECHOCARDIOGRAM COMPLETE
Area-P 1/2: 3.53 cm2
S' Lateral: 2.5 cm

## 2023-09-09 ENCOUNTER — Other Ambulatory Visit: Payer: Self-pay | Admitting: Adult Health

## 2023-09-09 DIAGNOSIS — Z8669 Personal history of other diseases of the nervous system and sense organs: Secondary | ICD-10-CM

## 2023-10-01 DIAGNOSIS — M0609 Rheumatoid arthritis without rheumatoid factor, multiple sites: Secondary | ICD-10-CM | POA: Diagnosis not present

## 2023-10-28 NOTE — Telephone Encounter (Signed)
 Patient has an appt scheduled with BW.

## 2023-11-01 DIAGNOSIS — M1990 Unspecified osteoarthritis, unspecified site: Secondary | ICD-10-CM | POA: Diagnosis not present

## 2023-11-01 DIAGNOSIS — Z79899 Other long term (current) drug therapy: Secondary | ICD-10-CM | POA: Diagnosis not present

## 2023-11-01 DIAGNOSIS — M0609 Rheumatoid arthritis without rheumatoid factor, multiple sites: Secondary | ICD-10-CM | POA: Diagnosis not present

## 2023-11-01 DIAGNOSIS — R768 Other specified abnormal immunological findings in serum: Secondary | ICD-10-CM | POA: Diagnosis not present

## 2023-11-04 ENCOUNTER — Encounter: Payer: Self-pay | Admitting: Cardiovascular Disease

## 2023-11-04 NOTE — Progress Notes (Unsigned)
  Cardiology Office Note:  .   Date:  11/05/2023  ID:  Su Grand, DOB 08/30/1971, MRN [id number] PCP: Shirline Frees, NP  [facility] Providers Cardiologist:  Jonnie Finner to update primary MD,subspecialty MD or APP then REFRESH:1}   History of Present Illness: Katherine Rosario is a 53 y.o. female pt of Dr. Delton See She was last seen in Dec. 2016 by Dr. Eloy End with palpitations and presyncope   On Labor day, she passed out, fell, broke her hand  Stood up in the evening, walked to the kitchen,  made it only 20 steps  Thinks she is getting enough water / gatorade zero .   Has not passed out since that time   Electrolytes are good  TSH is normal   Walks her dogs  40 min - 1 hour No real cardio   Echo was normal in 2015.  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         Risk Assessment/Calculations:    Physical Exam:     Physical Exam: Blood pressure 108/64, pulse 73, height 5' 1.5" (1.562 m), weight 123 lb 9.6 oz (56.1 kg), last menstrual period 10/24/2014, SpO2 98%.       GEN:  Well nourished, well developed in no acute distress HEENT: Normal NECK: No JVD; No carotid bruits LYMPHATICS: No lymphadenopathy CARDIAC: RRR , no murmurs, rubs, gallops RESPIRATORY:  Clear to auscultation without rales, wheezing or rhonchi  ABDOMEN: Soft, non-tender, non-distended MUSCULOSKELETAL:  No edema; No deformity  SKIN: Warm and dry NEUROLOGIC:  Alert and oriented x 3   ASSESSMENT AND PLAN: .      Syncope :    symptoms were c/w orthostatic hypotension .  Her symptoms are much better after adding  electrolytes ( Mortons lite salt )  I encouraged her to continue with her hydration.  Will see her on an as-needed basis.      Hypdration optons  - V-8 every morning  - DIY electrolyte solution  1/8/- 1/4 tsp of Morton lite salt  Mio flavoring or lemon juice to flavor  1 tsp surgar if needed          Dispo:    Signed, Kristeen Miss, MD

## 2023-11-05 ENCOUNTER — Encounter: Payer: Self-pay | Admitting: Cardiovascular Disease

## 2023-11-05 ENCOUNTER — Ambulatory Visit: Payer: BC Managed Care – PPO | Attending: Cardiovascular Disease | Admitting: Cardiovascular Disease

## 2023-11-05 ENCOUNTER — Ambulatory Visit: Payer: BC Managed Care – PPO | Admitting: Cardiovascular Disease

## 2023-11-05 VITALS — BP 108/64 | HR 73 | Ht 61.5 in | Wt 123.6 lb

## 2023-11-05 DIAGNOSIS — I951 Orthostatic hypotension: Secondary | ICD-10-CM | POA: Diagnosis not present

## 2023-11-05 NOTE — Patient Instructions (Signed)
 Follow-Up: At [facility], you and your health needs are our priority.  As part of our continuing mission to provide you with exceptional heart care, we have created designated Provider Care Teams.  These Care Teams include your primary Cardiologist (physician) and Advanced Practice Providers (APPs -  Physician Assistants and Nurse Practitioners) who all work together to provide you with the care you need, when you need it.  Your next appointment:   As Needed  Provider:   Kristeen Miss, MD      1st Floor: - Lobby - Registration  - Pharmacy  - Lab - Cafe  2nd Floor: - PV Lab - Diagnostic Testing (echo, CT, nuclear med)  3rd Floor: - Vacant  4th Floor: - TCTS (cardiothoracic surgery) - AFib Clinic - Structural Heart Clinic - Vascular Surgery  - Vascular Ultrasound  5th Floor: - HeartCare Cardiology (general and EP) - Clinical Pharmacy for coumadin, hypertension, lipid, weight-loss medications, and med management appointments    Valet parking services will be available as well.

## 2023-11-12 DIAGNOSIS — R87612 Low grade squamous intraepithelial lesion on cytologic smear of cervix (LGSIL): Secondary | ICD-10-CM | POA: Diagnosis not present

## 2023-11-12 DIAGNOSIS — Z13820 Encounter for screening for osteoporosis: Secondary | ICD-10-CM | POA: Diagnosis not present

## 2023-11-28 DIAGNOSIS — D252 Subserosal leiomyoma of uterus: Secondary | ICD-10-CM | POA: Diagnosis not present

## 2023-11-28 DIAGNOSIS — D251 Intramural leiomyoma of uterus: Secondary | ICD-10-CM | POA: Diagnosis not present

## 2023-12-05 ENCOUNTER — Ambulatory Visit: Payer: BC Managed Care – PPO | Admitting: Primary Care

## 2023-12-05 ENCOUNTER — Encounter: Payer: Self-pay | Admitting: Primary Care

## 2023-12-05 VITALS — BP 119/78 | HR 80 | Temp 97.5°F | Ht 61.0 in | Wt 122.4 lb

## 2023-12-05 DIAGNOSIS — G4733 Obstructive sleep apnea (adult) (pediatric): Secondary | ICD-10-CM | POA: Diagnosis not present

## 2023-12-05 DIAGNOSIS — Z7189 Other specified counseling: Secondary | ICD-10-CM

## 2023-12-05 MED ORDER — HYDROXYZINE HCL 25 MG PO TABS: 25.0000 mg | ORAL_TABLET | Freq: Every evening | ORAL | 1 refills | Status: DC | PRN

## 2023-12-05 NOTE — Progress Notes (Signed)
 @Patient  ID: Katherine Rosario, female    DOB: 08-08-1971, 53 y.o.   MRN: [id number]  Chief Complaint  Patient presents with   Follow-up    OSA F/U. Not using anything to treat it    Referring provider: Shirline Frees, NP  HPI:  53 year old female, never smoker followed for asthma and OSA. She is a patient of Dr. Shirlee More and last seen in office 06/16/2022 by [facility] NP. Past medical history significant for migraine, allergic rhinitis, GERD, HLD.  TEST/EVENTS:  07/06/2014 HST: AHI 11.9, SpO2 low 65%, average 94% 03/28/2022 HRCT chest: no evidence of fibrotic interstitial lung disease. Mild, lobular air trapping. There are occasional tiny nodules in the dependent LLL measuring 0.2 cm.  05/28/2022 HST: AHI 46.5/hr; SpO2 low 67%, average 93%  Previous [facility] pulmonary encounter: 03/20/2022: OV with Dr. Isaiah Serge. Changed from Arnuity to Symbicort in June 2023. Doing much better on this with improvement of cough and dyspnea. Use flonase, chlortab and pepcid. HRCT to rule out RA ILD. Hx of OSA but quit using CPAP 3 years ago - HST ordered for further evaluation.   06/16/2022: OV with [facility] NP to discuss her home sleep study results, which revealed severe OSA with AHI 46.5/h. She was on CPAP years ago, around 6-7 years ago, but her machine broke and she got it fixed once but then it broke again so she stopped using it. She struggles with excessive fatigue. She wakes up feeling poorly rested and wants to go back to bed. She snores very loudly. She has woken up gasping for air before. She does struggle with drowsy driving, especially when she's going into work. She has fallen asleep driving before but it's been years ago. She denies any sleep parasomnias/paralysis, history of narcolepsy or cataplexy.  She works third shirt from 10pm-8 am. She goes to bed around 11 am-noon. Wakes a few times to use the restroom. Officially gets out of the bed around 7-8pm. Doesn't have trouble falling asleep. She doesn't take any  sleep aids. Her breathing is stable. Feels like the symbicort works well for her. She rarely has to use her albuterol. Ran a 5K last week and used it. No increased cough, chest congestion or wheezing. She is getting ready for gastric sleeve surgery; tentatively set for November 2023.   09/08/2021 Patient presents today for follow-up and CPAP compliance.  She restarted CPAP therapy since she was here last.  Has been tolerating it well.  She did have some days where she missed using it last month due to the passing of her father in an unexpected car accident.  She had been traveling a lot and did not always take it back and forth with her.  Her sleep has also been a little affected by this to and she has not been sleeping as long as she normally does.  Trying to get back in a normal pattern.  Otherwise, she has not had any issues.  No significant leaks.  Feels like it is helping with her daytime fatigue symptoms.  Feels like she has not has much trouble with drowsy driving. Overall, her breathing has been stable.  She is currently on Symbicort 160 twice daily.  Rare use of albuterol.  No increased cough, congestion, wheezing.  Underwent gastric sleeve surgery in November.  No postoperative complications.  08/09/2022-09/07/2021 CPAP auto 5-20 cmH2O 25/30 days used; 77% >4hr; average usage 6 hours 12 minutes Pressure median 5.7, 95th 7.8 Leaks median 0, 95th 5.2 AHI 1.5  06/05/2023 Patient presents today for follow-up for OSA/mild intermittent asthma. She is 77% compliant with CPAP use greater than 4 hours over the last 30 days. Current pressure 5 to 20 cm H2O. She has lost 90 lbs in the last year and would like to see if she can come off CPAP. On nights she does not wear CPAP she does not wake up gasping/choking. Denies excessive daytime sleepiness.   Airview download 05/05/2023 - 06/03/2023 Usage days 23 days (77%) greater than 4 hours Average usage days used 6 hours 24 minutes Pressure 5 to 20 cm H2O  (7.9 cm H2O-95%) Air leaks 15.5 L/min (95%) AHI 1.0  12/05/2023- interim hx  Discussed the use of AI scribe software for clinical note transcription with the patient, who gave verbal consent to proceed.  History of Present Illness   Katherine Rosario is a 53 year old female who presents for a follow-up after significant weight loss and a repeat sleep study.  She initially presented with severe sleep apnea, characterized by an average of 46 apneic events per hour during her original sleep study when her weight was 225 pounds. Following a gastric sleeve procedure, she has lost significant weight, now weighing between 118 and 125 pounds. Her most recent sleep study in November 2024 showed mild sleep apnea with an average of 8.7 apneic events per hour.  She has an appointment with a nutritionist to address her difficulty maintaining weight. Her weight loss was intentional, initially for the gastric sleeve procedure.  Her sleep quality is currently affected by stress due to a recent breakup and family issues. She experiences intermittent sleep, often waking after three to four hours, but does not experience waking due to snoring or apnea as she did previously. Snoring is now light and not disruptive. She sleeps on her side to help manage her sleep apnea.  She has a history of using Atarax (hydroxyzine) for anxiety and sleep issues, which was helpful during a previous period of stress. She is considering resuming this medication to help with her current sleep difficulties. She is also seeking behavioral therapy to cope with recent stressors.    No Known Allergies  Immunization History  Administered Date(s) Administered   DTaP 05/01/2009   Influenza Inj Mdck Quad Pf 06/15/2022   Influenza Split 06/04/2013   Influenza,inj,Quad PF,6+ Mos 06/04/2014, 07/01/2020   Influenza-Unspecified 07/07/1999, 06/15/2021, 07/08/2021   Moderna Sars-Covid-2 Vaccination 06/15/2022   PFIZER(Purple Top)SARS-COV-2  Vaccination 11/19/2019, 12/12/2019   Td 09/04/2004, 02/17/2020   Tdap 05/01/2009   Unspecified SARS-COV-2 Vaccination 11/19/2019, 12/12/2019, 06/10/2020, 07/08/2021   Zoster Recombinant(Shingrix) 06/15/2022, 11/13/2022    Past Medical History:  Diagnosis Date   Allergic rhinitis    Anemia    Heavy menstrual flow/OTC FE BID:     Arrhythmia    Asthma    Family history of adverse reaction to anesthesia    Dad's heart stopped after surgery   Fibroids    GERD (gastroesophageal reflux disease)    Heart palpitations    History of kidney stones    Hyperlipidemia    Migraines    OSA (obstructive sleep apnea) 05/06/2014   Pneumonia    Rheumatoid arthritis (HCC)    Syncope and collapse     Tobacco History: Social History   Tobacco Use  Smoking Status Never   Passive exposure: Never  Smokeless Tobacco Former   Types: Snuff   Quit date: 2021   Counseling given: Not Answered   Outpatient Medications Prior to Visit  Medication  Sig Dispense Refill   albuterol (VENTOLIN HFA) 108 (90 Base) MCG/ACT inhaler Inhale 2 puffs into the lungs every 6 (six) hours as needed for wheezing or shortness of breath. 6.7 g 0   Azelastine HCl 137 MCG/SPRAY SOLN 1 puff in each nostril Nasally Twice a day for 30 days     B Complex-C (B-COMPLEX WITH VITAMIN C) tablet Take 1 tablet by mouth daily.     cetirizine (ZYRTEC) 10 MG chewable tablet Chew by mouth.     Cholecalciferol (VITAMIN D) 125 MCG (5000 UT) CAPS Take 5,000 Units by mouth daily.     fluticasone (FLONASE) 50 MCG/ACT nasal spray Place 2 sprays into both nostrils daily. 16 g 6   folic acid (FOLVITE) 1 MG tablet Take 1 mg by mouth daily.     golimumab (SIMPONI ARIA) 50 MG/4ML SOLN injection Inject 50 mg into the vein every 8 (eight) weeks. Dose may change due to weight     methotrexate 2.5 MG tablet Take 20 mg by mouth once a week. Saturday (Taking 8 tabs weekly)     montelukast (SINGULAIR) 10 MG tablet 1 tablet Orally Once a day      ondansetron (ZOFRAN-ODT) 4 MG disintegrating tablet TAKE 1 TABLET (4 MG TOTAL) BY MOUTH EVERY 6 (SIX) HOURS AS NEEDED FOR NAUSEA OR VOMITING     pantoprazole (PROTONIX) 40 MG tablet Take 1 tablet (40 mg total) by mouth daily. Take this medication daily, regardless of reflux symptoms 90 tablet 0   SUMAtriptan (IMITREX) 100 MG tablet TAKE 1 TAB AT ONSET OF MIGRAINE REPEAT IN 2 HRS IF NEEDED 9 tablet 4   No facility-administered medications prior to visit.      Review of Systems  Review of Systems   Physical Exam  BP 119/78 (BP Location: Right Arm, Patient Position: Sitting, Cuff Size: Normal)   Pulse 80   Temp (!) 97.5 F (36.4 C) (Temporal)   Ht 5\' 1"  (1.549 m)   Wt 122 lb 6.4 oz (55.5 kg)   LMP 10/24/2014   SpO2 97%   BMI 23.13 kg/m  Physical Exam   Lab Results:  CBC    Component Value Date/Time   WBC 6.4 06/27/2023 0750   RBC 4.30 06/27/2023 0750   HGB 13.5 06/27/2023 0750   HCT 41.6 06/27/2023 0750   PLT 315 06/27/2023 0750   MCV 96.7 06/27/2023 0750   MCH 31.4 06/27/2023 0750   MCHC 32.5 06/27/2023 0750   RDW 13.5 06/27/2023 0750   LYMPHSABS 4.6 (H) 04/20/2023 1359   MONOABS 0.7 04/20/2023 1359   EOSABS 0.3 04/20/2023 1359   BASOSABS 0.1 04/20/2023 1359    BMET    Component Value Date/Time   NA 139 06/27/2023 0750   K 3.7 06/27/2023 0750   CL 104 06/27/2023 0750   CO2 25 06/27/2023 0750   GLUCOSE 97 06/27/2023 0750   BUN 15 06/27/2023 0750   CREATININE 0.70 06/27/2023 0750   CALCIUM 9.0 06/27/2023 0750   GFRNONAA >60 06/27/2023 0750   GFRAA >60 11/14/2017 0127    BNP    Component Value Date/Time   BNP 9.4 06/27/2023 0900    ProBNP No results found for: "PROBNP"  Imaging: No results found.   Assessment & Plan:   1. Grief counseling (Primary) - Ambulatory referral to Behavioral Health  2. OSA (obstructive sleep apnea)   Assessment and Plan    Mild Obstructive Sleep Apnea Previously severe sleep apnea improved with weight loss.  Her most recent sleep  study in November 2024 showed mild sleep apnea with an average of 8.7 apneic events per hour. Light snoring not disruptive. Minimal cardiovascular risk. Patient no longer requiring CPAP.  - Discharged from follow-up for sleep apnea. - Advised to avoid supine position, use lateral sleeping or wedge pillow. - Advised to avoid heavy alcohol consumption before bedtime. - Instructed to return if symptoms worsen.  Insomnia Intermittent insomnia likely due to stress. Previously effective hydroxyzine considered for management. Exploring behavioral therapy options. - Prescribed hydroxyzine 25 mg at bedtime. - Referred to behavioral health for therapy. - Suggested [facility] for online therapy as short-term option. - Advised follow-up with primary care for ongoing prescription management.        Glenford Bayley, NP 12/05/2023

## 2023-12-05 NOTE — Patient Instructions (Signed)
-  MILD OBSTRUCTIVE SLEEP APNEA: Mild obstructive sleep apnea means you have occasional interruptions in your breathing during sleep, but it is not severe. Your condition has improved significantly with weight loss. You are discharged from follow-up for sleep apnea. To manage your condition, avoid sleeping on your back, use a side-sleeping position or a wedge pillow, and avoid heavy alcohol consumption before bedtime. Please return if your symptoms worsen.  -INSOMNIA: Insomnia means you have difficulty falling or staying asleep. Your intermittent sleep is likely due to stress. We have prescribed hydroxyzine 25 mg to take at bedtime to help manage your sleep difficulties. You are also referred to behavioral health for therapy, and we suggested [facility] for online therapy as a short-term option. Please follow up with your primary care provider for ongoing prescription management.  INSTRUCTIONS: Please follow up with your primary care provider for ongoing prescription management of hydroxyzine. Return to the clinic if your sleep apnea symptoms worsen.

## 2024-01-10 ENCOUNTER — Encounter: Payer: BC Managed Care – PPO | Attending: Surgery | Admitting: Dietician

## 2024-01-10 ENCOUNTER — Encounter: Payer: Self-pay | Admitting: Dietician

## 2024-01-10 VITALS — Ht 61.0 in | Wt 119.0 lb

## 2024-01-10 DIAGNOSIS — E669 Obesity, unspecified: Secondary | ICD-10-CM | POA: Diagnosis not present

## 2024-01-10 NOTE — Progress Notes (Signed)
 Bariatric Nutrition Follow-Up Visit Medical Nutrition Therapy   Surgery date: 07/17/22 Surgery type: Sleeve  NUTRITION ASSESSMENT  Anthropometrics  Start weight at [facility]: 221.3 lbs (date: 05/16/2022)  Height: 61 in Weight today: 119.0 lbs.   Clinical  Medical hx: Asthma, sleep apnea, obesity, GERD, anemia Medications: see list   Labs: glucose 103; lipid panel WNL Notable signs/symptoms: none noted Any previous deficiencies? No Bowel Habits: Every day to every other day no complaints   Body Composition Scale 08/01/22 09/12/2022 12/12/2022 07/12/2023 01/10/2024  Current Body Weight 201.3 186.5 161.8 135.6 119.0  Total Body Fat % 43.0 41.1 36.7 30.4 25.2  Visceral Fat 15 13 10 7 5   Fat-Free Mass % 56.9 58.8 63.2 69.5 74.7   Total Body Water  % 42.9 43.9 46.1 49.2 51.8  Muscle-Mass lbs 27.8 27.4 27.3 27.0 26.7  BMI 38.1 35.2 30.5 25.5 22.3  Body Fat Displacement              Torso  lbs 53.6 47.5 36.7 25.4 18.4         Left Leg  lbs 10.7 9.5 7.3 5.0 3.6         Right Leg  lbs 10.7 9.5 7.3 5.0 3.6         Left Arm  lbs 5.3 4.7 3.6 2.5 1.8         Right Arm   lbs 5.3 4.7 3.6 2.5 1.8     Lifestyle & Dietary Hx  Pt's initial weight loss goal was to get to about 140 lbs. Pt states she has been doing some small weights and bike. Pt states she was eating nuts more, but states they don't sit well on her stomach. Pt states she is afraid to incorporate more carbohydrates, stating she needs to get in more calories. Pt states she is going to add more fruit and whole grain toast and whole grain pasta. Pt states she can add in more potato to get more carbohydrates.  Estimated daily fluid intake: 80-96 oz Estimated daily protein intake: 60-90 g Supplements: multivitamin and calcium  Current average weekly physical activity: ADLs, walking the dogs about 40 minutes a day. Light weights 2 days per week, 20-30 minutes, and cycling 2-3 days per week for 30 minutes.  24-Hr Dietary Recall First  Meal: spinach and feta egg bites with tomatoes and bacon bits or protein shake (most of the time right now) Snack:  [place] sausage sticks and cheese Snack: greek yogurt and granola or chicken sausage links Second Meal: left overs (chicken breast and spinach), tai peanut noodles with chicken Snack:  bought some cashews or fairlife chocolate protein shake Third Meal: Chicken breast or cauliflower crust spinach pizza or meatloaf, mashed potatoes and broccoli  Snack:  Beverages: water , Gatorade zero, water  with flavorings (half strength), coffee in the morning  Post-Op Goals/ Signs/ Symptoms Using straws: no Drinking while eating: no Chewing/swallowing difficulties: no Changes in vision: no Changes to mood/headaches: no Hair loss/changes to skin/nails: no Difficulty focusing/concentrating: no Sweating: no Limb weakness: no Dizziness/lightheadedness: no Palpitations: no  Carbonated/caffeinated beverages: no N/V/D/C/Gas: no Abdominal pain: no Dumping syndrome: no   NUTRITION DIAGNOSIS  Overweight/obesity (Frost-3.3) related to past poor dietary habits and physical inactivity as evidenced by completed bariatric surgery and following dietary guidelines for continued weight loss and healthy nutrition status.   NUTRITION INTERVENTION Nutrition counseling (C-1) and education (E-2) to facilitate bariatric surgery goals, including: The importance of consuming adequate calories as well as certain nutrients daily due to the body's  need for essential vitamins, minerals, and fats The importance of daily physical activity and to reach a goal of at least 150 minutes of moderate to vigorous physical activity weekly (or as directed by their physician) due to benefits such as increased musculature and improved lab values The importance of intuitive eating specifically learning hunger-satiety cues and understanding the importance of learning a new body: The importance of mindful eating to avoid grazing  behaviors  Why you need complex carbohydrates: Whole grains and other complex carbohydrates are required to have a healthy diet. Whole grains provide fiber which can help with blood glucose levels and help keep you satiated. Fruits and starchy vegetables provide essential vitamins and minerals required for immune function, eyesight support, brain support, bone density, wound healing and many other functions within the body. According to the current evidenced based 2020-2025 Dietary Guidelines for Americans, complex carbohydrates are part of a healthy eating pattern which is associated with a decreased risk for type 2 diabetes, cancers, and cardiovascular disease.  Carbohydrates are the body's preferred and most readily available source of energy Ease of Breakdown: Carbohydrates are relatively easy for the body to break down into glucose, a simple sugar that is the primary fuel for cells. This process requires less oxygen compared to breaking down fats. Brain Fuel: Glucose derived from carbohydrates is the primary fuel source for the brain. While the brain can adapt to use ketones (from fat breakdown) during prolonged carbohydrate restriction, glucose is its preferred and most efficient fuel.    Speed of Energy Release: Carbohydrates are quickly converted into glucose, providing a rapid source of energy for immediate needs, especially during physical activity.    Glycogen Storage: The body can store excess glucose as glycogen in the liver and muscles. This stored glycogen can be quickly converted back to glucose when energy demands increase.     Goals -Continue physical activity. -Aim for >50 grams of carbohydrates from whole grains, whole fruit and whole starchy vegetables.  Handouts Provided Include  Body Comp Scale   Learning Style & Readiness for Change Teaching method utilized: Visual & Auditory  Demonstrated degree of understanding via: Teach Back  Readiness Level: Action Barriers to  learning/adherence to lifestyle change: nothing identified   RD's Notes for Next Visit Assess adherence to pt chosen goals  MONITORING & EVALUATION Dietary intake, weekly physical activity, body weight.  Next Steps Patient is to follow-up in 6 months. Pt agreeable to call for a sooner appointment as needed.

## 2024-01-23 ENCOUNTER — Ambulatory Visit (HOSPITAL_COMMUNITY): Payer: Self-pay | Admitting: Licensed Clinical Social Worker

## 2024-01-31 ENCOUNTER — Other Ambulatory Visit: Payer: Self-pay | Admitting: Primary Care

## 2024-02-13 ENCOUNTER — Ambulatory Visit (HOSPITAL_COMMUNITY): Payer: Self-pay | Admitting: Licensed Clinical Social Worker

## 2024-02-13 DIAGNOSIS — M0609 Rheumatoid arthritis without rheumatoid factor, multiple sites: Secondary | ICD-10-CM | POA: Diagnosis not present

## 2024-02-20 ENCOUNTER — Encounter (HOSPITAL_COMMUNITY): Payer: Self-pay | Admitting: *Deleted

## 2024-02-29 DIAGNOSIS — F4323 Adjustment disorder with mixed anxiety and depressed mood: Secondary | ICD-10-CM | POA: Diagnosis not present

## 2024-03-05 DIAGNOSIS — M1990 Unspecified osteoarthritis, unspecified site: Secondary | ICD-10-CM | POA: Diagnosis not present

## 2024-03-05 DIAGNOSIS — M0609 Rheumatoid arthritis without rheumatoid factor, multiple sites: Secondary | ICD-10-CM | POA: Diagnosis not present

## 2024-03-05 DIAGNOSIS — Z79899 Other long term (current) drug therapy: Secondary | ICD-10-CM | POA: Diagnosis not present

## 2024-03-05 DIAGNOSIS — R768 Other specified abnormal immunological findings in serum: Secondary | ICD-10-CM | POA: Diagnosis not present

## 2024-03-13 DIAGNOSIS — F4323 Adjustment disorder with mixed anxiety and depressed mood: Secondary | ICD-10-CM | POA: Diagnosis not present

## 2024-03-21 DIAGNOSIS — Z9884 Bariatric surgery status: Secondary | ICD-10-CM | POA: Diagnosis not present

## 2024-03-27 DIAGNOSIS — F4323 Adjustment disorder with mixed anxiety and depressed mood: Secondary | ICD-10-CM | POA: Diagnosis not present

## 2024-04-03 DIAGNOSIS — F4323 Adjustment disorder with mixed anxiety and depressed mood: Secondary | ICD-10-CM | POA: Diagnosis not present

## 2024-04-09 DIAGNOSIS — M0609 Rheumatoid arthritis without rheumatoid factor, multiple sites: Secondary | ICD-10-CM | POA: Diagnosis not present

## 2024-04-11 DIAGNOSIS — F4323 Adjustment disorder with mixed anxiety and depressed mood: Secondary | ICD-10-CM | POA: Diagnosis not present

## 2024-04-17 DIAGNOSIS — F4323 Adjustment disorder with mixed anxiety and depressed mood: Secondary | ICD-10-CM | POA: Diagnosis not present

## 2024-04-23 ENCOUNTER — Encounter: Payer: Self-pay | Admitting: Adult Health

## 2024-04-23 ENCOUNTER — Ambulatory Visit (INDEPENDENT_AMBULATORY_CARE_PROVIDER_SITE_OTHER): Admitting: Adult Health

## 2024-04-23 VITALS — BP 110/70 | HR 68 | Temp 98.5°F | Ht 61.75 in | Wt 125.0 lb

## 2024-04-23 DIAGNOSIS — M069 Rheumatoid arthritis, unspecified: Secondary | ICD-10-CM | POA: Diagnosis not present

## 2024-04-23 DIAGNOSIS — Z0000 Encounter for general adult medical examination without abnormal findings: Secondary | ICD-10-CM | POA: Diagnosis not present

## 2024-04-23 DIAGNOSIS — Z8669 Personal history of other diseases of the nervous system and sense organs: Secondary | ICD-10-CM

## 2024-04-23 DIAGNOSIS — Z903 Acquired absence of stomach [part of]: Secondary | ICD-10-CM | POA: Diagnosis not present

## 2024-04-23 DIAGNOSIS — K219 Gastro-esophageal reflux disease without esophagitis: Secondary | ICD-10-CM

## 2024-04-23 DIAGNOSIS — J4520 Mild intermittent asthma, uncomplicated: Secondary | ICD-10-CM

## 2024-04-23 DIAGNOSIS — E782 Mixed hyperlipidemia: Secondary | ICD-10-CM

## 2024-04-23 DIAGNOSIS — F5101 Primary insomnia: Secondary | ICD-10-CM

## 2024-04-23 NOTE — Patient Instructions (Signed)
 It was great seeing you today   We will follow up with you regarding your lab work   Please let me know if you need anything

## 2024-04-23 NOTE — Progress Notes (Signed)
 Subjective:    Patient ID: Katherine Rosario, female    DOB: Aug 03, 1971, 53 y.o.   MRN: [id number]  HPI  Patient presents for yearly preventative medicine examination. She is a pleasant 53 year old female who  has a past medical history of Allergic rhinitis, Anemia, Arrhythmia, Asthma, Family history of adverse reaction to anesthesia, Fibroids, GERD (gastroesophageal reflux disease), Heart palpitations, History of kidney stones, Hyperlipidemia, Migraines, OSA (obstructive sleep apnea) (05/06/2014), Pneumonia, Rheumatoid arthritis (HCC), and Syncope and collapse.  S/p laparoscopic sleeve gastrectomy done in 08/2022.[name] She was last seen by Bariatric surgery a month ago. She was also seen by dietician and was advised to add back a little ore carbohydrates  Wt Readings from Last 3 Encounters:  04/23/24 125 lb (56.7 kg)  01/10/24 119 lb (54 kg)  12/05/23 122 lb 6.4 oz (55.5 kg)   Migraine Headaches -has Imitrex  as needed.  Her migraines are infrequent  Asthma -Currently managed with albuterol  inhaler. Has not had any recent exacerbations   Rheumatoid Arthritis -diagnosed in 2022.  She is followed by rheumatology.  She is currently managed with Simponi  injections. With weight loss she has been able to go down in her dose strength.   Hyperlipidemia - no longer taking Crestor  Lab Results  Component Value Date   CHOL 157 04/20/2023   HDL 57.30 04/20/2023   LDLCALC 87 04/20/2023   LDLDIRECT 150.8 08/25/2013   TRIG 63.0 04/20/2023   CHOLHDL 3 04/20/2023    OSA -No longer wearing CPAP due to weight loss. Her most recent sleep study in November 2024 showed mild sleep apnea  Insomnia - Intermittent and seems to be related to stress. Pulmonary has prescribed her Atarax  25 mg at bedtime PRN and she has started to see a therapist which she feels is helpful   GERD - uses Protonix  40 mg daily.   All immunizations and health maintenance protocols were reviewed with the patient and needed orders were  placed.  Appropriate screening laboratory values were ordered for the patient including screening of hyperlipidemia, renal function and hepatic function.  Medication reconciliation,  past medical history, social history, problem list and allergies were reviewed in detail with the patient  Goals were established with regard to weight loss, exercise, and  diet in compliance with medications  She is up to date on routine colon cancer screening. She is going to schedule her colonoscopy    Review of Systems  Constitutional: Negative.   HENT: Negative.    Eyes: Negative.   Respiratory: Negative.    Cardiovascular: Negative.   Gastrointestinal: Negative.   Endocrine: Negative.   Genitourinary: Negative.   Musculoskeletal: Negative.   Skin: Negative.   Allergic/Immunologic: Negative.   Neurological: Negative.   Hematological: Negative.   Psychiatric/Behavioral: Negative.     Past Medical History:  Diagnosis Date   Allergic rhinitis    Anemia    Heavy menstrual flow/OTC FE BID:     Arrhythmia    Asthma    Family history of adverse reaction to anesthesia    Dad's heart stopped after surgery   Fibroids    GERD (gastroesophageal reflux disease)    Heart palpitations    History of kidney stones    Hyperlipidemia    Migraines    OSA (obstructive sleep apnea) 05/06/2014   Pneumonia    Rheumatoid arthritis (HCC)    Syncope and collapse     Social History   Socioeconomic History   Marital status: Divorced    Spouse  name: Not on file   Number of children: Not on file   Years of education: Not on file   Highest education level: Some college, no degree  Occupational History   Occupation: Clinical research associate: [employer]  Tobacco Use   Smoking status: Never    Passive exposure: Never   Smokeless tobacco: Former    Types: Snuff    Quit date: 2021  Vaping Use   Vaping status: Never Used  Substance and Sexual Activity   Alcohol use: Yes     Alcohol/week: 2.0 standard drinks of alcohol    Types: 2 Glasses of wine per week    Comment: occ   Drug use: No   Sexual activity: Not Currently  Other Topics Concern   Not on file  Social History Narrative   married to female partner Vernell 12/2012, employed for Pharmacist, hospital.      Pt lives with wife, in a one story home. Has some college education.       She likes to hike and swim.    Social Drivers of Health   Financial Resource Strain: Medium Risk (12/26/2021)   Overall Financial Resource Strain (CARDIA)    Difficulty of Paying Living Expenses: Somewhat hard  Food Insecurity: No Food Insecurity (04/20/2023)   Hunger Vital Sign    Worried About Running Out of Food in the Last Year: Never true    Ran Out of Food in the Last Year: Never true  Transportation Needs: No Transportation Needs (07/17/2022)   PRAPARE - Administrator, Civil Service (Medical): No    Lack of Transportation (Non-Medical): No  Physical Activity: Sufficiently Active (04/20/2023)   Exercise Vital Sign    Days of Exercise per Week: 7 days    Minutes of Exercise per Session: 60 min  Stress: No Stress Concern Present (04/20/2023)   [employer] of Occupational Health - Occupational Stress Questionnaire    Feeling of Stress : Only a little  Social Connections: Socially Isolated (04/20/2023)   Social Connection and Isolation Panel    Frequency of Communication with Friends and Family: Never    Frequency of Social Gatherings with Friends and Family: Once a week    Attends Religious Services: Never    Database administrator or Organizations: No    Attends Banker Meetings: Never    Marital Status: Divorced  Catering manager Violence: Not At Risk (04/20/2023)   Humiliation, Afraid, Rape, and Kick questionnaire    Fear of Current or Ex-Partner: No    Emotionally Abused: No    Physically Abused: No    Sexually Abused: No    Past Surgical History:  Procedure Laterality Date    LAPAROSCOPIC GASTRIC SLEEVE RESECTION N/A 07/17/2022   Procedure: LAPAROSCOPIC SLEEVE GASTRECTOMY;  Surgeon: Signe Mitzie LABOR, MD;  Location: [facility];  Service: General;  Laterality: N/A;   UPPER GI ENDOSCOPY N/A 07/17/2022   Procedure: UPPER GI ENDOSCOPY;  Surgeon: Signe Mitzie LABOR, MD;  Location: [facility];  Service: General;  Laterality: N/A;   UTERINE FIBROID EMBOLIZATION     WISDOM TOOTH EXTRACTION      Family History  Problem Relation Age of Onset   Hyperlipidemia Mother    Hypertension Mother    Colon cancer Father 33   Hyperlipidemia Father    Hypertension Father    Prostate cancer Father 23   Heart attack Neg Hx    Stroke Neg Hx     No  Known Allergies  Current Outpatient Medications on File Prior to Visit  Medication Sig Dispense Refill   albuterol  (VENTOLIN  HFA) 108 (90 Base) MCG/ACT inhaler Inhale 2 puffs into the lungs every 6 (six) hours as needed for wheezing or shortness of breath. 6.7 g 0   Azelastine HCl 137 MCG/SPRAY SOLN 1 puff in each nostril Nasally Twice a day for 30 days     cetirizine (ZYRTEC) 10 MG chewable tablet Chew by mouth.     Cholecalciferol (VITAMIN D ) 125 MCG (5000 UT) CAPS Take 5,000 Units by mouth daily.     fluticasone  (FLONASE ) 50 MCG/ACT nasal spray Place 2 sprays into both nostrils daily. 16 g 6   folic acid  (FOLVITE ) 1 MG tablet Take 1 mg by mouth daily.     golimumab  (SIMPONI  ARIA) 50 MG/4ML SOLN injection Inject 50 mg into the vein every 8 (eight) weeks. Dose may change due to weight     hydrOXYzine  (ATARAX ) 25 MG tablet TAKE 1 TABLET BY MOUTH AT BEDTIME AS NEEDED. 30 tablet 1   methotrexate 2.5 MG tablet Take 20 mg by mouth once a week. Saturday (Taking 8 tabs weekly)     pantoprazole  (PROTONIX ) 40 MG tablet Take 1 tablet (40 mg total) by mouth daily. Take this medication daily, regardless of reflux symptoms 90 tablet 0   SUMAtriptan  (IMITREX ) 100 MG tablet TAKE 1 TAB AT ONSET OF MIGRAINE REPEAT IN 2 HRS IF NEEDED 9 tablet 4   B Complex-C  (B-COMPLEX WITH VITAMIN C) tablet Take 1 tablet by mouth daily.     montelukast  (SINGULAIR ) 10 MG tablet 1 tablet Orally Once a day     No current facility-administered medications on file prior to visit.    BP 110/70   Pulse 68   Temp 98.5 F (36.9 C) (Oral)   Ht 5' 1.75 (1.568 m)   Wt 125 lb (56.7 kg)   LMP 10/24/2014   SpO2 99%   BMI 23.05 kg/m       Objective:   Physical Exam Vitals and nursing note reviewed.  Constitutional:      General: She is not in acute distress.    Appearance: Normal appearance. She is not ill-appearing.  HENT:     Head: Normocephalic and atraumatic.     Right Ear: Tympanic membrane, ear canal and external ear normal. There is no impacted cerumen.     Left Ear: Tympanic membrane, ear canal and external ear normal. There is no impacted cerumen.     Nose: Nose normal. No congestion or rhinorrhea.     Mouth/Throat:     Mouth: Mucous membranes are moist.     Pharynx: Oropharynx is clear.  Eyes:     Extraocular Movements: Extraocular movements intact.     Conjunctiva/sclera: Conjunctivae normal.     Pupils: Pupils are equal, round, and reactive to light.  Neck:     Vascular: No carotid bruit.  Cardiovascular:     Rate and Rhythm: Normal rate and regular rhythm.     Pulses: Normal pulses.     Heart sounds: No murmur heard.    No friction rub. No gallop.  Pulmonary:     Effort: Pulmonary effort is normal.     Breath sounds: Normal breath sounds.  Abdominal:     General: Abdomen is flat. Bowel sounds are normal. There is no distension.     Palpations: Abdomen is soft. There is no mass.     Tenderness: There is no abdominal tenderness. There is  no guarding or rebound.     Hernia: No hernia is present.  Musculoskeletal:        General: Normal range of motion.     Cervical back: Normal range of motion and neck supple.  Lymphadenopathy:     Cervical: No cervical adenopathy.  Skin:    General: Skin is warm and dry.     Capillary Refill:  Capillary refill takes less than 2 seconds.  Neurological:     General: No focal deficit present.     Mental Status: She is alert and oriented to person, place, and time.  Psychiatric:        Mood and Affect: Mood normal.        Behavior: Behavior normal.        Thought Content: Thought content normal.        Judgment: Judgment normal.       Assessment & Plan:  1. Routine general medical examination at a health care facility (Primary) Today patient counseled on age appropriate routine health concerns for screening and prevention, each reviewed and up to date or declined. Immunizations reviewed and up to date or declined. Labs ordered and reviewed. Risk factors for depression reviewed and negative. Hearing function and visual acuity are intact. ADLs screened and addressed as needed. Functional ability and level of safety reviewed and appropriate. Education, counseling and referrals performed based on assessed risks today. Patient provided with a copy of personalized plan for preventive services. - Follow up in one year or sooner if needed  2. S/P gastric sleeve procedure - Per gastric surgery  - CBC with Differential/Platelet; Future - Comprehensive metabolic panel with GFR; Future - Lipid panel; Future - TSH; Future  3. History of migraine headaches - Continue with Imitrex  PRN  - CBC with Differential/Platelet; Future - Comprehensive metabolic panel with GFR; Future - Lipid panel; Future - TSH; Future  4. Mild intermittent asthma without complication - Continue with albuterol  inhaler as needed - CBC with Differential/Platelet; Future - Comprehensive metabolic panel with GFR; Future - Lipid panel; Future - TSH; Future  5. Rheumatoid arthritis involving multiple sites, unspecified whether rheumatoid factor present [facility]) - Per rheumatology  - CBC with Differential/Platelet; Future - Comprehensive metabolic panel with GFR; Future - Lipid panel; Future - TSH; Future  6. Mixed  hyperlipidemia - Consider statin  - CBC with Differential/Platelet; Future - Comprehensive metabolic panel with GFR; Future - Lipid panel; Future - TSH; Future  7. Primary insomnia - She does not need to see Pulmonary on a regular basis any longer. I am ok with taking over her prescription for Atarax  - CBC with Differential/Platelet; Future - Comprehensive metabolic panel with GFR; Future - Lipid panel; Future - TSH; Future  8. Gastroesophageal reflux disease without esophagitis - We will take over this prescription when she is due for a refill   Darleene Shape, NP

## 2024-04-24 ENCOUNTER — Ambulatory Visit: Payer: Self-pay | Admitting: Adult Health

## 2024-04-24 LAB — LIPID PANEL
Cholesterol: 206 mg/dL — ABNORMAL HIGH (ref 0–200)
HDL: 81.2 mg/dL (ref >39.00)
LDL Cholesterol: 113 mg/dL — ABNORMAL HIGH (ref 0–99)
NonHDL: 124.67
Total CHOL/HDL Ratio: 3
Triglycerides: 60 mg/dL (ref 0.0–149.0)
VLDL: 12 mg/dL (ref 0.0–40.0)

## 2024-04-24 LAB — CBC WITH DIFFERENTIAL/PLATELET
Basophils Absolute: 0.1 K/uL (ref 0.0–0.1)
Basophils Relative: 1.1 % (ref 0.0–3.0)
Eosinophils Absolute: 0.2 K/uL (ref 0.0–0.7)
Eosinophils Relative: 3 % (ref 0.0–5.0)
HCT: 38.8 % (ref 36.0–46.0)
Hemoglobin: 13 g/dL (ref 12.0–15.0)
Lymphocytes Relative: 42.1 % (ref 12.0–46.0)
Lymphs Abs: 3.4 K/uL (ref 0.7–4.0)
MCHC: 33.4 g/dL (ref 30.0–36.0)
MCV: 99.2 fl (ref 78.0–100.0)
Monocytes Absolute: 0.6 K/uL (ref 0.1–1.0)
Monocytes Relative: 7.3 % (ref 3.0–12.0)
Neutro Abs: 3.7 K/uL (ref 1.4–7.7)
Neutrophils Relative %: 46.5 % (ref 43.0–77.0)
Platelets: 287 K/uL (ref 150.0–400.0)
RBC: 3.91 Mil/uL (ref 3.87–5.11)
RDW: 13.5 % (ref 11.5–15.5)
WBC: 8 K/uL (ref 4.0–10.5)

## 2024-04-24 LAB — COMPREHENSIVE METABOLIC PANEL WITH GFR
ALT: 17 U/L (ref 0–35)
AST: 22 U/L (ref 0–37)
Albumin: 4.2 g/dL (ref 3.5–5.2)
Alkaline Phosphatase: 45 U/L (ref 39–117)
BUN: 22 mg/dL (ref 6–23)
CO2: 27 meq/L (ref 19–32)
Calcium: 8.7 mg/dL (ref 8.4–10.5)
Chloride: 104 meq/L (ref 96–112)
Creatinine, Ser: 0.78 mg/dL (ref 0.40–1.20)
GFR: 85.4 mL/min (ref >60.00)
Glucose, Bld: 77 mg/dL (ref 70–99)
Potassium: 4.2 meq/L (ref 3.5–5.1)
Sodium: 140 meq/L (ref 135–145)
Total Bilirubin: 0.4 mg/dL (ref 0.2–1.2)
Total Protein: 6.6 g/dL (ref 6.0–8.3)

## 2024-04-24 LAB — TSH: TSH: 1.77 u[IU]/mL (ref 0.35–5.50)

## 2024-04-24 MED ORDER — ROSUVASTATIN CALCIUM 5 MG PO TABS: 5.0000 mg | ORAL_TABLET | Freq: Every day | ORAL | 3 refills | Status: AC

## 2024-04-25 ENCOUNTER — Encounter: Payer: Self-pay | Admitting: Adult Health

## 2024-04-30 NOTE — Telephone Encounter (Signed)
 Called pt 2x no answer. Left detailed message advising pt to call and schedule with a new provider.

## 2024-05-01 DIAGNOSIS — F4323 Adjustment disorder with mixed anxiety and depressed mood: Secondary | ICD-10-CM | POA: Diagnosis not present

## 2024-05-02 DIAGNOSIS — R21 Rash and other nonspecific skin eruption: Secondary | ICD-10-CM | POA: Diagnosis not present

## 2024-05-02 DIAGNOSIS — L255 Unspecified contact dermatitis due to plants, except food: Secondary | ICD-10-CM | POA: Diagnosis not present

## 2024-05-21 DIAGNOSIS — F4323 Adjustment disorder with mixed anxiety and depressed mood: Secondary | ICD-10-CM | POA: Diagnosis not present

## 2024-05-30 DIAGNOSIS — F4323 Adjustment disorder with mixed anxiety and depressed mood: Secondary | ICD-10-CM | POA: Diagnosis not present

## 2024-06-04 DIAGNOSIS — M0609 Rheumatoid arthritis without rheumatoid factor, multiple sites: Secondary | ICD-10-CM | POA: Diagnosis not present

## 2024-06-08 ENCOUNTER — Encounter: Payer: Self-pay | Admitting: Adult Health

## 2024-06-10 DIAGNOSIS — Z124 Encounter for screening for malignant neoplasm of cervix: Secondary | ICD-10-CM | POA: Diagnosis not present

## 2024-06-10 DIAGNOSIS — Z1151 Encounter for screening for human papillomavirus (HPV): Secondary | ICD-10-CM | POA: Diagnosis not present

## 2024-06-10 DIAGNOSIS — Z01419 Encounter for gynecological examination (general) (routine) without abnormal findings: Secondary | ICD-10-CM | POA: Diagnosis not present

## 2024-06-10 DIAGNOSIS — Z6823 Body mass index (BMI) 23.0-23.9, adult: Secondary | ICD-10-CM | POA: Diagnosis not present

## 2024-06-10 MED ORDER — HYDROXYZINE HCL 25 MG PO TABS: 25.0000 mg | ORAL_TABLET | Freq: Every evening | ORAL | 1 refills | Status: DC | PRN

## 2024-06-10 NOTE — Telephone Encounter (Signed)
 This medication is being prescribed by Dr.Walsh. ok to send in?

## 2024-06-12 ENCOUNTER — Other Ambulatory Visit: Payer: Self-pay | Admitting: Adult Health

## 2024-06-12 DIAGNOSIS — F4323 Adjustment disorder with mixed anxiety and depressed mood: Secondary | ICD-10-CM | POA: Diagnosis not present

## 2024-06-12 DIAGNOSIS — Z1231 Encounter for screening mammogram for malignant neoplasm of breast: Secondary | ICD-10-CM

## 2024-06-20 DIAGNOSIS — F4323 Adjustment disorder with mixed anxiety and depressed mood: Secondary | ICD-10-CM | POA: Diagnosis not present

## 2024-06-23 ENCOUNTER — Encounter: Payer: Self-pay | Admitting: Adult Health

## 2024-06-24 ENCOUNTER — Ambulatory Visit
Admission: RE | Admit: 2024-06-24 | Discharge: 2024-06-24 | Disposition: A | Source: Ambulatory Visit | Attending: Adult Health

## 2024-06-24 DIAGNOSIS — Z1231 Encounter for screening mammogram for malignant neoplasm of breast: Secondary | ICD-10-CM | POA: Diagnosis not present

## 2024-06-27 DIAGNOSIS — F4323 Adjustment disorder with mixed anxiety and depressed mood: Secondary | ICD-10-CM | POA: Diagnosis not present

## 2024-07-04 DIAGNOSIS — F4323 Adjustment disorder with mixed anxiety and depressed mood: Secondary | ICD-10-CM | POA: Diagnosis not present

## 2024-07-07 ENCOUNTER — Encounter: Payer: Self-pay | Admitting: Radiology

## 2024-07-07 ENCOUNTER — Ambulatory Visit: Admitting: Dietician

## 2024-07-08 DIAGNOSIS — M0609 Rheumatoid arthritis without rheumatoid factor, multiple sites: Secondary | ICD-10-CM | POA: Diagnosis not present

## 2024-07-08 DIAGNOSIS — M79646 Pain in unspecified finger(s): Secondary | ICD-10-CM | POA: Diagnosis not present

## 2024-07-08 DIAGNOSIS — Z79899 Other long term (current) drug therapy: Secondary | ICD-10-CM | POA: Diagnosis not present

## 2024-07-08 DIAGNOSIS — M1990 Unspecified osteoarthritis, unspecified site: Secondary | ICD-10-CM | POA: Diagnosis not present

## 2024-07-17 DIAGNOSIS — F4323 Adjustment disorder with mixed anxiety and depressed mood: Secondary | ICD-10-CM | POA: Diagnosis not present

## 2024-07-24 DIAGNOSIS — F4323 Adjustment disorder with mixed anxiety and depressed mood: Secondary | ICD-10-CM | POA: Diagnosis not present

## 2024-08-07 DIAGNOSIS — F4323 Adjustment disorder with mixed anxiety and depressed mood: Secondary | ICD-10-CM | POA: Diagnosis not present

## 2024-08-09 ENCOUNTER — Other Ambulatory Visit: Payer: Self-pay | Admitting: Adult Health

## 2024-08-11 ENCOUNTER — Encounter: Admitting: Dietician

## 2024-08-21 DIAGNOSIS — F4323 Adjustment disorder with mixed anxiety and depressed mood: Secondary | ICD-10-CM | POA: Diagnosis not present
